# Patient Record
Sex: Female | Born: 1975 | Race: White | Hispanic: No | Marital: Married | Smoking: Never smoker
Health system: Southern US, Community
[De-identification: ages and names within clinical notes are randomized; demographics above are authoritative.]

## PROBLEM LIST (undated history)

## (undated) DIAGNOSIS — F410 Panic disorder [episodic paroxysmal anxiety] without agoraphobia: Secondary | ICD-10-CM

## (undated) DIAGNOSIS — F909 Attention-deficit hyperactivity disorder, unspecified type: Secondary | ICD-10-CM

## (undated) DIAGNOSIS — C50919 Malignant neoplasm of unspecified site of unspecified female breast: Secondary | ICD-10-CM

## (undated) DIAGNOSIS — I499 Cardiac arrhythmia, unspecified: Secondary | ICD-10-CM

## (undated) DIAGNOSIS — F32A Depression, unspecified: Secondary | ICD-10-CM

## (undated) DIAGNOSIS — I10 Essential (primary) hypertension: Secondary | ICD-10-CM

## (undated) DIAGNOSIS — F419 Anxiety disorder, unspecified: Secondary | ICD-10-CM

## (undated) DIAGNOSIS — G43909 Migraine, unspecified, not intractable, without status migrainosus: Secondary | ICD-10-CM

## (undated) DIAGNOSIS — E785 Hyperlipidemia, unspecified: Secondary | ICD-10-CM

## (undated) DIAGNOSIS — E079 Disorder of thyroid, unspecified: Secondary | ICD-10-CM

## (undated) HISTORY — DX: Essential (primary) hypertension: I10

## (undated) HISTORY — DX: Malignant neoplasm of unspecified site of unspecified female breast: C50.919

## (undated) HISTORY — DX: Migraine, unspecified, not intractable, without status migrainosus: G43.909

## (undated) HISTORY — DX: Disorder of thyroid, unspecified: E07.9

## (undated) HISTORY — PX: BREAST SURGERY: SHX581

## (undated) HISTORY — DX: Cardiac arrhythmia, unspecified: I49.9

## (undated) HISTORY — DX: Hyperlipidemia, unspecified: E78.5

---

## 1995-05-23 HISTORY — PX: CHOLECYSTECTOMY: SHX55

## 2016-01-17 ENCOUNTER — Telehealth: Payer: Self-pay | Admitting: Family Medicine

## 2016-01-17 ENCOUNTER — Ambulatory Visit (INDEPENDENT_AMBULATORY_CARE_PROVIDER_SITE_OTHER): Payer: Federal, State, Local not specified - PPO | Admitting: Family Medicine

## 2016-01-17 ENCOUNTER — Encounter: Payer: Self-pay | Admitting: Family Medicine

## 2016-01-17 VITALS — BP 126/82 | HR 101 | Temp 98.4°F | Wt 231.0 lb

## 2016-01-17 DIAGNOSIS — Z13220 Encounter for screening for lipoid disorders: Secondary | ICD-10-CM

## 2016-01-17 DIAGNOSIS — G932 Benign intracranial hypertension: Secondary | ICD-10-CM

## 2016-01-17 DIAGNOSIS — E669 Obesity, unspecified: Secondary | ICD-10-CM

## 2016-01-17 DIAGNOSIS — E039 Hypothyroidism, unspecified: Secondary | ICD-10-CM | POA: Diagnosis not present

## 2016-01-17 LAB — TSH: TSH: 4.11 u[IU]/mL (ref 0.35–4.50)

## 2016-01-17 LAB — COMPREHENSIVE METABOLIC PANEL
ALBUMIN: 4.2 g/dL (ref 3.5–5.2)
ALT: 17 U/L (ref 0–35)
AST: 18 U/L (ref 0–37)
Alkaline Phosphatase: 107 U/L (ref 39–117)
BILIRUBIN TOTAL: 0.2 mg/dL (ref 0.2–1.2)
BUN: 11 mg/dL (ref 6–23)
CALCIUM: 9.2 mg/dL (ref 8.4–10.5)
CHLORIDE: 102 meq/L (ref 96–112)
CO2: 28 mEq/L (ref 19–32)
CREATININE: 0.79 mg/dL (ref 0.40–1.20)
GFR: 84.67 mL/min (ref >60.00)
Glucose, Bld: 123 mg/dL — ABNORMAL HIGH (ref 70–99)
Potassium: 3.6 mEq/L (ref 3.5–5.1)
Sodium: 138 mEq/L (ref 135–145)
Total Protein: 7.8 g/dL (ref 6.0–8.3)

## 2016-01-17 LAB — CBC
HCT: 43.1 % (ref 36.0–46.0)
Hemoglobin: 14.8 g/dL (ref 12.0–15.0)
MCHC: 34.3 g/dL (ref 30.0–36.0)
MCV: 87.5 fl (ref 78.0–100.0)
Platelets: 389 10*3/uL (ref 150.0–400.0)
RBC: 4.93 Mil/uL (ref 3.87–5.11)
RDW: 13.3 % (ref 11.5–15.5)
WBC: 8.8 10*3/uL (ref 4.0–10.5)

## 2016-01-17 LAB — LIPID PANEL
CHOLESTEROL: 201 mg/dL — AB (ref 0–200)
HDL: 51.1 mg/dL (ref >39.00)
NonHDL: 149.62
TRIGLYCERIDES: 253 mg/dL — AB (ref 0.0–149.0)
Total CHOL/HDL Ratio: 4
VLDL: 50.6 mg/dL — ABNORMAL HIGH (ref 0.0–40.0)

## 2016-01-17 LAB — LDL CHOLESTEROL, DIRECT: LDL DIRECT: 121 mg/dL

## 2016-01-17 LAB — HEMOGLOBIN A1C: Hgb A1c MFr Bld: 5.1 % (ref 4.6–6.5)

## 2016-01-17 MED ORDER — PHENTERMINE HCL 37.5 MG PO CAPS: 37.5000 mg | ORAL_CAPSULE | Freq: Every day | ORAL | 0 refills | Status: DC

## 2016-01-17 NOTE — Assessment & Plan Note (Signed)
New problem (to me). Referring to Ophthalmology for detailed look at fundus. Continuing Diamox.

## 2016-01-17 NOTE — Telephone Encounter (Signed)
Pt was called to ask for clarification on family medical history.

## 2016-01-17 NOTE — Patient Instructions (Signed)
Continue your current medications.  Follow up in 3 months.  Take care  Dr. Lacinda Axon   Health Maintenance, Female Adopting a healthy lifestyle and getting preventive care can go a long way to promote health and wellness. Talk with your health care provider about what schedule of regular examinations is right for you. This is a good chance for you to check in with your provider about disease prevention and staying healthy. In between checkups, there are plenty of things you can do on your own. Experts have done a lot of research about which lifestyle changes and preventive measures are most likely to keep you healthy. Ask your health care provider for more information. WEIGHT AND DIET  Eat a healthy diet  Be sure to include plenty of vegetables, fruits, low-fat dairy products, and lean protein.  Do not eat a lot of foods high in solid fats, added sugars, or salt.  Get regular exercise. This is one of the most important things you can do for your health.  Most adults should exercise for at least 150 minutes each week. The exercise should increase your heart rate and make you sweat (moderate-intensity exercise).  Most adults should also do strengthening exercises at least twice a week. This is in addition to the moderate-intensity exercise.  Maintain a healthy weight  Body mass index (BMI) is a measurement that can be used to identify possible weight problems. It estimates body fat based on height and weight. Your health care provider can help determine your BMI and help you achieve or maintain a healthy weight.  For females 98 years of age and older:   A BMI below 18.5 is considered underweight.  A BMI of 18.5 to 24.9 is normal.  A BMI of 25 to 29.9 is considered overweight.  A BMI of 30 and above is considered obese.  Watch levels of cholesterol and blood lipids  You should start having your blood tested for lipids and cholesterol at 42 years of age, then have this test every 5  years.  You may need to have your cholesterol levels checked more often if:  Your lipid or cholesterol levels are high.  You are older than 42 years of age.  You are at high risk for heart disease.  CANCER SCREENING   Lung Cancer  Lung cancer screening is recommended for adults 61-46 years old who are at high risk for lung cancer because of a history of smoking.  A yearly low-dose CT scan of the lungs is recommended for people who:  Currently smoke.  Have quit within the past 15 years.  Have at least a 30-pack-year history of smoking. A pack year is smoking an average of one pack of cigarettes a day for 1 year.  Yearly screening should continue until it has been 15 years since you quit.  Yearly screening should stop if you develop a health problem that would prevent you from having lung cancer treatment.  Breast Cancer  Practice breast self-awareness. This means understanding how your breasts normally appear and feel.  It also means doing regular breast self-exams. Let your health care provider know about any changes, no matter how small.  If you are in your 20s or 30s, you should have a clinical breast exam (CBE) by a health care provider every 1-3 years as part of a regular health exam.  If you are 4 or older, have a CBE every year. Also consider having a breast X-ray (mammogram) every year.  If you have  a family history of breast cancer, talk to your health care provider about genetic screening.  If you are at high risk for breast cancer, talk to your health care provider about having an MRI and a mammogram every year.  Breast cancer gene (BRCA) assessment is recommended for women who have family members with BRCA-related cancers. BRCA-related cancers include:  Breast.  Ovarian.  Tubal.  Peritoneal cancers.  Results of the assessment will determine the need for genetic counseling and BRCA1 and BRCA2 testing. Cervical Cancer Your health care provider may  recommend that you be screened regularly for cancer of the pelvic organs (ovaries, uterus, and vagina). This screening involves a pelvic examination, including checking for microscopic changes to the surface of your cervix (Pap test). You may be encouraged to have this screening done every 3 years, beginning at age 21.  For women ages 30-65, health care providers may recommend pelvic exams and Pap testing every 3 years, or they may recommend the Pap and pelvic exam, combined with testing for human papilloma virus (HPV), every 5 years. Some types of HPV increase your risk of cervical cancer. Testing for HPV may also be done on women of any age with unclear Pap test results.  Other health care providers may not recommend any screening for nonpregnant women who are considered low risk for pelvic cancer and who do not have symptoms. Ask your health care provider if a screening pelvic exam is right for you.  If you have had past treatment for cervical cancer or a condition that could lead to cancer, you need Pap tests and screening for cancer for at least 20 years after your treatment. If Pap tests have been discontinued, your risk factors (such as having a new sexual partner) need to be reassessed to determine if screening should resume. Some women have medical problems that increase the chance of getting cervical cancer. In these cases, your health care provider may recommend more frequent screening and Pap tests. Colorectal Cancer  This type of cancer can be detected and often prevented.  Routine colorectal cancer screening usually begins at 42 years of age and continues through 42 years of age.  Your health care provider may recommend screening at an earlier age if you have risk factors for colon cancer.  Your health care provider may also recommend using home test kits to check for hidden blood in the stool.  A small camera at the end of a tube can be used to examine your colon directly  (sigmoidoscopy or colonoscopy). This is done to check for the earliest forms of colorectal cancer.  Routine screening usually begins at age 50.  Direct examination of the colon should be repeated every 5-10 years through 42 years of age. However, you may need to be screened more often if early forms of precancerous polyps or small growths are found. Skin Cancer  Check your skin from head to toe regularly.  Tell your health care provider about any new moles or changes in moles, especially if there is a change in a mole's shape or color.  Also tell your health care provider if you have a mole that is larger than the size of a pencil eraser.  Always use sunscreen. Apply sunscreen liberally and repeatedly throughout the day.  Protect yourself by wearing long sleeves, pants, a wide-brimmed hat, and sunglasses whenever you are outside. HEART DISEASE, DIABETES, AND HIGH BLOOD PRESSURE   High blood pressure causes heart disease and increases the risk of stroke. High   blood pressure is more likely to develop in:  People who have blood pressure in the high end of the normal range (130-139/85-89 mm Hg).  People who are overweight or obese.  People who are African American.  If you are 18-39 years of age, have your blood pressure checked every 3-5 years. If you are 40 years of age or older, have your blood pressure checked every year. You should have your blood pressure measured twice--once when you are at a hospital or clinic, and once when you are not at a hospital or clinic. Record the average of the two measurements. To check your blood pressure when you are not at a hospital or clinic, you can use:  An automated blood pressure machine at a pharmacy.  A home blood pressure monitor.  If you are between 55 years and 79 years old, ask your health care provider if you should take aspirin to prevent strokes.  Have regular diabetes screenings. This involves taking a blood sample to check your  fasting blood sugar level.  If you are at a normal weight and have a low risk for diabetes, have this test once every three years after 42 years of age.  If you are overweight and have a high risk for diabetes, consider being tested at a younger age or more often. PREVENTING INFECTION  Hepatitis B  If you have a higher risk for hepatitis B, you should be screened for this virus. You are considered at high risk for hepatitis B if:  You were born in a country where hepatitis B is common. Ask your health care provider which countries are considered high risk.  Your parents were born in a high-risk country, and you have not been immunized against hepatitis B (hepatitis B vaccine).  You have HIV or AIDS.  You use needles to inject street drugs.  You live with someone who has hepatitis B.  You have had sex with someone who has hepatitis B.  You get hemodialysis treatment.  You take certain medicines for conditions, including cancer, organ transplantation, and autoimmune conditions. Hepatitis C  Blood testing is recommended for:  Everyone born from 1945 through 1965.  Anyone with known risk factors for hepatitis C. Sexually transmitted infections (STIs)  You should be screened for sexually transmitted infections (STIs) including gonorrhea and chlamydia if:  You are sexually active and are younger than 42 years of age.  You are older than 42 years of age and your health care provider tells you that you are at risk for this type of infection.  Your sexual activity has changed since you were last screened and you are at an increased risk for chlamydia or gonorrhea. Ask your health care provider if you are at risk.  If you do not have HIV, but are at risk, it may be recommended that you take a prescription medicine daily to prevent HIV infection. This is called pre-exposure prophylaxis (PrEP). You are considered at risk if:  You are sexually active and do not regularly use condoms or  know the HIV status of your partner(s).  You take drugs by injection.  You are sexually active with a partner who has HIV. Talk with your health care provider about whether you are at high risk of being infected with HIV. If you choose to begin PrEP, you should first be tested for HIV. You should then be tested every 3 months for as long as you are taking PrEP.  PREGNANCY   If you are   If you are premenopausal and you may become pregnant, ask your health care provider about preconception counseling.  If you may become pregnant, take 400 to 800 micrograms (mcg) of folic acid every day.  If you want to prevent pregnancy, talk to your health care provider about birth control (contraception). OSTEOPOROSIS AND MENOPAUSE   Osteoporosis is a disease in which the bones lose minerals and strength with aging. This can result in serious bone fractures. Your risk for osteoporosis can be identified using a bone density scan.  If you are 65 years of age or older, or if you are at risk for osteoporosis and fractures, ask your health care provider if you should be screened.  Ask your health care provider whether you should take a calcium or vitamin D supplement to lower your risk for osteoporosis.  Menopause may have certain physical symptoms and risks.  Hormone replacement therapy may reduce some of these symptoms and risks. Talk to your health care provider about whether hormone replacement therapy is right for you.  HOME CARE INSTRUCTIONS   Schedule regular health, dental, and eye exams.  Stay current with your immunizations.   Do not use any tobacco products including cigarettes, chewing tobacco, or electronic cigarettes.  If you are pregnant, do not drink alcohol.  If you are breastfeeding, limit how much and how often you drink alcohol.  Limit alcohol intake to no more than 1 drink per day for nonpregnant women. One drink equals 12 ounces of beer, 5 ounces of wine, or 1 ounces of hard  liquor.  Do not use street drugs.  Do not share needles.  Ask your health care provider for help if you need support or information about quitting drugs.  Tell your health care provider if you often feel depressed.  Tell your health care provider if you have ever been abused or do not feel safe at home.   This information is not intended to replace advice given to you by your health care provider. Make sure you discuss any questions you have with your health care provider.   Document Released: 11/21/2010 Document Revised: 05/29/2014 Document Reviewed: 04/09/2013 Elsevier Interactive Patient Education 2016 Elsevier Inc.  

## 2016-01-17 NOTE — Assessment & Plan Note (Addendum)
Stable. TSH today. Continuing current synthroid dose (100 mcg).

## 2016-01-17 NOTE — Progress Notes (Signed)
Subjective:  Patient ID: Cassidy Hopkins, female    DOB: 10-27-1975  Age: 40 y.o. MRN: [id number]  CC: Establish care, New patient visit  HPI Cassidy Hopkins is a 40 y.o. female presents to the clinic today to establish care.  Issues are below.  Hypothyroidism  Has been stable on 100 mcg of Synthroid.  Needs labs today.  Pseudotumor cerebri  Patient reports a long-standing history of pseudotumor.  She states that it is managed fairly well with acetazolamide.  She states that recently she has had change in her vision. She states that her vision is worse than previous.  She continues to have intermittent headaches.  She states that she saw an ophthalmologist approximately year ago.  Obesity  Patient states that she continues to battle obesity.  She has lost a significant amount of weight but is still not at her goal of 200 pounds.  She was previously on phentermine with a good response. She was on this for 12 weeks.  She would like to discuss restarting today.  PMH, Surgical Hx, Family Hx, Social History reviewed and updated as below.  Past Medical History:  Diagnosis Date  . Migraines   . Thyroid disease    Past Surgical History:  Procedure Laterality Date  . CESAREAN SECTION  2009, 2014, 2016  . CHOLECYSTECTOMY  1997   Family History  Problem Relation Age of Onset  . Hypertension Mother   . Hyperlipidemia Mother   . Hyperlipidemia Father   . Heart disease Father   . Hypertension Father   . Diabetes Father   . Colon cancer Maternal Grandmother   . Hyperlipidemia Maternal Grandmother    Social History  Substance Use Topics  . Smoking status: Never Smoker  . Smokeless tobacco: Never Used  . Alcohol use No   Review of Systems  Eyes: Positive for visual disturbance.  Neurological: Positive for headaches.  All other systems reviewed and are negative.  Objective:   Today's Vitals: BP 126/82 (BP Location: Right Arm, Patient Position: Sitting, Cuff Size:  Normal)   Pulse (!) 101   Temp 98.4 F (36.9 C) (Oral)   Wt 231 lb (104.8 kg)   SpO2 100%   Physical Exam  Constitutional: She is oriented to person, place, and time.  Obese female in NAD.   HENT:  Head: Normocephalic and atraumatic.  Mouth/Throat: Oropharynx is clear and moist.  Eyes: Conjunctivae are normal. Right eye exhibits no discharge. Left eye exhibits no discharge.  Neck: Neck supple. No thyromegaly present.  Cardiovascular: Normal rate and regular rhythm.   Pulmonary/Chest: Effort normal. She has no wheezes. She has no rales.  Abdominal: Soft. She exhibits no distension. There is no tenderness. There is no rebound and no guarding.  Musculoskeletal: Normal range of motion. She exhibits no edema.  Neurological: She is alert and oriented to person, place, and time.  Skin: Skin is warm and dry. No rash noted.  Psychiatric: She has a normal mood and affect.  Vitals reviewed.  Assessment & Plan:   Problem List Items Addressed This Visit    Hypothyroidism - Primary    Stable. TSH today. Continuing current synthroid dose (100 mcg).      Relevant Medications   levothyroxine (SYNTHROID, LEVOTHROID) 100 MCG tablet   Other Relevant Orders   TSH   Pseudotumor cerebri    New problem (to me). Referring to Ophthalmology for detailed look at fundus. Continuing Diamox.      Relevant Medications   acetaZOLAMIDE (DIAMOX) 250 MG  tablet   hydrochlorothiazide (HYDRODIURIL) 25 MG tablet   Other Relevant Orders   Ambulatory referral to Ophthalmology   CBC   Comp Met (CMET)   HgB A1c   Obesity    New problem (to me). After lengthy discussion, will proceed with additional 12 weeks of phentermine. Advised healthy diet and exercise. Patient has joined [employer].      Relevant Medications   phentermine 37.5 MG capsule    Other Visit Diagnoses    Screening, lipid       Relevant Orders   Lipid Profile      Outpatient Encounter Prescriptions as of 01/17/2016  Medication  Sig  . acetaZOLAMIDE (DIAMOX) 250 MG tablet TAKE ONE TABLET (250 MG TOTAL) BY MOUTH DAILY.  . cetirizine (ZYRTEC) 10 MG tablet Take by mouth.  . hydrochlorothiazide (HYDRODIURIL) 25 MG tablet TAKE ONE TABLET (25 MG TOTAL) BY MOUTH DAILY.  Marland Kitchen levothyroxine (SYNTHROID, LEVOTHROID) 100 MCG tablet Take by mouth.  . phentermine 37.5 MG capsule Take 1 capsule (37.5 mg total) by mouth daily.   No facility-administered encounter medications on file as of 01/17/2016.     Follow-up: 3 months.  [place]

## 2016-01-17 NOTE — Assessment & Plan Note (Signed)
New problem (to me). After lengthy discussion, will proceed with additional 12 weeks of phentermine. Advised healthy diet and exercise. Patient has joined [employer].

## 2016-01-18 ENCOUNTER — Other Ambulatory Visit: Payer: Self-pay | Admitting: Family Medicine

## 2016-01-18 MED ORDER — LEVOTHYROXINE SODIUM 112 MCG PO TABS: 112.0000 ug | ORAL_TABLET | Freq: Every day | ORAL | 0 refills | Status: DC

## 2016-01-21 DIAGNOSIS — J029 Acute pharyngitis, unspecified: Secondary | ICD-10-CM | POA: Diagnosis not present

## 2016-01-21 DIAGNOSIS — R030 Elevated blood-pressure reading, without diagnosis of hypertension: Secondary | ICD-10-CM | POA: Diagnosis not present

## 2016-01-21 DIAGNOSIS — R358 Other polyuria: Secondary | ICD-10-CM | POA: Diagnosis not present

## 2016-01-25 ENCOUNTER — Encounter: Payer: Self-pay | Admitting: Family Medicine

## 2016-02-16 ENCOUNTER — Other Ambulatory Visit: Payer: Self-pay | Admitting: Ophthalmology

## 2016-02-16 DIAGNOSIS — H4710 Unspecified papilledema: Secondary | ICD-10-CM

## 2016-02-16 DIAGNOSIS — G932 Benign intracranial hypertension: Secondary | ICD-10-CM | POA: Diagnosis not present

## 2016-03-31 ENCOUNTER — Encounter: Payer: Self-pay | Admitting: Family Medicine

## 2016-03-31 ENCOUNTER — Ambulatory Visit (INDEPENDENT_AMBULATORY_CARE_PROVIDER_SITE_OTHER): Payer: Federal, State, Local not specified - PPO | Admitting: Family Medicine

## 2016-03-31 VITALS — BP 130/94 | HR 92 | Temp 97.5°F | Resp 14 | Ht 69.0 in | Wt 240.2 lb

## 2016-03-31 DIAGNOSIS — E039 Hypothyroidism, unspecified: Secondary | ICD-10-CM

## 2016-03-31 DIAGNOSIS — F411 Generalized anxiety disorder: Secondary | ICD-10-CM

## 2016-03-31 DIAGNOSIS — E669 Obesity, unspecified: Secondary | ICD-10-CM

## 2016-03-31 LAB — T4, FREE: FREE T4: 1 ng/dL (ref 0.8–1.8)

## 2016-03-31 LAB — TSH: TSH: 2.49 m[IU]/L

## 2016-03-31 MED ORDER — BUPROPION HCL ER (XL) 150 MG PO TB24: 150.0000 mg | ORAL_TABLET | Freq: Every day | ORAL | 0 refills | Status: DC

## 2016-03-31 NOTE — Assessment & Plan Note (Signed)
Worsening. Encouraged continued exercise and healthy balanced diet (low carb, higher protein). Wellbutrin for anxiety (hopefully will aid in weight loss as well).

## 2016-03-31 NOTE — Progress Notes (Signed)
Pre visit review using our clinic review tool, if applicable. No additional management support is needed unless otherwise documented below in the visit note. 

## 2016-03-31 NOTE — Patient Instructions (Signed)
Try the Wellbutrin and see how you do.  Send a message in ~ 6 weeks.  Take care  Dr. Lacinda Axon

## 2016-03-31 NOTE — Progress Notes (Signed)
Subjective:  Patient ID: Cassidy Hopkins, female    DOB: Nov 28, 1975  Age: 40 y.o. MRN: [id number]  CC: Anxiety, Obesity  HPI:  40 year old female with a history of pseudotumor, obesity, hypothyroidism presents with the above complaints/issues.  Anxiety  Patient reports a long-standing history of anxiety. She has never reported this to me.  She has never had this addressed.  She states that she feels anxious on a regular basis.  She states that her siblings suffer from anxiety as well.  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  She would like to discuss treatment options today.  Obesity  At our last visit, patient discussed with me the possibility of starting phentermine. I was in agreement for short course given her positive response in the past.  She did not take the medication as she was concerned about side effects.  She states that she is gaining weight and still struggling.   She is exercising at a local gym 3 times a week.  She states that she often makes bad choices regarding food intake. This seems to be the likely culprit for her weight gain.  She reports associated joint pain.  She would like to discuss alternate treatment options today.  Social Hx   Social History   Social History  . Marital status: Married    Spouse name: N/A  . Number of children: N/A  . Years of education: N/A   Social History Main Topics  . Smoking status: Never Smoker  . Smokeless tobacco: Never Used  . Alcohol use No  . Drug use: No  . Sexual activity: Not Asked   Other Topics Concern  . None   Social History Narrative  . None    Review of Systems  Constitutional:       Weight gain.  Psychiatric/Behavioral: The patient is nervous/anxious.    Objective:  BP (!) 130/94 (BP Location: Right Arm, Patient Position: Sitting, Cuff Size: Large)   Pulse 92   Temp 97.5 F (36.4 C) (Oral)   Resp 14   Wt 240 lb 4  oz (109 kg)   SpO2 98%   BP/Weight 03/31/2016 0000000  Systolic BP [id number]  Diastolic BP 94 82  Wt. (Lbs) 240.25 231    Physical Exam  Constitutional: She is oriented to person, place, and time. She appears well-developed. No distress.  Cardiovascular: Normal rate and regular rhythm.   Pulmonary/Chest: Effort normal and breath sounds normal.  Neurological: She is alert and oriented to person, place, and time.  Psychiatric: She has a normal mood and affect.  Vitals reviewed.  Lab Results  Component Value Date   WBC 8.8 01/17/2016   HGB 14.8 01/17/2016   HCT 43.1 01/17/2016   PLT 389.0 01/17/2016   GLUCOSE 123 (H) 01/17/2016   CHOL 201 (H) 01/17/2016   TRIG 253.0 (H) 01/17/2016   HDL 51.10 01/17/2016   LDLDIRECT 121.0 01/17/2016   ALT 17 01/17/2016   AST 18 01/17/2016   NA 138 01/17/2016   K 3.6 01/17/2016   CL 102 01/17/2016   CREATININE 0.79 01/17/2016   BUN 11 01/17/2016   CO2 28 01/17/2016   TSH 4.11 01/17/2016   HGBA1C 5.1 01/17/2016   Assessment & Plan:   Problem List Items Addressed This Visit    Obesity (BMI 30-39.9)    Worsening. Encouraged continued exercise and healthy balanced diet (low carb, higher protein). Wellbutrin for anxiety (hopefully will aid in weight  loss as well).      Hypothyroidism   Relevant Orders   TSH   T4, free   Generalized anxiety disorder - Primary    New problem. After discussion, patient elected to proceed with pharmacologic treatment. Starting Wellbutrin as it may aid in weight loss as well.        Meds ordered this encounter  Medications  . buPROPion (WELLBUTRIN XL) 150 MG 24 hr tablet    Sig: Take 1 tablet (150 mg total) by mouth daily. May increase to 300 mg after 1 week.    Dispense:  90 tablet    Refill:  0    Follow-up: Return in about 6 months (around 09/28/2016).  Start

## 2016-03-31 NOTE — Assessment & Plan Note (Signed)
New problem. After discussion, patient elected to proceed with pharmacologic treatment. Starting Wellbutrin as it may aid in weight loss as well.

## 2016-04-04 ENCOUNTER — Telehealth: Payer: Self-pay | Admitting: *Deleted

## 2016-04-04 ENCOUNTER — Ambulatory Visit: Payer: Federal, State, Local not specified - PPO

## 2016-04-04 NOTE — Telephone Encounter (Signed)
Patient requested lab results Pt contact  [phone]

## 2016-04-04 NOTE — Telephone Encounter (Signed)
Pt called with results

## 2016-04-14 ENCOUNTER — Other Ambulatory Visit: Payer: Self-pay | Admitting: Family Medicine

## 2016-04-22 DIAGNOSIS — J0100 Acute maxillary sinusitis, unspecified: Secondary | ICD-10-CM | POA: Diagnosis not present

## 2016-04-22 DIAGNOSIS — R0982 Postnasal drip: Secondary | ICD-10-CM | POA: Diagnosis not present

## 2016-05-01 ENCOUNTER — Telehealth: Payer: Self-pay | Admitting: Family Medicine

## 2016-05-01 NOTE — Telephone Encounter (Signed)
Pt stated you all talked about contrave.

## 2016-05-01 NOTE — Telephone Encounter (Signed)
What medication is she referring to?

## 2016-05-01 NOTE — Telephone Encounter (Signed)
Recommendations?

## 2016-05-01 NOTE — Telephone Encounter (Signed)
Pt saw Dr. Lacinda Axon last month for anxiety/weight loss. Pt was put on buPROPion (WELLBUTRIN XL) 150 MG 24 hr tablet. Anxiety is fine but would like to try the other medication Dr. Lacinda Axon suggested. Please call pt at [phone].

## 2016-05-02 ENCOUNTER — Telehealth: Payer: Self-pay | Admitting: Family Medicine

## 2016-05-02 ENCOUNTER — Other Ambulatory Visit: Payer: Self-pay | Admitting: Family Medicine

## 2016-05-02 MED ORDER — NALTREXONE-BUPROPION HCL ER 8-90 MG PO TB12: ORAL_TABLET | ORAL | 0 refills | Status: DC

## 2016-05-02 NOTE — Telephone Encounter (Signed)
Pt called and told to pick up rx at the front.

## 2016-05-02 NOTE — Progress Notes (Signed)
refilled 

## 2016-05-02 NOTE — Telephone Encounter (Signed)
Stop Wellbutrin. She can pick up Rx for Contrave.

## 2016-05-02 NOTE — Telephone Encounter (Signed)
The patient called to see if there was a cheaper alternative for contrave . She went to pick up her prescription the pharmacy and it was 150.00 for a one month supply.

## 2016-05-02 NOTE — Telephone Encounter (Signed)
Pt was called and todl that alternatives would still be expensive. Pt understood and stated she would just continue wellbutrin.

## 2016-05-12 ENCOUNTER — Other Ambulatory Visit: Payer: Self-pay | Admitting: Family Medicine

## 2016-06-19 ENCOUNTER — Other Ambulatory Visit: Payer: Self-pay | Admitting: Family Medicine

## 2016-06-19 DIAGNOSIS — K080 Exfoliation of teeth due to systemic causes: Secondary | ICD-10-CM | POA: Diagnosis not present

## 2016-06-28 ENCOUNTER — Telehealth: Payer: Self-pay | Admitting: Family Medicine

## 2016-06-28 NOTE — Telephone Encounter (Signed)
I approve

## 2016-06-28 NOTE — Telephone Encounter (Signed)
Patient would like to switch her [place] PCP from [facility], to Dr. Briscoe Deutscher of [place].   Please reply to confirm that the patients requested can be satisfied.   She want's to schedule with Dr. Juleen China for next week Tuesday the 13th of February.  -LL

## 2016-06-29 NOTE — Telephone Encounter (Signed)
Ok with me 

## 2016-06-29 NOTE — Telephone Encounter (Signed)
Good day.  Ms. Morine just called back today to try to schedule an appt w/ Dr. Juleen China at [facility].  I let her know we are pending acknowledgement from Dr. Lacinda Axon.  Thanks.  -LL

## 2016-07-04 ENCOUNTER — Encounter: Payer: Self-pay | Admitting: Family Medicine

## 2016-07-04 ENCOUNTER — Ambulatory Visit (INDEPENDENT_AMBULATORY_CARE_PROVIDER_SITE_OTHER): Payer: Federal, State, Local not specified - PPO | Admitting: Family Medicine

## 2016-07-04 VITALS — BP 114/82 | HR 83 | Temp 97.5°F | Ht 69.0 in | Wt 241.2 lb

## 2016-07-04 DIAGNOSIS — G932 Benign intracranial hypertension: Secondary | ICD-10-CM

## 2016-07-04 DIAGNOSIS — E039 Hypothyroidism, unspecified: Secondary | ICD-10-CM | POA: Diagnosis not present

## 2016-07-04 DIAGNOSIS — D179 Benign lipomatous neoplasm, unspecified: Secondary | ICD-10-CM | POA: Diagnosis not present

## 2016-07-04 DIAGNOSIS — E669 Obesity, unspecified: Secondary | ICD-10-CM

## 2016-07-04 DIAGNOSIS — Z9851 Tubal ligation status: Secondary | ICD-10-CM

## 2016-07-04 LAB — LIPID PANEL
Cholesterol: 220 mg/dL — ABNORMAL HIGH (ref 0–200)
HDL: 56.6 mg/dL (ref >39.00)
LDL Cholesterol: 133 mg/dL — ABNORMAL HIGH (ref 0–99)
NonHDL: 163.28
Total CHOL/HDL Ratio: 4
Triglycerides: 150 mg/dL — ABNORMAL HIGH (ref 0.0–149.0)
VLDL: 30 mg/dL (ref 0.0–40.0)

## 2016-07-04 LAB — TSH: TSH: 6.5 u[IU]/mL — ABNORMAL HIGH (ref 0.35–4.50)

## 2016-07-04 LAB — COMPREHENSIVE METABOLIC PANEL
ALT: 15 U/L (ref 0–35)
AST: 22 U/L (ref 0–37)
Albumin: 4.2 g/dL (ref 3.5–5.2)
Alkaline Phosphatase: 102 U/L (ref 39–117)
BUN: 13 mg/dL (ref 6–23)
CO2: 26 mEq/L (ref 19–32)
Calcium: 9.3 mg/dL (ref 8.4–10.5)
Chloride: 104 mEq/L (ref 96–112)
Creatinine, Ser: 0.75 mg/dL (ref 0.40–1.20)
GFR: 89.71 mL/min (ref >60.00)
Glucose, Bld: 92 mg/dL (ref 70–99)
Potassium: 3.3 mEq/L — ABNORMAL LOW (ref 3.5–5.1)
Sodium: 137 mEq/L (ref 135–145)
Total Bilirubin: 0.5 mg/dL (ref 0.2–1.2)
Total Protein: 7.4 g/dL (ref 6.0–8.3)

## 2016-07-04 LAB — CBC WITH DIFFERENTIAL/PLATELET
Basophils Absolute: 0.1 10*3/uL (ref 0.0–0.1)
Basophils Relative: 1 % (ref 0.0–3.0)
Eosinophils Absolute: 0.3 10*3/uL (ref 0.0–0.7)
Eosinophils Relative: 4.5 % (ref 0.0–5.0)
HCT: 44.1 % (ref 36.0–46.0)
Hemoglobin: 14.9 g/dL (ref 12.0–15.0)
Lymphocytes Relative: 28.9 % (ref 12.0–46.0)
Lymphs Abs: 1.9 10*3/uL (ref 0.7–4.0)
MCHC: 33.8 g/dL (ref 30.0–36.0)
MCV: 87.5 fl (ref 78.0–100.0)
Monocytes Absolute: 0.4 10*3/uL (ref 0.1–1.0)
Monocytes Relative: 5.6 % (ref 3.0–12.0)
Neutro Abs: 3.9 10*3/uL (ref 1.4–7.7)
Neutrophils Relative %: 60 % (ref 43.0–77.0)
Platelets: 327 10*3/uL (ref 150.0–400.0)
RBC: 5.04 Mil/uL (ref 3.87–5.11)
RDW: 13.3 % (ref 11.5–15.5)
WBC: 6.5 10*3/uL (ref 4.0–10.5)

## 2016-07-04 LAB — T4, FREE: Free T4: 0.79 ng/dL (ref 0.60–1.60)

## 2016-07-04 MED ORDER — PHENTERMINE HCL 37.5 MG PO TABS: 37.5000 mg | ORAL_TABLET | Freq: Every day | ORAL | 0 refills | Status: DC

## 2016-07-04 NOTE — Patient Instructions (Signed)
Start taking vitamin D 4000 IU daily.  Start taking B12 daily.

## 2016-07-04 NOTE — Progress Notes (Signed)
Pre visit review using our clinic review tool, if applicable. No additional management support is needed unless otherwise documented below in the visit note. 

## 2016-07-06 ENCOUNTER — Encounter: Payer: Self-pay | Admitting: Family Medicine

## 2016-07-06 DIAGNOSIS — Z9851 Tubal ligation status: Secondary | ICD-10-CM | POA: Insufficient documentation

## 2016-07-06 NOTE — Progress Notes (Addendum)
Cassidy Hopkins is a 41 y.o. female is here to [facility].   History of Present Illness:    1. Obesity (BMI 30-39.9). Patient has noted a weight gain of approximately 40-60 pounds over the last a few years. She feels that her ideal weight is 160 pounds.     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 Exercise: Gym 3x/week.  Diet: "Out of control," daytime grazer, craves sugar. Quit sugar 2 months ago.   Past History for Obesity: History of eating disorders: none. There is a family history positive for obesity in the patient.  Previous treatments for obesity include nutritionist consultation, prescription appetite suppressants: Contrave, Phentermine, self-directed dieting and very low calorie diet.   Obesity associated medical conditions: thyroid disease. Obesity associated medications: none. Cardiovascular risk factors besides obesity: FamHx DM, HTN.  Body mass index is 35.62 kg/m. Ht:5\' 9"  (1.753 m) Wt:241 lb 3.2 oz (109.4 kg) [id number] surface area is 2.31 meters squared.     PMHx, SurgHx, SocialHx, Medications, and Allergies were reviewed in the Visit Navigator and updated as appropriate.    Past Medical History:  Diagnosis Date  . Breast cancer ([place]) 1997  . Hyperlipidemia   . Hypertension   . Irregular heart rate   . Migraines   . Thyroid disease     Past Surgical History:  Procedure Laterality Date  . BREAST SURGERY    . CESAREAN SECTION  2009, 2014, 2016  . CHOLECYSTECTOMY  1997    Family History  Problem Relation Age of Onset  . Hypertension Mother   . Hyperlipidemia Mother   . Hyperlipidemia Father   . Heart disease Father   . Hypertension Father   . Diabetes Father   . Colon cancer Maternal Grandmother   . Hyperlipidemia Maternal Grandmother     Social History  Substance Use Topics  . Smoking status: Never Smoker  . Smokeless tobacco: Never Used  . Alcohol use No     Current Medications and Allergies:    Current Outpatient  Prescriptions:  .  acetaZOLAMIDE (DIAMOX) 250 MG tablet, TAKE ONE TABLET (250 MG TOTAL) BY MOUTH DAILY., Disp: , Rfl:  .  cetirizine (ZYRTEC) 10 MG tablet, Take by mouth., Disp: , Rfl:  .  hydrochlorothiazide (HYDRODIURIL) 25 MG tablet, TAKE ONE TABLET (25 MG TOTAL) BY MOUTH DAILY., Disp: , Rfl:  .  levothyroxine (SYNTHROID, LEVOTHROID) 112 MCG tablet, TAKE 1 TABLET (112 MCG TOTAL) BY MOUTH DAILY., Disp: 90 tablet, Rfl: 3    Allergies  Allergen Reactions  . Penicillins Rash and Shortness Of Breath  . Topiramate Anxiety      Patient Information Form: Screening and ROS    Do you feel safe in relationships? yes PHQ-2: negative  Review of Systems  General:  Negative for unexplained weight loss, fever Skin: Negative for new or changing mole, sore that won't heal HEENT: Negative for trouble hearing, trouble seeing, ringing in ears, mouth sores, hoarseness, change in voice, dysphagia CV:  Negative for chest pain, dyspnea, edema, palpitations Resp: Negative for cough, dyspnea, hemoptysis GI: Negative for nausea, vomiting, diarrhea, constipation, abdominal pain, melena, hematochezia GU: Negative for dysuria, incontinence, urinary hesitance, hematuria, vaginal or penile discharge, polyuria, sexual difficulty, lumps in testicle or breasts MSK: Negative for muscle cramps or aches, joint pain or swelling Neuro: Negative for headaches, weakness, numbness, dizziness, passing out/fainting Psych: Negative for depression, anxiety, memory problems   Vitals:   Vitals:   07/04/16 1004  BP: 114/82  Pulse: 83  Temp: 97.5 F (36.4 C)  TempSrc: Oral  SpO2: 98%  Weight: 241 lb 3.2 oz (109.4 kg)  Height: 5\' 9"  (1.753 m)     Body mass index is 35.62 kg/m.   Physical Exam:     General: Alert, cooperative, appears stated age and no distress.  HEENT:  Normocephalic, without obvious abnormality, atraumatic. Conjunctivae/corneas clear. PERRL, EOM's intact. Normal TM's and external ear canals  both ears. Nares normal. Septum midline. Mucosa normal. No drainage or sinus tenderness. Lips, mucosa, and tongue normal; teeth and gums normal.  Lungs: Clear to auscultation bilaterally.  Heart:: Regular rate and rhythm, S1, S2 normal, no murmur, click, rub or gallop.  Abdomen: Soft, non-tender; bowel sounds normal; no masses,  no organomegaly.  Extremities: Extremities normal, atraumatic, no cyanosis or edema.  Pulses: 2+ and symmetric.  Skin: Skin color, texture, turgor normal. No rashes or lesions.  Neurologic: Alert and oriented X 3, normal strength and tone. Normal symmetric. reflexes. Normal coordination and gait.  Psych: Alert,oriented, in NAD with a full range of affect, normal behavior and no psychotic features    Results for orders placed or performed in visit on 07/04/16  CBC with Differential/Platelet  Result Value Ref Range   WBC 6.5 4.0 - 10.5 K/uL   RBC 5.04 3.87 - 5.11 Mil/uL   Hemoglobin 14.9 12.0 - 15.0 g/dL   HCT 44.1 36.0 - 46.0 %   MCV 87.5 78.0 - 100.0 fl   MCHC 33.8 30.0 - 36.0 g/dL   RDW 13.3 11.5 - 15.5 %   Platelets 327.0 150.0 - 400.0 K/uL   Neutrophils Relative % 60.0 43.0 - 77.0 %   Lymphocytes Relative 28.9 12.0 - 46.0 %   Monocytes Relative 5.6 3.0 - 12.0 %   Eosinophils Relative 4.5 0.0 - 5.0 %   Basophils Relative 1.0 0.0 - 3.0 %   Neutro Abs 3.9 1.4 - 7.7 K/uL   Lymphs Abs 1.9 0.7 - 4.0 K/uL   Monocytes Absolute 0.4 0.1 - 1.0 K/uL   Eosinophils Absolute 0.3 0.0 - 0.7 K/uL   Basophils Absolute 0.1 0.0 - 0.1 K/uL  Comprehensive metabolic panel  Result Value Ref Range   Sodium 137 135 - 145 mEq/L   Potassium 3.3 (L) 3.5 - 5.1 mEq/L   Chloride 104 96 - 112 mEq/L   CO2 26 19 - 32 mEq/L   Glucose, Bld 92 70 - 99 mg/dL   BUN 13 6 - 23 mg/dL   Creatinine, Ser 0.75 0.40 - 1.20 mg/dL   Total Bilirubin 0.5 0.2 - 1.2 mg/dL   Alkaline Phosphatase 102 39 - 117 U/L   AST 22 0 - 37 U/L   ALT 15 0 - 35 U/L   Total Protein 7.4 6.0 - 8.3 g/dL   Albumin  4.2 3.5 - 5.2 g/dL   Calcium 9.3 8.4 - 10.5 mg/dL   GFR 89.71 >60.00 mL/min  Lipid panel  Result Value Ref Range   Cholesterol 220 (H) 0 - 200 mg/dL   Triglycerides 150.0 (H) 0.0 - 149.0 mg/dL   HDL 56.60 >39.00 mg/dL   VLDL 30.0 0.0 - 40.0 mg/dL   LDL Cholesterol 133 (H) 0 - 99 mg/dL   Total CHOL/HDL Ratio 4    NonHDL 163.28   T4, free  Result Value Ref Range   Free T4 0.79 0.60 - 1.60 ng/dL  TSH  Result Value Ref Range   TSH 6.50 (H) 0.35 - 4.50 uIU/mL  Assessment and Plan:    Cassidy Hopkins was seen today for establish care.  Diagnoses and all orders for this visit:  Obesity (BMI 30-39.9) Comments: Reviewed weight loss options at length. Patient would like to start with Phentermine and self-directed exercise/diet changes. Will recheck in one month.  Orders: -     phentermine (ADIPEX-P) 37.5 MG tablet; Take 1 tablet (37.5 mg total) by mouth daily before breakfast. -     Insulin, Free (Bioactive) -     CBC with Differential/Platelet -     Comprehensive metabolic panel -     Lipid panel  Acquired hypothyroidism -     T4, free -     TSH  . Reviewed expectations re: course of current medical issues. . Discussed self-management of symptoms. . Outlined signs and symptoms indicating need for more acute intervention. . Patient verbalized understanding and all questions were answered. . See orders for this visit as documented in the electronic medical record. . Patient received an After Visit Summary.  Records requested if needed. I spent 45 minutes with this patient, greater than 50% was face-to-face time counseling regarding the above diagnoses.   Briscoe Deutscher, [place], [place] 07/06/2016   Follow-up: No Follow-up on file.  Meds ordered this encounter  Medications  . phentermine (ADIPEX-P) 37.5 MG tablet    Sig: Take 1 tablet (37.5 mg total) by mouth daily before breakfast.    Dispense:  30 tablet    Refill:  0   Medications Discontinued During This  Encounter  Medication Reason  . Naltrexone-Bupropion HCl ER 8-90 MG TB12 Error  . buPROPion (WELLBUTRIN XL) 150 MG 24 hr tablet    Orders Placed This Encounter  Procedures  . CBC with Differential/Platelet  . Comprehensive metabolic panel  . Lipid panel  . T4, free  . TSH  . Insulin, Free (Bioactive)

## 2016-07-07 ENCOUNTER — Encounter: Payer: Self-pay | Admitting: Family Medicine

## 2016-07-07 ENCOUNTER — Other Ambulatory Visit: Payer: Self-pay

## 2016-07-07 ENCOUNTER — Telehealth: Payer: Self-pay | Admitting: Surgical

## 2016-07-07 DIAGNOSIS — D179 Benign lipomatous neoplasm, unspecified: Secondary | ICD-10-CM | POA: Insufficient documentation

## 2016-07-07 LAB — INSULIN, FREE (BIOACTIVE): Insulin, Free: 6.8 u[IU]/mL (ref 1.5–14.9)

## 2016-07-07 MED ORDER — SERTRALINE HCL 50 MG PO TABS: 50.0000 mg | ORAL_TABLET | Freq: Every evening | ORAL | 3 refills | Status: DC

## 2016-07-07 NOTE — Telephone Encounter (Signed)
LM for patient to return call to discuss my chart message.

## 2016-07-07 NOTE — Telephone Encounter (Signed)
Spoke with patient and she said that she is having a lot of anxiety right now. She said that she is not wanting to be in social situations. I advised that Dr. Juleen China discussed putting her on Zoloft. I also informed her that it can cause drowsiness and she should be careful if driving.

## 2016-07-08 NOTE — Telephone Encounter (Signed)
Spoke with Water quality scientist, [place] - okay to call in [place]. Completed on 07/07/16. Will check in at next appointment (already scheduled for 3-4 weeks).

## 2016-07-31 ENCOUNTER — Other Ambulatory Visit: Payer: Self-pay | Admitting: Family Medicine

## 2016-07-31 DIAGNOSIS — E669 Obesity, unspecified: Secondary | ICD-10-CM

## 2016-07-31 MED ORDER — PHENTERMINE HCL 37.5 MG PO TABS: 37.5000 mg | ORAL_TABLET | Freq: Every day | ORAL | 0 refills | Status: DC

## 2016-08-01 ENCOUNTER — Ambulatory Visit: Payer: Federal, State, Local not specified - PPO | Admitting: Family Medicine

## 2016-08-02 ENCOUNTER — Ambulatory Visit (INDEPENDENT_AMBULATORY_CARE_PROVIDER_SITE_OTHER): Payer: Federal, State, Local not specified - PPO | Admitting: Family Medicine

## 2016-08-02 ENCOUNTER — Encounter: Payer: Self-pay | Admitting: Family Medicine

## 2016-08-02 VITALS — BP 124/84 | HR 100 | Temp 97.9°F | Ht 69.0 in | Wt 236.0 lb

## 2016-08-02 DIAGNOSIS — E669 Obesity, unspecified: Secondary | ICD-10-CM

## 2016-08-02 DIAGNOSIS — F411 Generalized anxiety disorder: Secondary | ICD-10-CM

## 2016-08-02 MED ORDER — PHENTERMINE HCL 37.5 MG PO TABS: 37.5000 mg | ORAL_TABLET | Freq: Every day | ORAL | 0 refills | Status: DC

## 2016-08-02 MED ORDER — BUSPIRONE HCL 7.5 MG PO TABS: 7.5000 mg | ORAL_TABLET | Freq: Two times a day (BID) | ORAL | 2 refills | Status: DC

## 2016-08-02 NOTE — Patient Instructions (Signed)
Keep up the good work!  Buspar - start with one pill once daily for at least one week to see how it makes you feel. Then, you may increase to twice daily.

## 2016-08-02 NOTE — Progress Notes (Signed)
Cassidy Hopkins is a 41 y.o. female is here to discuss:  History of Present Illness:   Chief Complaint  Patient presents with  . Follow-up  . Weight Check    Patient is on Phentermine. Tolerating well.  Has lost 5 pounds in one month.   HPI:  1. Obesity (BMI 30-39.9): Tolerating medication without side effects. Following low carbohydrate diet 50% of the time. No exercise right now.  Under a lot of stress right now.    2. Generalized anxiety disorder: Tried Zoloft. Had hallucinations. Significant stress in her life now. Daughter and mother with health issues. No SI/HI.    There are no preventive care reminders to display for this patient.  PMHx, SurgHx, SocialHx, FamHx, Medications, and Allergies were reviewed in the Visit Navigator and updated as appropriate.   Patient Active Problem List   Diagnosis Date Noted  . Multiple lipomas 07/07/2016  . Hx of tubal ligation 07/06/2016  . Generalized anxiety disorder 03/31/2016  . Hypothyroidism 01/17/2016  . Pseudotumor cerebri 01/17/2016  . Obesity (BMI 30-39.9) 01/17/2016    Social History  Substance Use Topics  . Smoking status: Never Smoker  . Smokeless tobacco: Never Used  . Alcohol use No    Current Medications and Allergies:   .  acetaZOLAMIDE (DIAMOX) 250 MG tablet, TAKE ONE TABLET (250 MG TOTAL) BY MOUTH DAILY., Disp: , Rfl:  .  cetirizine (ZYRTEC) 10 MG tablet, Take by mouth., Disp: , Rfl:  .  hydrochlorothiazide (HYDRODIURIL) 25 MG tablet, TAKE ONE TABLET (25 MG TOTAL) BY MOUTH DAILY., Disp: , Rfl:  .  levothyroxine (SYNTHROID, LEVOTHROID) 112 MCG tablet, TAKE 1 TABLET (112 MCG TOTAL) BY MOUTH DAILY., Disp: 90 tablet, Rfl: 3 .  phentermine (ADIPEX-P) 37.5 MG tablet, Take 1 tablet (37.5 mg total) by mouth daily before breakfast., Disp: 30 tablet, Rfl: 0   Allergies  Allergen Reactions  . Penicillins Rash and Shortness Of Breath  . Topiramate Anxiety    Review of Systems   Review of Systems  Constitutional:  Negative for chills and fever.  HENT: Negative for ear pain, sinus pain and sore throat.   Eyes: Negative for blurred vision and double vision.  Respiratory: Negative for cough and shortness of breath.   Cardiovascular: Negative for chest pain, palpitations and leg swelling.  Gastrointestinal: Negative for abdominal pain, diarrhea, nausea and vomiting.  Genitourinary: Negative for dysuria.  Musculoskeletal: Negative for joint pain and neck pain.  Neurological: Negative for dizziness, weakness and headaches.  Psychiatric/Behavioral: Negative for depression, hallucinations and memory loss.    Vitals:   Vitals:   08/02/16 0941  BP: 124/84  Pulse: 100  Temp: 97.9 F (36.6 C)  TempSrc: Oral  SpO2: 96%  Weight: 236 lb (107 kg)  Height: 5\' 9"  (1.753 m)     Body mass index is 34.85 kg/m.   Physical Exam:    Physical Exam  Constitutional: She appears well-developed and well-nourished. No distress.  Eyes: Pupils are equal, round, and reactive to light.  Neck: Neck supple. No JVD present. No thyromegaly present.  Cardiovascular: Normal rate, regular rhythm, normal heart sounds and intact distal pulses.   Pulmonary/Chest: Effort normal and breath sounds normal.  Abdominal: Soft.  Neurological: She is alert.  Skin: Skin is warm and dry.  Psychiatric: She has a normal mood and affect. Her behavior is normal. Judgment and thought content normal.     Assessment and Plan:    Cassidy Hopkins was seen today for follow-up and weight check.  Diagnoses and all orders for this visit:  Obesity (BMI 30-39.9) Comments: Doing well. Lost five pounds this month. Mnimal exercise. Orders: -     Referral to Nutrition and Diabetes Services -     phentermine (ADIPEX-P) 37.5 MG tablet; Take 1 tablet (37.5 mg total) by mouth daily before breakfast.  Generalized anxiety disorder Comments: Hallucinations with Zoloft.  Orders: -     busPIRone (BUSPAR) 7.5 MG tablet; Take 1 tablet (7.5 mg total) by mouth  2 (two) times daily.   . Reviewed expectations re: course of current medical issues. . Discussed self-management of symptoms. . Outlined signs and symptoms indicating need for more acute intervention. . Patient verbalized understanding and all questions were answered. . See orders for this visit as documented in the electronic medical record. . Patient received an After Visit Summary.  [name], CMA, acting as Education administrator.  CMA served as Education administrator during this visit. History, Physical, and Plan performed by medical provider. Documentation and orders reviewed and attested to. Briscoe Deutscher, D.O.   Briscoe Deutscher, [place], [place] 08/02/2016  Follow-up: 2 months

## 2016-08-02 NOTE — Progress Notes (Signed)
Pre visit review using our clinic review tool, if applicable. No additional management support is needed unless otherwise documented below in the visit note. 

## 2016-08-03 ENCOUNTER — Ambulatory Visit: Payer: Federal, State, Local not specified - PPO | Admitting: Family Medicine

## 2016-08-04 ENCOUNTER — Encounter: Payer: Self-pay | Admitting: Family Medicine

## 2016-08-07 ENCOUNTER — Telehealth: Payer: Self-pay

## 2016-08-07 NOTE — Telephone Encounter (Signed)
Spoke with patient and outlined all suggestions for anxiety management.  Also sent her a MyChart message with all details.  Patient would like to go the "no medicine route" at this time.  States she will research the suggested supplements as well.    She is interested in going to therapy and would like to know if Dr. Juleen China has any recommendations for a therapist?.  She would prefer a female.

## 2016-08-08 NOTE — Telephone Encounter (Signed)
Sent message to patient via My Chart recommending she call the office and schedule an appointment with Trey Paula.

## 2016-08-29 ENCOUNTER — Ambulatory Visit: Payer: Federal, State, Local not specified - PPO | Admitting: Dietician

## 2016-09-03 DIAGNOSIS — J988 Other specified respiratory disorders: Secondary | ICD-10-CM | POA: Diagnosis not present

## 2016-09-03 DIAGNOSIS — H9202 Otalgia, left ear: Secondary | ICD-10-CM | POA: Diagnosis not present

## 2016-10-01 ENCOUNTER — Other Ambulatory Visit: Payer: Self-pay | Admitting: Family Medicine

## 2016-10-01 DIAGNOSIS — E669 Obesity, unspecified: Secondary | ICD-10-CM

## 2016-10-02 ENCOUNTER — Encounter: Payer: Self-pay | Admitting: Surgical

## 2016-10-02 MED ORDER — PHENTERMINE HCL 37.5 MG PO TABS: 37.5000 mg | ORAL_TABLET | Freq: Every day | ORAL | 0 refills | Status: DC

## 2016-10-03 ENCOUNTER — Telehealth: Payer: Self-pay | Admitting: Family Medicine

## 2016-10-03 ENCOUNTER — Ambulatory Visit: Payer: Federal, State, Local not specified - PPO | Admitting: Dietician

## 2016-10-03 MED ORDER — HYDROCHLOROTHIAZIDE 25 MG PO TABS: ORAL_TABLET | ORAL | 0 refills | Status: DC

## 2016-10-03 NOTE — Telephone Encounter (Signed)
RX sent to pharmacy  

## 2016-10-03 NOTE — Telephone Encounter (Signed)
Patient needs a refill on the rx hydrochlorothiazide (HYDRODIURIL) 25 MG tablet.  [id number] [place], [place] (Phone) [phone] (Fax)   Please call and advise patient once this is placed.

## 2016-10-03 NOTE — Addendum Note (Signed)
Addended by: Durwin Glaze on: 10/03/2016 12:59 PM   Modules accepted: Orders

## 2016-10-10 ENCOUNTER — Ambulatory Visit: Payer: Federal, State, Local not specified - PPO | Admitting: Family Medicine

## 2016-10-13 ENCOUNTER — Telehealth: Payer: Self-pay | Admitting: Family Medicine

## 2016-10-13 MED ORDER — ACETAZOLAMIDE 250 MG PO TABS: 250.0000 mg | ORAL_TABLET | Freq: Once | ORAL | 2 refills | Status: DC

## 2016-10-13 NOTE — Telephone Encounter (Signed)
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 Is it ok to refill this?

## 2016-10-13 NOTE — Telephone Encounter (Signed)
RX sent to pharmacy  

## 2016-10-13 NOTE — Telephone Encounter (Signed)
Okay to fill? 

## 2016-10-13 NOTE — Telephone Encounter (Signed)
Patient requests refill on acetaZOLAMIDE (DIAMOX) 250 MG tablet  [id number] [address], [place] [phone] (Phone) [phone] (Fax)   Please call patient once rx has been placed.

## 2016-10-18 ENCOUNTER — Ambulatory Visit: Payer: Federal, State, Local not specified - PPO | Admitting: Family Medicine

## 2016-10-24 ENCOUNTER — Encounter: Payer: Self-pay | Admitting: Family Medicine

## 2016-10-24 ENCOUNTER — Ambulatory Visit (INDEPENDENT_AMBULATORY_CARE_PROVIDER_SITE_OTHER): Payer: Federal, State, Local not specified - PPO | Admitting: Family Medicine

## 2016-10-24 VITALS — BP 136/84 | HR 74 | Temp 98.1°F | Ht 69.0 in | Wt 233.2 lb

## 2016-10-24 DIAGNOSIS — F411 Generalized anxiety disorder: Secondary | ICD-10-CM

## 2016-10-24 DIAGNOSIS — M722 Plantar fascial fibromatosis: Secondary | ICD-10-CM

## 2016-10-24 DIAGNOSIS — M5432 Sciatica, left side: Secondary | ICD-10-CM

## 2016-10-24 DIAGNOSIS — E669 Obesity, unspecified: Secondary | ICD-10-CM

## 2016-10-24 MED ORDER — PHENTERMINE HCL 37.5 MG PO TABS: 37.5000 mg | ORAL_TABLET | Freq: Every day | ORAL | 0 refills | Status: DC

## 2016-10-24 MED ORDER — LORAZEPAM 0.5 MG PO TABS: 0.5000 mg | ORAL_TABLET | Freq: Two times a day (BID) | ORAL | 0 refills | Status: DC | PRN

## 2016-10-24 NOTE — Progress Notes (Signed)
Cassidy Hopkins is a 41 y.o. female is here for follow up.  History of Present Illness:   Cassidy Hopkins CMA acting as scribe for Dr. Juleen China.  HPI:  1. Generalized anxiety disorder.  She has the following anxiety symptoms: racing thoughts. Onset of symptoms was approximately several weeks ago.  Symptoms have been waxing and waning since that time. She denies current suicidal and homicidal ideation. Family history significant for anxiety.Risk factors: negative life event - mother recently diagnosed with dementia. Previous treatment includes medication BuSpar and Zoloft.   She complains of the following medication side effects: dizziness and hallucinations.   2. Obesity (BMI 30-39.9). Using phentermine without side-effects. Down three pounds.    3. Left sided sciatica. Symptoms have been present for a few months. No trauma. Pain in left low back with radiation down left leg. Mild intermittent numbness.    4. Plantar fasciitis of left foot. Left heel. A few months. Tried ice, NSAIDs. Pain is worst in the morning. No trauma.   There are no preventive care reminders to display for this patient.  PMHx, SurgHx, SocialHx, FamHx, Medications, and Allergies were reviewed in the Visit Navigator and updated as appropriate.   Patient Active Problem List   Diagnosis Date Noted  . Multiple lipomas 07/07/2016  . Hx of tubal ligation 07/06/2016  . Generalized anxiety disorder 03/31/2016  . Hypothyroidism 01/17/2016  . Pseudotumor cerebri 01/17/2016  . Obesity (BMI 30-39.9) 01/17/2016   Social History  Substance Use Topics  . Smoking status: Never Smoker  . Smokeless tobacco: Never Used  . Alcohol use No   Current Medications and Allergies:   .  cetirizine (ZYRTEC) 10 MG tablet, Take by mouth., Disp: , Rfl:  .  hydrochlorothiazide (HYDRODIURIL) 25 MG tablet, TAKE ONE TABLET (25 MG TOTAL) BY MOUTH DAILY., Disp: 30 tablet, Rfl: 0 .  levothyroxine (SYNTHROID, LEVOTHROID) 112 MCG tablet, TAKE 1  TABLET (112 MCG TOTAL) BY MOUTH DAILY., Disp: 90 tablet, Rfl: 3 .  phentermine (ADIPEX-P) 37.5 MG tablet, Take 1 tablet (37.5 mg total) by mouth daily before breakfast., Disp: 30 tablet, Rfl: 0 .  acetaZOLAMIDE (DIAMOX) 250 MG tablet, Take 1 tablet (250 mg total) by mouth once., Disp: 30 tablet, Rfl: 2  Allergies  Allergen Reactions  . Penicillins Rash and Shortness Of Breath  . Topiramate Anxiety   Review of Systems   Review of Systems  Constitutional: Negative for chills, fever and malaise/fatigue.  HENT: Negative for ear pain, sinus pain and sore throat.   Eyes: Negative for blurred vision and double vision.  Respiratory: Negative for cough, shortness of breath and wheezing.   Cardiovascular: Negative for chest pain, palpitations and leg swelling.  Gastrointestinal: Negative for abdominal pain, nausea and vomiting.  Musculoskeletal: Positive for back pain. Negative for joint pain and neck pain.       Lower back pain.   Neurological: Negative for dizziness and headaches.  Psychiatric/Behavioral: Negative for depression, hallucinations and memory loss.   Vitals:   Vitals:   10/24/16 1034  BP: 136/84  Pulse: 74  Temp: 98.1 F (36.7 C)  TempSrc: Oral  SpO2: 97%  Weight: 233 lb 3.2 oz (105.8 kg)  Height: 5\' 9"  (1.753 m)     Body mass index is 34.44 kg/m.   Physical Exam:   Physical Exam  Constitutional: She appears well-developed and well-nourished. No distress.  HENT:  Head: Normocephalic and atraumatic.  Eyes: EOM are normal. Pupils are equal, round, and reactive to light.  Neck: Normal  range of motion. Neck supple.  Cardiovascular: Normal rate, regular rhythm, normal heart sounds and intact distal pulses.   Pulmonary/Chest: Effort normal.  Abdominal: Soft.  Musculoskeletal:       Back:  TTP left plantar fascia.  Skin: Skin is warm.  Psychiatric: She has a normal mood and affect. Her behavior is normal.  Nursing note and vitals reviewed.   Assessment and  Plan:   Cassidy Hopkins was seen today for follow-up.  Diagnoses and all orders for this visit:  Generalized anxiety disorder Comments: Okay low dose Ativan to be used sparingly. I expect this Rx to last at least 6 months. Orders: -     LORazepam (ATIVAN) 0.5 MG tablet; Take 1 tablet (0.5 mg total) by mouth 2 (two) times daily as needed for anxiety.  Obesity (BMI 30-39.9) Comments: Reviewed weight loss options at length. She feels most comfortable with phentermine plus weight watchers. Offered support.  Orders: -     phentermine (ADIPEX-P) 37.5 MG tablet; Take 1 tablet (37.5 mg total) by mouth daily before breakfast. -     phentermine (ADIPEX-P) 37.5 MG tablet; Take 1 tablet (37.5 mg total) by mouth daily before breakfast. -     phentermine (ADIPEX-P) 37.5 MG tablet; Take 1 tablet (37.5 mg total) by mouth daily before breakfast.  Left sided sciatica Comments: Reviewed NSAIDs, stretches. To Cassidy Hopkins if not improving.   Plantar fasciitis of left foot Comments: Reviewed NSAIDs, stretches. Discussed proper shoe-wear. To Cassidy Hopkins if not improving.    . Reviewed expectations re: course of current medical issues. . Discussed self-management of symptoms. . Outlined signs and symptoms indicating need for more acute intervention. . Patient verbalized understanding and all questions were answered. Marland Kitchen Health Maintenance issues including appropriate healthy diet, exercise, and smoking avoidance were discussed with patient. . See orders for this visit as documented in the electronic medical record. . Patient received an After Visit Summary.  CMA served as Education administrator during this visit. History, Physical, and Plan performed by medical provider. The above documentation has been reviewed and is accurate and complete. Briscoe Deutscher, D.O.  Briscoe Deutscher, DO [place], [place] 10/24/2016  Future Appointments Date Time Provider [place]  01/23/2017 11:30 AM Briscoe Deutscher, DO [facility] None

## 2016-10-30 ENCOUNTER — Other Ambulatory Visit: Payer: Self-pay | Admitting: Internal Medicine

## 2016-10-30 ENCOUNTER — Other Ambulatory Visit: Payer: Self-pay | Admitting: Family Medicine

## 2016-10-31 MED ORDER — HYDROCHLOROTHIAZIDE 25 MG PO TABS: ORAL_TABLET | ORAL | 0 refills | Status: DC

## 2016-11-20 ENCOUNTER — Encounter: Payer: Self-pay | Admitting: Family Medicine

## 2016-11-29 ENCOUNTER — Other Ambulatory Visit: Payer: Self-pay | Admitting: Family Medicine

## 2016-11-29 ENCOUNTER — Encounter: Payer: Self-pay | Admitting: Family Medicine

## 2016-12-15 ENCOUNTER — Other Ambulatory Visit: Payer: Self-pay | Admitting: Family Medicine

## 2016-12-18 ENCOUNTER — Other Ambulatory Visit: Payer: Self-pay | Admitting: Family Medicine

## 2017-01-02 ENCOUNTER — Ambulatory Visit: Payer: Federal, State, Local not specified - PPO | Admitting: Sports Medicine

## 2017-01-02 ENCOUNTER — Other Ambulatory Visit: Payer: Self-pay | Admitting: Family Medicine

## 2017-01-02 DIAGNOSIS — F411 Generalized anxiety disorder: Secondary | ICD-10-CM

## 2017-01-02 NOTE — Telephone Encounter (Signed)
Please advise on refill.

## 2017-01-03 NOTE — Telephone Encounter (Signed)
Refill called in to patient's pharmacy. 

## 2017-01-12 NOTE — Telephone Encounter (Signed)
error 

## 2017-01-17 ENCOUNTER — Other Ambulatory Visit: Payer: Self-pay | Admitting: Family Medicine

## 2017-01-17 DIAGNOSIS — E669 Obesity, unspecified: Secondary | ICD-10-CM

## 2017-01-18 ENCOUNTER — Other Ambulatory Visit: Payer: Self-pay

## 2017-01-18 ENCOUNTER — Telehealth: Payer: Self-pay | Admitting: Family Medicine

## 2017-01-18 DIAGNOSIS — E669 Obesity, unspecified: Secondary | ICD-10-CM

## 2017-01-18 MED ORDER — PHENTERMINE HCL 37.5 MG PO TABS: 37.5000 mg | ORAL_TABLET | Freq: Every day | ORAL | 0 refills | Status: DC

## 2017-01-18 NOTE — Telephone Encounter (Signed)
Please advise.  Patient has appointment scheduled on 01/23/17.  States she has 1 tablet left.

## 2017-01-18 NOTE — Telephone Encounter (Signed)
Refill faxed to patient's pharmacy.

## 2017-01-18 NOTE — Telephone Encounter (Signed)
Okay refill. 

## 2017-01-18 NOTE — Telephone Encounter (Signed)
MEDICATION: phentermine (ADIPEX-P) 37.5 MG tablet  PHARMACY:    [id number] [address], [place] - [place] [phone] (Phone) [phone] (Fax)     IS THIS A 90 DAY SUPPLY : N   IS PATIENT OUT OF MEDICATION: N  IF NOT; HOW MUCH IS LEFT: 1  LAST APPOINTMENT DATE: 10/24/16  NEXT APPOINTMENT DATE: 01/23/17  OTHER COMMENTS:    **Let patient know to contact pharmacy at the end of the day to make sure medication is ready. **  ** Please notify patient to allow 48-72 hours to process**  **Encourage patient to contact the pharmacy for refills or they can request refills through [facility]**

## 2017-01-23 ENCOUNTER — Encounter: Payer: Self-pay | Admitting: Family Medicine

## 2017-01-23 ENCOUNTER — Ambulatory Visit (INDEPENDENT_AMBULATORY_CARE_PROVIDER_SITE_OTHER): Payer: Federal, State, Local not specified - PPO | Admitting: Family Medicine

## 2017-01-23 VITALS — BP 120/90 | HR 104 | Ht 69.0 in | Wt 222.0 lb

## 2017-01-23 DIAGNOSIS — E669 Obesity, unspecified: Secondary | ICD-10-CM | POA: Diagnosis not present

## 2017-01-23 DIAGNOSIS — M25512 Pain in left shoulder: Secondary | ICD-10-CM | POA: Diagnosis not present

## 2017-01-23 DIAGNOSIS — F411 Generalized anxiety disorder: Secondary | ICD-10-CM | POA: Diagnosis not present

## 2017-01-23 DIAGNOSIS — F4312 Post-traumatic stress disorder, chronic: Secondary | ICD-10-CM | POA: Diagnosis not present

## 2017-01-23 MED ORDER — PHENTERMINE HCL 37.5 MG PO TABS: 37.5000 mg | ORAL_TABLET | Freq: Every day | ORAL | 2 refills | Status: DC

## 2017-01-23 MED ORDER — CLONAZEPAM 0.5 MG PO TABS: 0.5000 mg | ORAL_TABLET | Freq: Two times a day (BID) | ORAL | 1 refills | Status: DC | PRN

## 2017-01-23 MED ORDER — DICLOFENAC SODIUM 2 % TD SOLN: 1.0000 "application " | Freq: Two times a day (BID) | TRANSDERMAL | 0 refills | Status: AC

## 2017-01-23 NOTE — Progress Notes (Signed)
Cassidy Hopkins is a 41 y.o. female is here for follow up.  History of Present Illness:   Cassidy Hopkins, cma is acting as a Education administrator for [employer], DO.  HPI:  Generalized Anxiety Disorder This has been an ongoing for several years now. Her main stressers are due to her mothers illness with dementia and parenting 4 children. Takes Lorazepam 0.5mg  bid with relief. She starts counseling tonight with [facility] with a Holiday representative. She is very anxious about opening up but feels she is ready. Daily walks and church are a few things that help relieve her stress. She denies current suicidal and homicidal ideation.   Obesity Doing well. Weight watchers. Tolerating phentermine. Wants to continue.  Wt Readings from Last 3 Encounters:  01/23/17 222 lb (100.7 kg)  10/24/16 233 lb 3.2 oz (105.8 kg)  08/02/16 236 lb (107 kg)   Shoulder Pain Patient complains of left shoulder pain. The symptoms began several days ago. Aggravating factors: injury while lifting baby carrier. Pain is located diffusely throughout the shoulder. Discomfort is described as aching. Symptoms are exacerbated by repetitive movements, overhead movements and lying on the shoulder. Evaluation to date: none. Therapy to date includes: rest.  PMHx, SurgHx, SocialHx, FamHx, Medications, and Allergies were reviewed in the Visit Navigator and updated as appropriate.   Patient Active Problem List   Diagnosis Date Noted  . Multiple lipomas 07/07/2016  . Hx of tubal ligation 07/06/2016  . Generalized anxiety disorder 03/31/2016  . Hypothyroidism 01/17/2016  . Pseudotumor cerebri 01/17/2016  . Obesity (BMI 30-39.9) 01/17/2016   Social History  Substance Use Topics  . Smoking status: Never Smoker  . Smokeless tobacco: Never Used  . Alcohol use No   Current Medications and Allergies:   Current Outpatient Prescriptions:  .  acetaZOLAMIDE (DIAMOX) 250 MG tablet, TAKE 1 TABLET BY MOUTH EVERY DAY, Disp: 30 tablet,  Rfl: 2 .  cetirizine (ZYRTEC) 10 MG tablet, Take by mouth., Disp: , Rfl:  .  hydrochlorothiazide (HYDRODIURIL) 25 MG tablet, TAKE 1 TABLET BY MOUTH EVERY DAY, Disp: 30 tablet, Rfl: 1 .  levothyroxine (SYNTHROID, LEVOTHROID) 112 MCG tablet, TAKE 1 TABLET (112 MCG TOTAL) BY MOUTH DAILY., Disp: 90 tablet, Rfl: 3 .  LORazepam (ATIVAN) 0.5 MG tablet, TAKE 1 TABLET BY MOUTH TWICE A DAY AS NEEDED ANXIETY, Disp: 60 tablet, Rfl: 0 .  Multiple Vitamins-Minerals (HAIR SKIN AND NAILS FORMULA PO), Take by mouth., Disp: , Rfl:  .  phentermine (ADIPEX-P) 37.5 MG tablet, Take 1 tablet (37.5 mg total) by mouth daily before breakfast., Disp: 30 tablet, Rfl: 0  Allergies  Allergen Reactions  . Penicillins Rash and Shortness Of Breath  . Topiramate Anxiety   Review of Systems   Pertinent items are noted in the HPI. Otherwise, ROS is negative.  Vitals:   Vitals:   01/23/17 1152  BP: 120/90  Pulse: (!) 104  SpO2: 99%  Weight: 222 lb (100.7 kg)  Height: 5\' 9"  (1.753 m)     Body mass index is 32.78 kg/m.   Physical Exam:   Physical Exam  Constitutional: She appears well-developed and well-nourished. No distress.  HENT:  Head: Normocephalic and atraumatic.  Eyes: Pupils are equal, round, and reactive to light. EOM are normal.  Neck: Normal range of motion. Neck supple.  Cardiovascular: Normal rate, regular rhythm, normal heart sounds and intact distal pulses.   Pulmonary/Chest: Effort normal.  Abdominal: Soft.  Musculoskeletal:       Left shoulder: She exhibits decreased  range of motion and tenderness. She exhibits no effusion, no crepitus and normal pulse.  Skin: Skin is warm.  Psychiatric: She has a normal mood and affect. Her behavior is normal.  Nursing note and vitals reviewed.   Assessment and Plan:   Cassidy Hopkins was seen today for 3 month follow up.  Diagnoses and all orders for this visit:  Generalized anxiety disorder Comments: Stable. She is starting therapy tonight. I've asked her  to check in with me to let me know how it went. Continue current treatment.  Orders: -     clonazePAM (KLONOPIN) 0.5 MG tablet; Take 1 tablet (0.5 mg total) by mouth 2 (two) times daily as needed for anxiety.  Obesity (BMI 30-39.9) Comments: Continue making healthy food choices. Continue 1/2 dose Phentermine.  Orders: -     phentermine (ADIPEX-P) 37.5 MG tablet; Take 1 tablet (37.5 mg total) by mouth daily before breakfast.  Acute pain of left shoulder Comments: NEW.  Handout provided with stretches, exercises. Sample of Pennsaid provided. Patient to call if not improving.  Orders: -     Diclofenac Sodium (PENNSAID) 2 % SOLN; Place 1 application onto the skin 2 (two) times daily.   . Reviewed expectations re: course of current medical issues. . Discussed self-management of symptoms. . Outlined signs and symptoms indicating need for more acute intervention. . Patient verbalized understanding and all questions were answered. Marland Kitchen Health Maintenance issues including appropriate healthy diet, exercise, and smoking avoidance were discussed with patient. . See orders for this visit as documented in the electronic medical record. . Patient received an After Visit Summary.  CMA served as Education administrator during this visit. History, Physical, and Plan performed by medical provider. The above documentation has been reviewed and is accurate and complete. Briscoe Deutscher, D.O.  Briscoe Deutscher, DO [place] 01/27/2017  Future Appointments Date Time Provider [place]  04/24/2017 11:00 AM Briscoe Deutscher, DO [facility] None

## 2017-01-25 ENCOUNTER — Encounter: Payer: Self-pay | Admitting: Family Medicine

## 2017-01-25 DIAGNOSIS — M722 Plantar fascial fibromatosis: Secondary | ICD-10-CM | POA: Diagnosis not present

## 2017-01-25 DIAGNOSIS — M79672 Pain in left foot: Secondary | ICD-10-CM | POA: Diagnosis not present

## 2017-01-29 MED ORDER — CYCLOBENZAPRINE HCL 5 MG PO TABS: 5.0000 mg | ORAL_TABLET | Freq: Three times a day (TID) | ORAL | 0 refills | Status: DC | PRN

## 2017-01-30 DIAGNOSIS — F4312 Post-traumatic stress disorder, chronic: Secondary | ICD-10-CM | POA: Diagnosis not present

## 2017-02-06 DIAGNOSIS — F4312 Post-traumatic stress disorder, chronic: Secondary | ICD-10-CM | POA: Diagnosis not present

## 2017-02-13 DIAGNOSIS — F4312 Post-traumatic stress disorder, chronic: Secondary | ICD-10-CM | POA: Diagnosis not present

## 2017-02-27 ENCOUNTER — Other Ambulatory Visit: Payer: Self-pay | Admitting: Family Medicine

## 2017-02-27 DIAGNOSIS — F4312 Post-traumatic stress disorder, chronic: Secondary | ICD-10-CM | POA: Diagnosis not present

## 2017-03-06 DIAGNOSIS — F4312 Post-traumatic stress disorder, chronic: Secondary | ICD-10-CM | POA: Diagnosis not present

## 2017-03-13 DIAGNOSIS — F4312 Post-traumatic stress disorder, chronic: Secondary | ICD-10-CM | POA: Diagnosis not present

## 2017-03-15 DIAGNOSIS — K080 Exfoliation of teeth due to systemic causes: Secondary | ICD-10-CM | POA: Diagnosis not present

## 2017-03-20 ENCOUNTER — Other Ambulatory Visit: Payer: Self-pay | Admitting: Family Medicine

## 2017-03-20 DIAGNOSIS — F411 Generalized anxiety disorder: Secondary | ICD-10-CM

## 2017-03-21 NOTE — Telephone Encounter (Signed)
Okay 

## 2017-03-21 NOTE — Telephone Encounter (Signed)
Please advise on refill.

## 2017-03-27 DIAGNOSIS — F4312 Post-traumatic stress disorder, chronic: Secondary | ICD-10-CM | POA: Diagnosis not present

## 2017-04-03 DIAGNOSIS — F4312 Post-traumatic stress disorder, chronic: Secondary | ICD-10-CM | POA: Diagnosis not present

## 2017-04-06 ENCOUNTER — Other Ambulatory Visit: Payer: Self-pay | Admitting: Family Medicine

## 2017-04-16 ENCOUNTER — Encounter: Payer: Self-pay | Admitting: Family Medicine

## 2017-04-17 DIAGNOSIS — F4312 Post-traumatic stress disorder, chronic: Secondary | ICD-10-CM | POA: Diagnosis not present

## 2017-04-17 DIAGNOSIS — M722 Plantar fascial fibromatosis: Secondary | ICD-10-CM | POA: Diagnosis not present

## 2017-04-20 ENCOUNTER — Encounter: Payer: Self-pay | Admitting: Family Medicine

## 2017-04-20 ENCOUNTER — Telehealth: Payer: Self-pay | Admitting: Family Medicine

## 2017-04-20 ENCOUNTER — Telehealth: Payer: Self-pay

## 2017-04-20 ENCOUNTER — Ambulatory Visit: Payer: Federal, State, Local not specified - PPO | Admitting: Family Medicine

## 2017-04-20 VITALS — BP 124/82 | HR 92 | Temp 98.2°F | Ht 69.0 in | Wt 232.0 lb

## 2017-04-20 DIAGNOSIS — F411 Generalized anxiety disorder: Secondary | ICD-10-CM | POA: Diagnosis not present

## 2017-04-20 DIAGNOSIS — E669 Obesity, unspecified: Secondary | ICD-10-CM | POA: Diagnosis not present

## 2017-04-20 DIAGNOSIS — F909 Attention-deficit hyperactivity disorder, unspecified type: Secondary | ICD-10-CM | POA: Diagnosis not present

## 2017-04-20 MED ORDER — AMPHETAMINE-DEXTROAMPHETAMINE 10 MG PO TABS: 10.0000 mg | ORAL_TABLET | Freq: Two times a day (BID) | ORAL | 0 refills | Status: DC

## 2017-04-20 MED ORDER — LISDEXAMFETAMINE DIMESYLATE 20 MG PO CAPS: 20.0000 mg | ORAL_CAPSULE | Freq: Every day | ORAL | 0 refills | Status: DC

## 2017-04-20 NOTE — Telephone Encounter (Signed)
Copied from [place]. Topic: Inquiry >> Apr 20, 2017  5:44 PM Oliver Pila B wrote: Reason for CRM: pt called to inform that the pharmacy hasnt received the Rx yet from Dr. Juleen China, please contact pt or pharmacy to advise

## 2017-04-20 NOTE — Telephone Encounter (Signed)
Copied from [place]. Topic: Inquiry >> Apr 20, 2017  5:44 PM Oliver Pila B wrote: Reason for CRM: pt called to inform that the pharmacy hasnt received the Rx yet from Dr. Juleen China, please contact pt or pharmacy to advise

## 2017-04-20 NOTE — Telephone Encounter (Signed)
Called [facility] let [facility]

## 2017-04-20 NOTE — Telephone Encounter (Signed)
Copied from [place] [phone]. Topic: General - Other >> Apr 20, 2017  3:15 PM Marin Olp L wrote: Reason for CRM: Patient can't afford lisdexamfetamine (VYVANSE) 20 MG capsule b/c it's $247. Please advise.

## 2017-04-20 NOTE — Progress Notes (Addendum)
Cassidy Hopkins is a 41 y.o. female is here for follow up.  History of Present Illness:   HPI:   1. Adult ADHD. Concern for ADHD. No prior evaluation. See below for testing. Concern for mild BED as well.   2. Obesity (BMI 30-39.9). Stopped phentermine. Not helping at night with overeating. Has really noticed lack of focus. See above.   Wt Readings from Last 3 Encounters:  04/20/17 232 lb (105.2 kg)  01/23/17 222 lb (100.7 kg)  10/24/16 233 lb 3.2 oz (105.8 kg)    3. Generalized anxiety disorder. Working with therapist. Going well. Continuing Klonopin.    Within the past 3 months... Rarely Sometimes Often Always  During the past 3 months, have you had any episodes of excessive overeating?  x    Do you feel distressed about your excessive overeating?  x    During your episodes of excessive overeating, how often did you feel like you had no control over your eating? x     During your episodes of excessive eating, how often did you continue to eat even though you were not hungry?  x    During your episodes of excessive overeating, how often were you embarrassed by how much you ate? x     During your episodes of excessive overeating, how often did you feel disgusted by yourself or guilty afterwards? x     During the last 3 months, how often did you make yourself vomit as a means to control your weight or shape? None        Adult ADHD Self Report Scale (most recent)    Adult ADHD Self-Report Scale (ASRS-v1.1) Symptom Checklist - 04/20/17 1037      Part A   1. How often do you have trouble wrapping up the final details of a project, once the challenging parts have been done?  Sometimes  (Abnormal)   2. How often do you have difficulty getting things done in order when you have to do a task that requires organization?  Often  (Abnormal)     3. How often do you have problems remembering appointments or obligations?  Often  (Abnormal)   4. When you have a task that requires a lot of thought,  how often do you avoid or delay getting started?  Often  (Abnormal)     5. How often do you fidget or squirm with your hands or feet when you have to sit down for a long time?  Very Often  (Abnormal)   6. How often do you feel overly active and compelled to do things, like you were driven by a motor?  Often  (Abnormal)       Part B   7. How often do you make careless mistakes when you have to work on a boring or difficult project?  Sometimes  8. How often do you have difficulty keeping your attention when you are doing boring or repetitive work?  Very Often  (Abnormal)     9. How often do you have difficulty concentrating on what people say to you, even when they are speaking to you directly?  Often  (Abnormal)   10. How often do you misplace or have difficulty finding things at home or at work?  Often  (Abnormal)     11. How often are you distracted by activity or noise around you?  Often  (Abnormal)   12. How often do you leave your seat in meetings or other situations  in which you are expected to remain seated?  Sometimes  (Abnormal)     13. How often do you feel restless or fidgety?  Very Often  (Abnormal)   14. How often do you have difficulty unwinding and relaxing when you have time to yourself?  Often  (Abnormal)     15. How often do you find yourself talking too much when you are in social situations?  Often  (Abnormal)   16. When you are in a conversation, how often do you find yourself finishing the sentences of the people you are talking to, before they can finish them themselves?  Often  (Abnormal)     17. How often do you have difficulty waiting your turn in situations when turn taking is required?  Often  (Abnormal)   18. How often do you interrupt others when they are busy?  Never       Depression screen PHQ 2/9 01/23/2017  Decreased Interest 1  Down, Depressed, Hopeless 2  PHQ - 2 Score 3  Altered sleeping 0  Tired, decreased energy 1  Change in appetite 1  Feeling bad or failure  about yourself  3  Trouble concentrating 3  Moving slowly or fidgety/restless 1  Suicidal thoughts 0  PHQ-9 Score 12  Difficult doing work/chores Somewhat difficult   PMHx, SurgHx, SocialHx, FamHx, Medications, and Allergies were reviewed in the Visit Navigator and updated as appropriate.   Patient Active Problem List   Diagnosis Date Noted  . Multiple lipomas 07/07/2016  . Hx of tubal ligation 07/06/2016  . Generalized anxiety disorder 03/31/2016  . Hypothyroidism 01/17/2016  . Pseudotumor cerebri 01/17/2016  . Obesity (BMI 30-39.9) 01/17/2016   Social History   Tobacco Use  . Smoking status: Never Smoker  . Smokeless tobacco: Never Used  Substance Use Topics  . Alcohol use: No  . Drug use: No   Current Medications and Allergies:   .  acetaZOLAMIDE (DIAMOX) 250 MG tablet, TAKE 1 TABLET BY MOUTH EVERY DAY, Disp: 30 tablet, Rfl: 2 .  cetirizine (ZYRTEC) 10 MG tablet, Take by mouth., Disp: , Rfl:  .  clonazePAM (KLONOPIN) 0.5 MG tablet, TAKE 1 TABLET BY MOUTH TWICE A DAY AS NEEDED ANXIETY, Disp: 60 tablet, Rfl: 1 .  hydrochlorothiazide (HYDRODIURIL) 25 MG tablet, TAKE 1 TABLET BY MOUTH EVERY DAY, Disp: 30 tablet, Rfl: 1 .  levothyroxine (SYNTHROID, LEVOTHROID) 112 MCG tablet, TAKE 1 TABLET (112 MCG TOTAL) BY MOUTH DAILY., Disp: 90 tablet, Rfl: 3 .  Multiple Vitamins-Minerals (HAIR SKIN AND NAILS FORMULA PO), Take by mouth., Disp: , Rfl:   Allergies  Allergen Reactions  . Penicillins Rash and Shortness Of Breath  . Topiramate Anxiety   Review of Systems   Pertinent items are noted in the HPI. Otherwise, ROS is negative.  Vitals:   Vitals:   04/20/17 0947  BP: 124/82  Pulse: 92  Temp: 98.2 F (36.8 C)  TempSrc: Oral  SpO2: 97%  Weight: 232 lb (105.2 kg)  Height: 5\' 9"  (1.753 m)     Body mass index is 34.26 kg/m.   Physical Exam:   Physical Exam  Constitutional: She is oriented to person, place, and time. She appears well-developed and well-nourished. No  distress.  HENT:  Head: Normocephalic and atraumatic.  Right Ear: External ear normal.  Left Ear: External ear normal.  Nose: Nose normal.  Mouth/Throat: Oropharynx is clear and moist.  Eyes: Conjunctivae and EOM are normal. Pupils are equal, round, and reactive to light.  Neck: Normal range of motion. Neck supple. No thyromegaly present.  Cardiovascular: Normal rate, regular rhythm, normal heart sounds and intact distal pulses.  Pulmonary/Chest: Effort normal and breath sounds normal.  Abdominal: Soft. Bowel sounds are normal.  Musculoskeletal: Normal range of motion.  Lymphadenopathy:    She has no cervical adenopathy.  Neurological: She is alert and oriented to person, place, and time.  Skin: Skin is warm and dry. Capillary refill takes less than 2 seconds.  Psychiatric: She has a normal mood and affect. Her behavior is normal.  Nursing note and vitals reviewed.   Assessment and Plan:   Rosalinda was seen today for follow-up.  Diagnoses and all orders for this visit:  Adult ADHD Comments: NEW. The benefits of stimulant medication treatment appear to outweigh the current risks. We discussed risks and benefits of different medications. I recommended and discussed appropriate dietary modifications and routine exercise.  Stimulant medication management, careful titration, and dose optimization of stimulant medication was discussed as the treatment option with the best scientific evidence helping reduce ADHD symptoms. There is no cure for ADHD.  Stimulant medication side effects: The temporary side effects of stimulants including decreased appetite, sleep disturbance, mood changes, personality changes, ticks, increases in heart rate and blood pressure, abdominal pain, nausea, vomiting, and dry mouth were discussed.  Orders: -     amphetamine-dextroamphetamine (ADDERALL) 10 MG tablet; Take 1 tablet (10 mg total) by mouth 2 (two) times daily.  Obesity (BMI 30-39.9) Comments: The patient  is asked to make an attempt to improve diet and exercise patterns to aid in medical management of this problem.   Generalized anxiety disorder Comments: Continue current treatment.   . Reviewed expectations re: course of current medical issues. . Discussed self-management of symptoms. . Outlined signs and symptoms indicating need for more acute intervention. . Patient verbalized understanding and all questions were answered. Marland Kitchen Health Maintenance issues including appropriate healthy diet, exercise, and smoking avoidance were discussed with patient. . See orders for this visit as documented in the electronic medical record. . Patient received an After Visit Summary.  Briscoe Deutscher, DO [place] 04/21/2017  Future Appointments  Date Time Provider [place]  05/24/2017 10:00 AM Briscoe Deutscher, DO [facility]

## 2017-04-20 NOTE — Telephone Encounter (Signed)
Will change to Adderall 10 mg po BID. Rx printed to be faxed.

## 2017-04-22 ENCOUNTER — Other Ambulatory Visit: Payer: Self-pay | Admitting: Family Medicine

## 2017-04-22 ENCOUNTER — Encounter: Payer: Self-pay | Admitting: Family Medicine

## 2017-04-23 ENCOUNTER — Telehealth: Payer: Self-pay

## 2017-04-23 NOTE — Telephone Encounter (Signed)
Informed patient Adderall prescription is at pharmacy this morning.

## 2017-04-23 NOTE — Telephone Encounter (Signed)
PA for patient's Adderall was completed and approved and approval was faxed to patient's pharmacy.

## 2017-04-24 ENCOUNTER — Ambulatory Visit: Payer: Federal, State, Local not specified - PPO | Admitting: Family Medicine

## 2017-04-24 DIAGNOSIS — F4312 Post-traumatic stress disorder, chronic: Secondary | ICD-10-CM | POA: Diagnosis not present

## 2017-05-08 DIAGNOSIS — F4312 Post-traumatic stress disorder, chronic: Secondary | ICD-10-CM | POA: Diagnosis not present

## 2017-05-13 ENCOUNTER — Other Ambulatory Visit: Payer: Self-pay | Admitting: Family Medicine

## 2017-05-14 NOTE — Telephone Encounter (Signed)
Now a patient of Dr. Juleen China. Needs Rx refilled.  Thanks

## 2017-05-16 ENCOUNTER — Other Ambulatory Visit: Payer: Self-pay | Admitting: Family Medicine

## 2017-05-16 DIAGNOSIS — F909 Attention-deficit hyperactivity disorder, unspecified type: Secondary | ICD-10-CM

## 2017-05-16 MED ORDER — AMPHETAMINE-DEXTROAMPHETAMINE 10 MG PO TABS: 10.0000 mg | ORAL_TABLET | Freq: Two times a day (BID) | ORAL | 0 refills | Status: DC

## 2017-05-16 NOTE — Telephone Encounter (Signed)
MEDICATION: Adderall 10 mg    PH[facility]ACY:  [facility] [address]   IS THIS A 90 DAY SUPPLY : no   IS PATIENT OUT OF MEDICATION:   IF NOT; HOW MUCH IS LEFT:   LAST APPOINTMENT DATE: @12 /07/2016  NEXT APPOINTMENT DATE:@1 /07/2017  OTHER COMMENTS:    **Let patient know to contact pharmacy at the end of the day to make sure medication is ready. **  ** Please notify patient to allow 48-72 hours to process**  **Encourage patient to contact the pharmacy for refills or they can request refills through [facility]**

## 2017-05-17 ENCOUNTER — Other Ambulatory Visit: Payer: Self-pay | Admitting: Family Medicine

## 2017-05-17 DIAGNOSIS — F411 Generalized anxiety disorder: Secondary | ICD-10-CM

## 2017-05-17 NOTE — Telephone Encounter (Signed)
Please advise on refill.

## 2017-05-18 NOTE — Telephone Encounter (Signed)
Adv pt we did recv'd her med refill and it is pending for with the provider.  And med refill may take 3 business days.

## 2017-05-24 ENCOUNTER — Ambulatory Visit: Payer: Federal, State, Local not specified - PPO | Admitting: Family Medicine

## 2017-05-24 ENCOUNTER — Encounter: Payer: Self-pay | Admitting: Family Medicine

## 2017-05-24 VITALS — BP 130/82 | HR 93 | Temp 98.1°F | Ht 69.0 in | Wt 227.2 lb

## 2017-05-24 DIAGNOSIS — F411 Generalized anxiety disorder: Secondary | ICD-10-CM | POA: Diagnosis not present

## 2017-05-24 DIAGNOSIS — F909 Attention-deficit hyperactivity disorder, unspecified type: Secondary | ICD-10-CM | POA: Diagnosis not present

## 2017-05-24 DIAGNOSIS — E669 Obesity, unspecified: Secondary | ICD-10-CM

## 2017-05-24 NOTE — Progress Notes (Signed)
Cassidy Hopkins is a 42 y.o. female is here for follow up.  History of Present Illness:   HPI: See Assessment and Plan section for Problem Based Charting of issues discussed today.  There are no preventive care reminders to display for this patient.   Depression screen PHQ 2/9 01/23/2017  Decreased Interest 1  Down, Depressed, Hopeless 2  PHQ - 2 Score 3  Altered sleeping 0  Tired, decreased energy 1  Change in appetite 1  Feeling bad or failure about yourself  3  Trouble concentrating 3  Moving slowly or fidgety/restless 1  Suicidal thoughts 0  PHQ-9 Score 12  Difficult doing work/chores Somewhat difficult   PMHx, SurgHx, SocialHx, FamHx, Medications, and Allergies were reviewed in the Visit Navigator and updated as appropriate.   Patient Active Problem List   Diagnosis Date Noted  . Multiple lipomas 07/07/2016  . Hx of tubal ligation 07/06/2016  . Generalized anxiety disorder 03/31/2016  . Hypothyroidism 01/17/2016  . Pseudotumor cerebri 01/17/2016  . Obesity (BMI 30-39.9) 01/17/2016   Social History   Tobacco Use  . Smoking status: Never Smoker  . Smokeless tobacco: Never Used  Substance Use Topics  . Alcohol use: No  . Drug use: No   Current Medications and Allergies:   .  acetaZOLAMIDE (DIAMOX) 250 MG tablet, TAKE 1 TABLET BY MOUTH EVERY DAY, Disp: 30 tablet, Rfl: 2 .  amphetamine-dextroamphetamine (ADDERALL) 10 MG tablet, Take 1 tablet (10 mg total) by mouth 2 (two) times daily., Disp: 60 tablet, Rfl: 0 .  cetirizine (ZYRTEC) 10 MG tablet, Take by mouth., Disp: , Rfl:  .  clonazePAM (KLONOPIN) 0.5 MG tablet, TAKE 1 TABLET BY MOUTH TWICE A DAY AS NEEDED FOR ANXIETY, Disp: 60 tablet, Rfl: 1 .  hydrochlorothiazide (HYDRODIURIL) 25 MG tablet, TAKE 1 TABLET BY MOUTH EVERY DAY, Disp: 30 tablet, Rfl: 1 .  levothyroxine (SYNTHROID, LEVOTHROID) 112 MCG tablet, TAKE 1 TABLET (112 MCG TOTAL) BY MOUTH DAILY., Disp: 90 tablet, Rfl: 3 .  Multiple Vitamins-Minerals (HAIR SKIN  AND NAILS FORMULA PO), Take by mouth., Disp: , Rfl:    Allergies  Allergen Reactions  . Penicillin G Anaphylaxis  . Penicillins Rash and Shortness Of Breath  . Topiramate Anxiety   Review of Systems   Pertinent items are noted in the HPI. Otherwise, ROS is negative.  Vitals:  There were no vitals filed for this visit.   There is no height or weight on file to calculate BMI.   Physical Exam:   Physical Exam  Constitutional: She is oriented to person, place, and time. She appears well-developed and well-nourished. No distress.  HENT:  Head: Normocephalic and atraumatic.  Right Ear: External ear normal.  Left Ear: External ear normal.  Nose: Nose normal.  Mouth/Throat: Oropharynx is clear and moist.  Eyes: Conjunctivae and EOM are normal. Pupils are equal, round, and reactive to light.  Neck: Normal range of motion. Neck supple. No thyromegaly present.  Cardiovascular: Normal rate, regular rhythm, normal heart sounds and intact distal pulses.  Pulmonary/Chest: Effort normal and breath sounds normal.  Abdominal: Soft. Bowel sounds are normal.  Musculoskeletal: Normal range of motion.  Lymphadenopathy:    She has no cervical adenopathy.  Neurological: She is alert and oriented to person, place, and time.  Skin: Skin is warm and dry. Capillary refill takes less than 2 seconds.  Psychiatric: She has a normal mood and affect. Her behavior is normal.  Nursing note and vitals reviewed.   Assessment and Plan:  Cassidy Hopkins was seen today for follow-up.  Diagnoses and all orders for this visit:  Obesity (BMI 30-39.9) Comments: Improving. The patient is asked to make an attempt to improve diet and exercise patterns to aid in medical management of this problem.   Generalized anxiety disorder Comments: Improving. She continues Klonopin BID.   Adult ADHD Comments: Improving. Since the last visit has the patient had any:  Appetite changes? Yes Unintentional weight loss? No Is  medication working well ? Yes Does patient take drug holidays? No Difficulties falling to sleep or maintaining sleep? No Any anxiety?  No Any cardiac issues (fainting or paliptations)? No Suicidal thoughts? No Changes in health since last visit? No New medications? No Any illicit substance abuse? No Has the patient taken his medication today? Yes  Orders: -     amphetamine-dextroamphetamine (ADDERALL) 10 MG tablet; Take 1 tablet (10 mg total) by mouth 3 (three) times daily. -     amphetamine-dextroamphetamine (ADDERALL) 10 MG tablet; Take 1 tablet (10 mg total) by mouth 3 (three) times daily. -     amphetamine-dextroamphetamine (ADDERALL) 10 MG tablet; Take 1 tablet (10 mg total) by mouth 3 (three) times daily.    . Reviewed expectations re: course of current medical issues. . Discussed self-management of symptoms. . Outlined signs and symptoms indicating need for more acute intervention. . Patient verbalized understanding and all questions were answered. Marland Kitchen Health Maintenance issues including appropriate healthy diet, exercise, and smoking avoidance were discussed with patient. . See orders for this visit as documented in the electronic medical record. . Patient received an After Visit Summary.  Briscoe Deutscher, DO [place] 05/25/2017  Future Appointments  Date Time Provider [place]  08/22/2017  9:00 AM Briscoe Deutscher, DO [facility]

## 2017-05-24 NOTE — Patient Instructions (Signed)
I'm so glad that you are doing so well.

## 2017-05-25 ENCOUNTER — Encounter: Payer: Self-pay | Admitting: Family Medicine

## 2017-05-25 MED ORDER — AMPHETAMINE-DEXTROAMPHETAMINE 10 MG PO TABS: 10.0000 mg | ORAL_TABLET | Freq: Three times a day (TID) | ORAL | 0 refills | Status: DC

## 2017-05-25 NOTE — Progress Notes (Addendum)
Adderall resent due to e-scribe error. Cassidy Hopkins

## 2017-05-29 DIAGNOSIS — F4312 Post-traumatic stress disorder, chronic: Secondary | ICD-10-CM | POA: Diagnosis not present

## 2017-05-29 DIAGNOSIS — Z1231 Encounter for screening mammogram for malignant neoplasm of breast: Secondary | ICD-10-CM | POA: Diagnosis not present

## 2017-05-29 LAB — HM MAMMOGRAPHY

## 2017-05-30 MED ORDER — AMPHETAMINE-DEXTROAMPHETAMINE 10 MG PO TABS: 10.0000 mg | ORAL_TABLET | Freq: Three times a day (TID) | ORAL | 0 refills | Status: DC

## 2017-05-30 MED ORDER — AMPHETAMINE-DEXTROAMPHETAMINE 10 MG PO TABS
10.0000 mg | ORAL_TABLET | Freq: Three times a day (TID) | ORAL | 0 refills | Status: DC
Start: 2017-05-30 — End: 2017-06-07

## 2017-05-30 NOTE — Addendum Note (Signed)
Addended by: Briscoe Deutscher R on: 05/30/2017 08:04 AM   Modules accepted: Orders

## 2017-06-02 DIAGNOSIS — H938X3 Other specified disorders of ear, bilateral: Secondary | ICD-10-CM | POA: Diagnosis not present

## 2017-06-02 DIAGNOSIS — H66012 Acute suppurative otitis media with spontaneous rupture of ear drum, left ear: Secondary | ICD-10-CM | POA: Diagnosis not present

## 2017-06-04 DIAGNOSIS — H60502 Unspecified acute noninfective otitis externa, left ear: Secondary | ICD-10-CM | POA: Diagnosis not present

## 2017-06-05 ENCOUNTER — Telehealth: Payer: Self-pay | Admitting: Family Medicine

## 2017-06-05 DIAGNOSIS — F909 Attention-deficit hyperactivity disorder, unspecified type: Secondary | ICD-10-CM

## 2017-06-05 DIAGNOSIS — F4312 Post-traumatic stress disorder, chronic: Secondary | ICD-10-CM | POA: Diagnosis not present

## 2017-06-05 NOTE — Telephone Encounter (Signed)
Please see note below and advise regarding medication request

## 2017-06-05 NOTE — Telephone Encounter (Signed)
Okay to give. 

## 2017-06-05 NOTE — Telephone Encounter (Signed)
CRM for notification. See Telephone encounter for:   06/05/17.   Relation to pt: self  Call back number: [phone] [id number] [place] [place] [phone] (Phone) [phone] (Fax)   Reason for call:  Patient is going out of town in 2 days and would like to pick up her  amphetamine-dextroamphetamine (ADDERALL) 10 MG tablet today. Pharmacy requesting prior approval from PCP, please advise

## 2017-06-05 NOTE — Telephone Encounter (Signed)
Ok to give scripts don't think it went through on e scribe.

## 2017-06-06 DIAGNOSIS — H6012 Cellulitis of left external ear: Secondary | ICD-10-CM | POA: Diagnosis not present

## 2017-06-06 DIAGNOSIS — J019 Acute sinusitis, unspecified: Secondary | ICD-10-CM | POA: Diagnosis not present

## 2017-06-06 DIAGNOSIS — H60393 Other infective otitis externa, bilateral: Secondary | ICD-10-CM | POA: Diagnosis not present

## 2017-06-06 DIAGNOSIS — H66003 Acute suppurative otitis media without spontaneous rupture of ear drum, bilateral: Secondary | ICD-10-CM | POA: Diagnosis not present

## 2017-06-07 MED ORDER — AMPHETAMINE-DEXTROAMPHETAMINE 10 MG PO TABS: 10.0000 mg | ORAL_TABLET | Freq: Three times a day (TID) | ORAL | 0 refills | Status: DC

## 2017-06-07 NOTE — Addendum Note (Signed)
Addended by: Francella Solian on: 06/07/2017 07:54 AM   Modules accepted: Orders

## 2017-06-13 ENCOUNTER — Other Ambulatory Visit: Payer: Self-pay | Admitting: Family Medicine

## 2017-06-19 DIAGNOSIS — F4312 Post-traumatic stress disorder, chronic: Secondary | ICD-10-CM | POA: Diagnosis not present

## 2017-06-30 ENCOUNTER — Other Ambulatory Visit: Payer: Self-pay | Admitting: Family Medicine

## 2017-07-01 ENCOUNTER — Other Ambulatory Visit: Payer: Self-pay | Admitting: Family Medicine

## 2017-07-01 DIAGNOSIS — F909 Attention-deficit hyperactivity disorder, unspecified type: Secondary | ICD-10-CM

## 2017-07-02 ENCOUNTER — Encounter: Payer: Self-pay | Admitting: Family Medicine

## 2017-07-02 ENCOUNTER — Other Ambulatory Visit: Payer: Self-pay | Admitting: Family Medicine

## 2017-07-02 DIAGNOSIS — F909 Attention-deficit hyperactivity disorder, unspecified type: Secondary | ICD-10-CM

## 2017-07-02 MED ORDER — AMPHETAMINE-DEXTROAMPHETAMINE 10 MG PO TABS: 10.0000 mg | ORAL_TABLET | Freq: Three times a day (TID) | ORAL | 0 refills | Status: DC

## 2017-07-02 NOTE — Telephone Encounter (Signed)
Ok to refill 

## 2017-07-03 ENCOUNTER — Encounter: Payer: Self-pay | Admitting: Family Medicine

## 2017-07-03 DIAGNOSIS — F909 Attention-deficit hyperactivity disorder, unspecified type: Secondary | ICD-10-CM

## 2017-07-03 DIAGNOSIS — F4312 Post-traumatic stress disorder, chronic: Secondary | ICD-10-CM | POA: Diagnosis not present

## 2017-07-03 MED ORDER — AMPHETAMINE-DEXTROAMPHETAMINE 10 MG PO TABS: 10.0000 mg | ORAL_TABLET | Freq: Three times a day (TID) | ORAL | 0 refills | Status: DC

## 2017-07-03 MED ORDER — AMPHETAMINE-DEXTROAMPHETAMINE 10 MG PO TABS
10.0000 mg | ORAL_TABLET | Freq: Three times a day (TID) | ORAL | 0 refills | Status: DC
Start: 2017-07-03 — End: 2017-09-03

## 2017-07-03 NOTE — Telephone Encounter (Signed)
[facility] 10mg  in [facility]. They stated new RX needs sent directly to different pharmacy and they verified the med is in stock. [facility]:  [facility] [place] [place] (Phone) [phone] (Fax)

## 2017-07-03 NOTE — Telephone Encounter (Signed)
Pt called back, said the escript was sent to wrong pharmacy, it Needs to be sent to  [place], [place] [phone] (Phone) [phone] (Fax

## 2017-07-03 NOTE — Telephone Encounter (Signed)
Please advise 

## 2017-07-14 ENCOUNTER — Other Ambulatory Visit: Payer: Self-pay | Admitting: Family Medicine

## 2017-07-14 DIAGNOSIS — F411 Generalized anxiety disorder: Secondary | ICD-10-CM

## 2017-07-16 NOTE — Telephone Encounter (Signed)
MEDICATION:   [facility]    IS THIS A 90 DAY SUPPLY : no   IS PATIENT OUT OF MEDICATION:   IF NOT; HOW MUCH IS LEFT:   LAST APPOINTMENT DATE: @2 /02/2018  NEXT APPOINTMENT DATE:@4 /07/2017  OTHER COMMENTS:    **Let patient know to contact pharmacy at the end of the day to make sure medication is ready. **  ** Please notify patient to allow 48-72 hours to process**  **Encourage patient to contact the pharmacy for refills or they can request refills through [facility]**

## 2017-07-17 ENCOUNTER — Encounter: Payer: Self-pay | Admitting: Family Medicine

## 2017-07-17 DIAGNOSIS — F4312 Post-traumatic stress disorder, chronic: Secondary | ICD-10-CM | POA: Diagnosis not present

## 2017-07-23 ENCOUNTER — Encounter: Payer: Self-pay | Admitting: Family Medicine

## 2017-07-23 ENCOUNTER — Other Ambulatory Visit: Payer: Self-pay | Admitting: Family Medicine

## 2017-07-23 DIAGNOSIS — F909 Attention-deficit hyperactivity disorder, unspecified type: Secondary | ICD-10-CM

## 2017-07-23 NOTE — Telephone Encounter (Signed)
Just filled

## 2017-07-23 NOTE — Telephone Encounter (Signed)
Please advise on refill.

## 2017-07-23 NOTE — Telephone Encounter (Signed)
Called the pharmacy and patient still has two on fill. She will go pick up.

## 2017-07-31 DIAGNOSIS — F4312 Post-traumatic stress disorder, chronic: Secondary | ICD-10-CM | POA: Diagnosis not present

## 2017-08-07 DIAGNOSIS — F4312 Post-traumatic stress disorder, chronic: Secondary | ICD-10-CM | POA: Diagnosis not present

## 2017-08-14 DIAGNOSIS — F4312 Post-traumatic stress disorder, chronic: Secondary | ICD-10-CM | POA: Diagnosis not present

## 2017-08-17 DIAGNOSIS — J101 Influenza due to other identified influenza virus with other respiratory manifestations: Secondary | ICD-10-CM | POA: Diagnosis not present

## 2017-08-17 DIAGNOSIS — R6889 Other general symptoms and signs: Secondary | ICD-10-CM | POA: Diagnosis not present

## 2017-08-17 DIAGNOSIS — J029 Acute pharyngitis, unspecified: Secondary | ICD-10-CM | POA: Diagnosis not present

## 2017-08-17 DIAGNOSIS — R05 Cough: Secondary | ICD-10-CM | POA: Diagnosis not present

## 2017-08-22 ENCOUNTER — Ambulatory Visit: Payer: Federal, State, Local not specified - PPO | Admitting: Family Medicine

## 2017-08-28 DIAGNOSIS — F4312 Post-traumatic stress disorder, chronic: Secondary | ICD-10-CM | POA: Diagnosis not present

## 2017-09-03 ENCOUNTER — Ambulatory Visit: Payer: Federal, State, Local not specified - PPO | Admitting: Family Medicine

## 2017-09-03 ENCOUNTER — Encounter: Payer: Self-pay | Admitting: Family Medicine

## 2017-09-03 VITALS — BP 116/78 | HR 101 | Temp 98.1°F | Ht 69.0 in | Wt 222.2 lb

## 2017-09-03 DIAGNOSIS — E669 Obesity, unspecified: Secondary | ICD-10-CM | POA: Diagnosis not present

## 2017-09-03 DIAGNOSIS — E039 Hypothyroidism, unspecified: Secondary | ICD-10-CM | POA: Diagnosis not present

## 2017-09-03 DIAGNOSIS — F909 Attention-deficit hyperactivity disorder, unspecified type: Secondary | ICD-10-CM | POA: Diagnosis not present

## 2017-09-03 LAB — COMPREHENSIVE METABOLIC PANEL
ALT: 14 U/L (ref 0–35)
AST: 15 U/L (ref 0–37)
Albumin: 4.1 g/dL (ref 3.5–5.2)
Alkaline Phosphatase: 114 U/L (ref 39–117)
BUN: 9 mg/dL (ref 6–23)
CO2: 28 mEq/L (ref 19–32)
Calcium: 9.6 mg/dL (ref 8.4–10.5)
Chloride: 100 mEq/L (ref 96–112)
Creatinine, Ser: 0.86 mg/dL (ref 0.40–1.20)
GFR: 76.19 mL/min (ref >60.00)
Glucose, Bld: 109 mg/dL — ABNORMAL HIGH (ref 70–99)
Potassium: 3.5 mEq/L (ref 3.5–5.1)
Sodium: 137 mEq/L (ref 135–145)
Total Bilirubin: 0.6 mg/dL (ref 0.2–1.2)
Total Protein: 7.1 g/dL (ref 6.0–8.3)

## 2017-09-03 LAB — CBC WITH DIFFERENTIAL/PLATELET
Basophils Absolute: 0.1 10*3/uL (ref 0.0–0.1)
Basophils Relative: 2.2 % (ref 0.0–3.0)
Eosinophils Absolute: 0.1 10*3/uL (ref 0.0–0.7)
Eosinophils Relative: 2.1 % (ref 0.0–5.0)
HCT: 43.5 % (ref 36.0–46.0)
Hemoglobin: 14.9 g/dL (ref 12.0–15.0)
Lymphocytes Relative: 34.8 % (ref 12.0–46.0)
Lymphs Abs: 1.9 10*3/uL (ref 0.7–4.0)
MCHC: 34.4 g/dL (ref 30.0–36.0)
MCV: 88.8 fl (ref 78.0–100.0)
Monocytes Absolute: 0.4 10*3/uL (ref 0.1–1.0)
Monocytes Relative: 7.6 % (ref 3.0–12.0)
Neutro Abs: 3 10*3/uL (ref 1.4–7.7)
Neutrophils Relative %: 53.3 % (ref 43.0–77.0)
Platelets: 392 10*3/uL (ref 150.0–400.0)
RBC: 4.9 Mil/uL (ref 3.87–5.11)
RDW: 13 % (ref 11.5–15.5)
WBC: 5.6 10*3/uL (ref 4.0–10.5)

## 2017-09-03 LAB — TSH: TSH: 2.17 u[IU]/mL (ref 0.35–4.50)

## 2017-09-03 LAB — T4, FREE: Free T4: 0.95 ng/dL (ref 0.60–1.60)

## 2017-09-03 MED ORDER — AMPHETAMINE-DEXTROAMPHETAMINE 10 MG PO TABS: 10.0000 mg | ORAL_TABLET | Freq: Three times a day (TID) | ORAL | 0 refills | Status: DC

## 2017-09-03 NOTE — Progress Notes (Signed)
Cassidy Hopkins is a 42 y.o. female is here for follow up.  History of Present Illness:   Cassidy Hopkins CMA acting as scribe for Dr. Juleen China.  HPI: Patient come in today for her 3 month follow up for her medications. She has been tolerating medications very well. She has been under a lot of stress. She is down 5 pounds since last visit.   Since the last visit has the patient had any:  Appetite changes? No Unintentional weight loss? No Is medication working well ? Yes Does patient take drug holidays? Yes Difficulties falling to sleep or maintaining sleep? No Any anxiety?  Yes Any cardiac issues (fainting or paliptations)? No Suicidal thoughts? No Changes in health since last visit? Yes New medications? No Any illicit substance abuse? No Has the patient taken his medication today? Yes  There are no preventive care reminders to display for this patient.   Depression screen PHQ 2/9 01/23/2017  Decreased Interest 1  Down, Depressed, Hopeless 2  PHQ - 2 Score 3  Altered sleeping 0  Tired, decreased energy 1  Change in appetite 1  Feeling bad or failure about yourself  3  Trouble concentrating 3  Moving slowly or fidgety/restless 1  Suicidal thoughts 0  PHQ-9 Score 12  Difficult doing work/chores Somewhat difficult   PMHx, SurgHx, SocialHx, FamHx, Medications, and Allergies were reviewed in the Visit Navigator and updated as appropriate.   Patient Active Problem List   Diagnosis Date Noted  . Multiple lipomas 07/07/2016  . Hx of tubal ligation 07/06/2016  . Generalized anxiety disorder 03/31/2016  . Hypothyroidism 01/17/2016  . Pseudotumor cerebri 01/17/2016  . Obesity (BMI 30-39.9) 01/17/2016   Social History   Tobacco Use  . Smoking status: Never Smoker  . Smokeless tobacco: Never Used  Substance Use Topics  . Alcohol use: No  . Drug use: No   Review of Systems  Constitutional: Negative for chills and fever.  HENT: Negative for congestion and ear pain.   Eyes:  Negative for blurred vision and double vision.  Respiratory: Negative for cough and shortness of breath.   Cardiovascular: Negative for chest pain and palpitations.  Gastrointestinal: Negative for abdominal pain and nausea.  Genitourinary: Negative for dysuria.  Musculoskeletal: Negative for back pain and neck pain.  Neurological: Negative for dizziness.  Psychiatric/Behavioral: Negative for suicidal ideas.    Current Medications and Allergies:   .  acetaZOLAMIDE (DIAMOX) 250 MG tablet, TAKE 1 TABLET BY MOUTH EVERY DAY, Disp: 30 tablet, Rfl: 2 .  amphetamine-dextroamphetamine (ADDERALL) 10 MG tablet, Take 1 tablet (10 mg total) by mouth 3 (three) times daily., Disp: 90 tablet, Rfl: 0 .  amphetamine-dextroamphetamine (ADDERALL) 10 MG tablet, Take 1 tablet (10 mg total) by mouth 3 (three) times daily., Disp: 90 tablet, Rfl: 0 .  amphetamine-dextroamphetamine (ADDERALL) 10 MG tablet, Take 1 tablet (10 mg total) by mouth 3 (three) times daily., Disp: 90 tablet, Rfl: 0 .  cetirizine (ZYRTEC) 10 MG tablet, Take by mouth., Disp: , Rfl:  .  clonazePAM (KLONOPIN) 0.5 MG tablet, TAKE 1 TABLET BY MOUTH TWICE A DAY AS NEEDED FOR ANXIETY, Disp: 60 tablet, Rfl: 1 .  etodolac (LODINE) 500 MG tablet, Take 500 mg by mouth 2 (two) times daily., Disp: , Rfl: 1 .  hydrochlorothiazide (HYDRODIURIL) 25 MG tablet, TAKE 1 TABLET BY MOUTH EVERY DAY, Disp: 30 tablet, Rfl: 4 .  levothyroxine (SYNTHROID, LEVOTHROID) 112 MCG tablet, TAKE 1 TABLET (112 MCG TOTAL) BY MOUTH DAILY., Disp: 90 tablet,  Rfl: 3 .  Multiple Vitamins-Minerals (HAIR SKIN AND NAILS FORMULA PO), Take by mouth., Disp: , Rfl:    Allergies  Allergen Reactions  . Penicillin G Anaphylaxis  . Penicillins Rash and Shortness Of Breath  . Topiramate Anxiety   Review of Systems   Pertinent items are noted in the HPI. Otherwise, ROS is negative.  Vitals:   Vitals:   09/03/17 0852  BP: 116/78  Pulse: (!) 101  Temp: 98.1 F (36.7 C)  TempSrc:  Oral  SpO2: 99%  Weight: 222 lb 3.2 oz (100.8 kg)  Height: 5\' 9"  (1.753 m)     Body mass index is 32.81 kg/m.   Physical Exam:   Physical Exam  Constitutional: She appears well-nourished.  HENT:  Head: Normocephalic and atraumatic.  Eyes: Pupils are equal, round, and reactive to light. EOM are normal.  Neck: Normal range of motion. Neck supple.  Cardiovascular: Normal rate, regular rhythm, normal heart sounds and intact distal pulses.  Pulmonary/Chest: Effort normal.  Abdominal: Soft.  Skin: Skin is warm.  Psychiatric: She has a normal mood and affect. Her behavior is normal.  Nursing note and vitals reviewed.    Assessment and Plan:   [name] was seen today for medication refill.  Diagnoses and all orders for this visit:  Adult ADHD -     amphetamine-dextroamphetamine (ADDERALL) 10 MG tablet; Take 1 tablet (10 mg total) by mouth 3 (three) times daily. -     amphetamine-dextroamphetamine (ADDERALL) 10 MG tablet; Take 1 tablet (10 mg total) by mouth 3 (three) times daily. -     amphetamine-dextroamphetamine (ADDERALL) 10 MG tablet; Take 1 tablet (10 mg total) by mouth 3 (three) times daily.  Obesity (BMI 30-39.9) -     CBC with Differential/Platelet -     Comprehensive metabolic panel  Hypothyroidism, unspecified type -     TSH -     T4, free   . Reviewed expectations re: course of current medical issues. . Discussed self-management of symptoms. . Outlined signs and symptoms indicating need for more acute intervention. . Patient verbalized understanding and all questions were answered. Marland Kitchen Health Maintenance issues including appropriate healthy diet, exercise, and smoking avoidance were discussed with patient. . See orders for this visit as documented in the electronic medical record. . Patient received an After Visit Summary.  Briscoe Deutscher, DO [place] 09/03/2017  Future Appointments  Date Time Provider [place]  12/03/2017  9:00 AM  Briscoe Deutscher, DO [facility]

## 2017-09-05 DIAGNOSIS — F4312 Post-traumatic stress disorder, chronic: Secondary | ICD-10-CM | POA: Diagnosis not present

## 2017-09-10 ENCOUNTER — Other Ambulatory Visit: Payer: Self-pay | Admitting: Family Medicine

## 2017-09-10 DIAGNOSIS — F411 Generalized anxiety disorder: Secondary | ICD-10-CM

## 2017-09-10 NOTE — Telephone Encounter (Signed)
Please advise on refill.

## 2017-09-11 DIAGNOSIS — F4312 Post-traumatic stress disorder, chronic: Secondary | ICD-10-CM | POA: Diagnosis not present

## 2017-09-15 ENCOUNTER — Ambulatory Visit
Admission: EM | Admit: 2017-09-15 | Discharge: 2017-09-15 | Disposition: A | Discharge status: Home / Self Care | Payer: Federal, State, Local not specified - PPO | Attending: Family Medicine | Admitting: Family Medicine

## 2017-09-15 ENCOUNTER — Encounter: Payer: Self-pay | Admitting: Gynecology

## 2017-09-15 DIAGNOSIS — H60501 Unspecified acute noninfective otitis externa, right ear: Secondary | ICD-10-CM

## 2017-09-15 DIAGNOSIS — H9203 Otalgia, bilateral: Secondary | ICD-10-CM | POA: Diagnosis not present

## 2017-09-15 MED ORDER — CIPROFLOXACIN-DEXAMETHASONE 0.3-0.1 % OT SUSP: OTIC | 0 refills | Status: DC

## 2017-09-15 NOTE — ED Triage Notes (Signed)
Patient c/o left ear pain x yesterday.

## 2017-09-15 NOTE — Discharge Instructions (Signed)
Medication as prescribed.  Take care  Dr. Mareon Robinette  

## 2017-09-15 NOTE — ED Provider Notes (Signed)
[facility]   CSN: [id number] Arrival date & time: 09/15/17  0907  History   Chief Complaint Chief Complaint  Patient presents with  . Otalgia   HPI  42 year old female presents with otalgia.  Started yesterday. Bilateral ear pain.  Severe.  Kept her up at night.  She reports that she has had ear infections in the past.  She states that she used leftover eardrops and had no significant improvement.  Reports of drainage from the ear.  No other associated symptoms.  No other complaints.  Past Medical History:  Diagnosis Date  . Breast cancer ([place]) 1997  . Hyperlipidemia   . Hypertension   . Irregular heart rate   . Migraines   . Thyroid disease    Patient Active Problem List   Diagnosis Date Noted  . Multiple lipomas 07/07/2016  . Hx of tubal ligation 07/06/2016  . Generalized anxiety disorder 03/31/2016  . Hypothyroidism 01/17/2016  . Pseudotumor cerebri 01/17/2016  . Obesity (BMI 30-39.9) 01/17/2016   Past Surgical History:  Procedure Laterality Date  . BREAST SURGERY    . CESAREAN SECTION  2009, 2014, 2016  . CHOLECYSTECTOMY  1997    OB History   None    Home Medications    Prior to Admission medications   Medication Sig Start Date End Date Taking? Authorizing Provider  acetaZOLAMIDE (DIAMOX) 250 MG tablet TAKE 1 TABLET BY MOUTH EVERY DAY 07/02/17  Yes Briscoe Deutscher, DO  amphetamine-dextroamphetamine (ADDERALL) 10 MG tablet Take 1 tablet (10 mg total) by mouth 3 (three) times daily. 09/03/17 10/03/17 Yes Briscoe Deutscher, DO  amphetamine-dextroamphetamine (ADDERALL) 10 MG tablet Take 1 tablet (10 mg total) by mouth 3 (three) times daily. 10/03/17 11/03/17 Yes Briscoe Deutscher, DO  amphetamine-dextroamphetamine (ADDERALL) 10 MG tablet Take 1 tablet (10 mg total) by mouth 3 (three) times daily. 11/03/17 12/03/17 Yes Briscoe Deutscher, DO  cetirizine (ZYRTEC) 10 MG tablet Take by mouth.   Yes [provider]  clonazePAM (KLONOPIN) 0.5 MG tablet TAKE 1  TABLET BY MOUTH TWICE A DAY AS NEEDED FOR ANXIETY 09/10/17  Yes Briscoe Deutscher, DO  etodolac (LODINE) 500 MG tablet Take 500 mg by mouth 2 (two) times daily. 03/01/17  Yes [provider]  hydrochlorothiazide (HYDRODIURIL) 25 MG tablet TAKE 1 TABLET BY MOUTH EVERY DAY 06/14/17  Yes Briscoe Deutscher, DO  levothyroxine (SYNTHROID, LEVOTHROID) 112 MCG tablet TAKE 1 TABLET (112 MCG TOTAL) BY MOUTH DAILY. 05/16/17  Yes Briscoe Deutscher, DO  Multiple Vitamins-Minerals (HAIR SKIN AND NAILS FORMULA PO) Take by mouth.   Yes [provider]  ciprofloxacin-dexamethasone (CIPRODEX) OTIC suspension 4 drops to the affected ear(s) twice daily for 7 days. 09/15/17   Coral Spikes, DO   Family History Family History  Problem Relation Age of Onset  . Hypertension Mother   . Hyperlipidemia Mother   . Hyperlipidemia Father   . Heart disease Father   . Hypertension Father   . Diabetes Father   . Colon cancer Maternal Grandmother   . Hyperlipidemia Maternal Grandmother    Social History Social History   Tobacco Use  . Smoking status: Never Smoker  . Smokeless tobacco: Never Used  Substance Use Topics  . Alcohol use: No  . Drug use: No   Allergies   Penicillin g; Penicillins; and Topiramate   Review of Systems Review of Systems  Constitutional: Negative.   HENT: Positive for ear pain.    Physical Exam Triage Vital Signs ED Triage Vitals  Enc Vitals Group     BP 09/15/17 0916 117/81     Pulse --      Resp 09/15/17 0916 18     Temp 09/15/17 0916 98.3 F (36.8 C)     Temp Source 09/15/17 0916 Oral     SpO2 09/15/17 0916 100 %     Weight 09/15/17 0917 222 lb (100.7 kg)     Height --      Head Circumference --      Peak Flow --      Pain Score 09/15/17 0915 5     Pain Loc --      Pain Edu? --      Excl. in [place]? --    Updated Vital Signs BP 117/81 (BP Location: Left Arm)   Temp 98.3 F (36.8 C) (Oral)   Resp 18   Wt 222 lb (100.7 kg)   LMP 08/15/2017   SpO2 100%   BMI  32.78 kg/m   Physical Exam  Constitutional: She is oriented to person, place, and time. She appears well-developed. No distress.  HENT:  Head: Normocephalic and atraumatic.  Left ear - Normal.  Right ear - canal edema and erythema. Normal TM.  Pulmonary/Chest: Effort normal. No respiratory distress.  Neurological: She is alert and oriented to person, place, and time.  Psychiatric: She has a normal mood and affect. Her behavior is normal.  Nursing note and vitals reviewed.  UC Treatments / Results  Labs (all labs ordered are listed, but only abnormal results are displayed) Labs Reviewed - No data to display  EKG None Radiology No results found.  Procedures Procedures (including critical care time)  Medications Ordered in UC Medications - No data to display   Initial Impression / Assessment and Plan / UC Course  I have reviewed the triage vital signs and the nursing notes.  Pertinent labs & imaging results that were available during my care of the patient were reviewed by me and considered in my medical decision making (see chart for details).    42 year old female presents with otitis externa. Treating with Ciprodex.  Final Clinical Impressions(s) / UC Diagnoses   Final diagnoses:  Acute otitis externa of right ear, unspecified type    ED Discharge Orders        Ordered    ciprofloxacin-dexamethasone (CIPRODEX) OTIC suspension     09/15/17 0932     Controlled Substance Prescriptions [place] Controlled Substance Registry consulted? Not Applicable   Coral Spikes, [place] 09/15/17 4854

## 2017-09-18 DIAGNOSIS — F4312 Post-traumatic stress disorder, chronic: Secondary | ICD-10-CM | POA: Diagnosis not present

## 2017-09-25 DIAGNOSIS — F4312 Post-traumatic stress disorder, chronic: Secondary | ICD-10-CM | POA: Diagnosis not present

## 2017-09-27 ENCOUNTER — Other Ambulatory Visit: Payer: Self-pay | Admitting: Family Medicine

## 2017-10-10 DIAGNOSIS — K080 Exfoliation of teeth due to systemic causes: Secondary | ICD-10-CM | POA: Diagnosis not present

## 2017-10-18 ENCOUNTER — Telehealth: Payer: Self-pay | Admitting: Family Medicine

## 2017-10-18 DIAGNOSIS — F909 Attention-deficit hyperactivity disorder, unspecified type: Secondary | ICD-10-CM

## 2017-10-18 MED ORDER — AMPHETAMINE-DEXTROAMPHETAMINE 10 MG PO TABS: 10.0000 mg | ORAL_TABLET | Freq: Three times a day (TID) | ORAL | 0 refills | Status: DC

## 2017-10-18 NOTE — Telephone Encounter (Signed)
Copied from [place] [id number]. Topic: Quick Communication - Rx Refill/Question >> Oct 18, 2017  8:17 AM Cassidy Hopkins wrote: Medication: adderall - pt has moved and needs to change pharmacies - she was told that [facility] [place] cannot transfer [facility]der[facility] - she was advised they have 2 on file for her - pt would like them to be sent to the new pharmacy and cancelled from the old pharmacy Has the patient contacted their pharmacy? yes Preferred Pharmacy (with phone number or street name): [id number] [place], [place] [phone] (Phone) [phone] (Fax)

## 2017-10-18 NOTE — Telephone Encounter (Signed)
Medication sent to new pharmacy. Okay to cancel other.

## 2017-10-18 NOTE — Telephone Encounter (Signed)
See note

## 2017-10-18 NOTE — Telephone Encounter (Signed)
Ok to fill? I will call pharmacy and c/a old ones

## 2017-10-23 DIAGNOSIS — F4312 Post-traumatic stress disorder, chronic: Secondary | ICD-10-CM | POA: Diagnosis not present

## 2017-10-30 ENCOUNTER — Other Ambulatory Visit: Payer: Self-pay | Admitting: Family Medicine

## 2017-10-30 DIAGNOSIS — F4312 Post-traumatic stress disorder, chronic: Secondary | ICD-10-CM | POA: Diagnosis not present

## 2017-11-04 ENCOUNTER — Other Ambulatory Visit: Payer: Self-pay | Admitting: Family Medicine

## 2017-11-08 ENCOUNTER — Encounter: Payer: Self-pay | Admitting: Family Medicine

## 2017-11-09 ENCOUNTER — Other Ambulatory Visit: Payer: Self-pay | Admitting: Surgical

## 2017-11-09 DIAGNOSIS — F411 Generalized anxiety disorder: Secondary | ICD-10-CM

## 2017-11-09 MED ORDER — CLONAZEPAM 0.5 MG PO TABS: ORAL_TABLET | ORAL | 1 refills | Status: DC

## 2017-11-13 DIAGNOSIS — F4312 Post-traumatic stress disorder, chronic: Secondary | ICD-10-CM | POA: Diagnosis not present

## 2017-11-26 DIAGNOSIS — K080 Exfoliation of teeth due to systemic causes: Secondary | ICD-10-CM | POA: Diagnosis not present

## 2017-11-27 DIAGNOSIS — F4312 Post-traumatic stress disorder, chronic: Secondary | ICD-10-CM | POA: Diagnosis not present

## 2017-12-02 ENCOUNTER — Other Ambulatory Visit: Payer: Self-pay | Admitting: Family Medicine

## 2017-12-02 NOTE — Progress Notes (Signed)
Cassidy Hopkins is a 42 y.o. female is here for follow up.  History of Present Illness:   HPI: Doing well. Moved from [place] to [place] for better schools. Weekly therapy has been helpful. Still struggling with daily social anxiety. Gained some weight - eating more at night. Ready to change. Will focus on emotional eating. Going on vacation this week with husband.  There are no preventive care reminders to display for this patient.   Depression screen [facility] 2/9 12/03/2017 01/23/2017  Decreased Interest 0 1  Down, Depressed, Hopeless 1 2  PHQ - 2 Score 1 3  Altered sleeping 0 0  Tired, decreased energy 1 1  Change in appetite 1 1  Feeling bad or failure about yourself  1 3  Trouble concentrating 1 3  Moving slowly or fidgety/restless 0 1  Suicidal thoughts 0 0  PHQ-9 Score 5 12  Difficult doing work/chores Not difficult at all Somewhat difficult   PMHx, SurgHx, SocialHx, FamHx, Medications, and Allergies were reviewed in the Visit Navigator and updated as appropriate.   Patient Active Problem List   Diagnosis Date Noted  . Adult ADHD 12/03/2017  . Social anxiety disorder 12/03/2017  . Multiple lipomas 07/07/2016  . Hx of tubal ligation 07/06/2016  . Generalized anxiety disorder 03/31/2016  . Hypothyroidism 01/17/2016  . Pseudotumor cerebri 01/17/2016  . Obesity (BMI 30-39.9) 01/17/2016   Social History   Tobacco Use  . Smoking status: Never Smoker  . Smokeless tobacco: Never Used  Substance Use Topics  . Alcohol use: No  . Drug use: No   Current Medications and Allergies:   Current Outpatient Medications:  .  acetaZOLAMIDE (DIAMOX) 250 MG tablet, TAKE 1 TABLET BY MOUTH EVERY DAY, Disp: 30 tablet, Rfl: 1 .  amphetamine-dextroamphetamine (ADDERALL) 10 MG tablet, Take 1 tablet (10 mg total) by mouth 3 (three) times daily., Disp: 90 tablet, Rfl: 0 .  [START ON 01/03/2018] amphetamine-dextroamphetamine (ADDERALL) 10 MG tablet, Take 1 tablet (10 mg total) by mouth 3  (three) times daily., Disp: 90 tablet, Rfl: 0 .  amphetamine-dextroamphetamine (ADDERALL) 10 MG tablet, Take 1 tablet (10 mg total) by mouth 3 (three) times daily., Disp: 90 tablet, Rfl: 0 .  cetirizine (ZYRTEC) 10 MG tablet, Take by mouth., Disp: , Rfl:  .  clonazePAM (KLONOPIN) 0.5 MG tablet, TAKE 1 TABLET BY MOUTH TWICE A DAY AS NEEDED FOR ANXIETY, Disp: 60 tablet, Rfl: 1 .  hydrochlorothiazide (HYDRODIURIL) 25 MG tablet, TAKE 1 TABLET BY MOUTH EVERY DAY, Disp: 30 tablet, Rfl: 4 .  levothyroxine (SYNTHROID, LEVOTHROID) 112 MCG tablet, TAKE 1 TABLET (112 MCG TOTAL) BY MOUTH DAILY., Disp: 90 tablet, Rfl: 3 .  Multiple Vitamins-Minerals (HAIR SKIN AND NAILS FORMULA PO), Take by mouth., Disp: , Rfl:    Allergies  Allergen Reactions  . Penicillins Rash and Shortness Of Breath  . Topiramate Anxiety   Review of Systems   Pertinent items are noted in the HPI. Otherwise, ROS is negative.  Vitals:   Vitals:   12/03/17 0840  BP: 108/72  Pulse: 94  Temp: 98.2 F (36.8 C)  TempSrc: Oral  SpO2: 99%  Weight: 229 lb 3.2 oz (104 kg)  Height: 5\' 9"  (1.753 m)     Body mass index is 33.85 kg/m.  Physical Exam:   Physical Exam  Constitutional: She appears well-nourished.  HENT:  Head: Normocephalic and atraumatic.  Eyes: Pupils are equal, round, and reactive to light. EOM are normal.  Neck: Normal range of motion. Neck supple.  Cardiovascular: Normal rate, regular rhythm, normal heart sounds and intact distal pulses.  Pulmonary/Chest: Effort normal.  Abdominal: Soft.  Skin: Skin is warm.  Psychiatric: She has a normal mood and affect. Her behavior is normal.  Nursing note and vitals reviewed.  Assessment and Plan:   Cassidy Hopkins was seen today for follow-up.  Diagnoses and all orders for this visit:  Adult ADHD Comments: Doing well. Orders: -     amphetamine-dextroamphetamine (ADDERALL) 10 MG tablet; Take 1 tablet (10 mg total) by mouth 3 (three) times daily. -      amphetamine-dextroamphetamine (ADDERALL) 10 MG tablet; Take 1 tablet (10 mg total) by mouth 3 (three) times daily. -     amphetamine-dextroamphetamine (ADDERALL) 10 MG tablet; Take 1 tablet (10 mg total) by mouth 3 (three) times daily.  Obesity (BMI 30-39.9)  Social anxiety disorder Comments: Followed by therapist. Seeing her weekly.    . Reviewed expectations re: course of current medical issues. . Discussed self-management of symptoms. . Outlined signs and symptoms indicating need for more acute intervention. . Patient verbalized understanding and all questions were answered. Marland Kitchen Health Maintenance issues including appropriate healthy diet, exercise, and smoking avoidance were discussed with patient. . See orders for this visit as documented in the electronic medical record. . Patient received an After Visit Summary.  Briscoe Deutscher, DO [place] 12/03/2017  Future Appointments  Date Time Provider [place]  03/04/2018  8:00 AM Briscoe Deutscher, DO [facility]   CMA served as scribe during this visit. History, Physical, and Plan performed by medical provider. The above documentation has been reviewed and is accurate and complete. Briscoe Deutscher, D.O.

## 2017-12-03 ENCOUNTER — Encounter: Payer: Self-pay | Admitting: Family Medicine

## 2017-12-03 ENCOUNTER — Ambulatory Visit: Payer: Federal, State, Local not specified - PPO | Admitting: Family Medicine

## 2017-12-03 VITALS — BP 108/72 | HR 94 | Temp 98.2°F | Ht 69.0 in | Wt 229.2 lb

## 2017-12-03 DIAGNOSIS — E669 Obesity, unspecified: Secondary | ICD-10-CM | POA: Diagnosis not present

## 2017-12-03 DIAGNOSIS — F909 Attention-deficit hyperactivity disorder, unspecified type: Secondary | ICD-10-CM

## 2017-12-03 DIAGNOSIS — F4010 Social phobia, unspecified: Secondary | ICD-10-CM

## 2017-12-03 MED ORDER — AMPHETAMINE-DEXTROAMPHETAMINE 10 MG PO TABS: 10.0000 mg | ORAL_TABLET | Freq: Three times a day (TID) | ORAL | 0 refills | Status: DC

## 2017-12-18 DIAGNOSIS — F4312 Post-traumatic stress disorder, chronic: Secondary | ICD-10-CM | POA: Diagnosis not present

## 2018-01-01 DIAGNOSIS — F4312 Post-traumatic stress disorder, chronic: Secondary | ICD-10-CM | POA: Diagnosis not present

## 2018-01-02 ENCOUNTER — Encounter: Payer: Self-pay | Admitting: Family Medicine

## 2018-01-02 DIAGNOSIS — F411 Generalized anxiety disorder: Secondary | ICD-10-CM

## 2018-01-03 NOTE — Telephone Encounter (Signed)
Last o/v 12/03/17 F/u 03/04/18 Last script 11/09/17 #60  1 rf  Pt does not have contract or UDS I have made note in f/u app to make sure we do that at next app.

## 2018-01-04 ENCOUNTER — Other Ambulatory Visit: Payer: Self-pay | Admitting: Family Medicine

## 2018-01-04 DIAGNOSIS — F411 Generalized anxiety disorder: Secondary | ICD-10-CM

## 2018-01-04 MED ORDER — CLONAZEPAM 0.5 MG PO TABS: ORAL_TABLET | ORAL | 1 refills | Status: DC

## 2018-01-04 MED ORDER — CLONAZEPAM 0.5 MG PO TABS: ORAL_TABLET | ORAL | 2 refills | Status: DC

## 2018-01-06 ENCOUNTER — Other Ambulatory Visit: Payer: Self-pay | Admitting: Family Medicine

## 2018-01-07 NOTE — Telephone Encounter (Signed)
Ok to fill 

## 2018-01-15 DIAGNOSIS — F4312 Post-traumatic stress disorder, chronic: Secondary | ICD-10-CM | POA: Diagnosis not present

## 2018-01-22 DIAGNOSIS — F4312 Post-traumatic stress disorder, chronic: Secondary | ICD-10-CM | POA: Diagnosis not present

## 2018-01-27 ENCOUNTER — Other Ambulatory Visit: Payer: Self-pay | Admitting: Family Medicine

## 2018-01-29 DIAGNOSIS — F4312 Post-traumatic stress disorder, chronic: Secondary | ICD-10-CM | POA: Diagnosis not present

## 2018-02-02 ENCOUNTER — Other Ambulatory Visit: Payer: Self-pay | Admitting: Family Medicine

## 2018-02-04 NOTE — Telephone Encounter (Signed)
Last script 01/07/18 #30 1 rf  Last app 12/03/17 ADHD f/u  F/u 03/04/18

## 2018-02-12 DIAGNOSIS — F4312 Post-traumatic stress disorder, chronic: Secondary | ICD-10-CM | POA: Diagnosis not present

## 2018-02-15 ENCOUNTER — Encounter: Payer: Self-pay | Admitting: Family Medicine

## 2018-02-20 ENCOUNTER — Ambulatory Visit (INDEPENDENT_AMBULATORY_CARE_PROVIDER_SITE_OTHER): Payer: Federal, State, Local not specified - PPO

## 2018-02-20 ENCOUNTER — Encounter: Payer: Self-pay | Admitting: Family Medicine

## 2018-02-20 ENCOUNTER — Telehealth: Payer: Self-pay | Admitting: Family Medicine

## 2018-02-20 ENCOUNTER — Ambulatory Visit: Payer: Federal, State, Local not specified - PPO | Admitting: Family Medicine

## 2018-02-20 VITALS — BP 110/68 | HR 90 | Temp 98.6°F | Ht 69.0 in | Wt 239.0 lb

## 2018-02-20 DIAGNOSIS — M797 Fibromyalgia: Secondary | ICD-10-CM

## 2018-02-20 DIAGNOSIS — M5442 Lumbago with sciatica, left side: Secondary | ICD-10-CM

## 2018-02-20 DIAGNOSIS — M5441 Lumbago with sciatica, right side: Secondary | ICD-10-CM

## 2018-02-20 DIAGNOSIS — M545 Low back pain: Secondary | ICD-10-CM | POA: Diagnosis not present

## 2018-02-20 LAB — POCT URINE PREGNANCY: Preg Test, Ur: NEGATIVE

## 2018-02-20 MED ORDER — HYDROCODONE-ACETAMINOPHEN 5-325 MG PO TABS: 1.0000 | ORAL_TABLET | Freq: Four times a day (QID) | ORAL | 0 refills | Status: DC | PRN

## 2018-02-20 MED ORDER — MELOXICAM 15 MG PO TABS
15.0000 mg | ORAL_TABLET | Freq: Every day | ORAL | 0 refills | Status: DC
Start: 2018-02-20 — End: 2018-03-04

## 2018-02-20 MED ORDER — BACLOFEN 20 MG PO TABS: 20.0000 mg | ORAL_TABLET | Freq: Three times a day (TID) | ORAL | 0 refills | Status: DC

## 2018-02-20 NOTE — Telephone Encounter (Signed)
Copied from [id number]. Topic: General - Other >> Feb 20, 2018  7:39 AM Keene Breath wrote: Reason for CRM: Patient called to request that the nurse call her back regarding her pinched nerve.  She is in a lot of pain and needs some guidance on what who she should see and whether she should come in sooner than her 03/04/18 appointment.  CB# [phone].

## 2018-02-20 NOTE — Telephone Encounter (Signed)
Pt has app today

## 2018-02-20 NOTE — Telephone Encounter (Signed)
See note

## 2018-02-20 NOTE — Progress Notes (Signed)
Cassidy Hopkins is a 42 y.o. female here for an acute visit.  History of Present Illness:   Cassidy Hopkins, CMA acting as scribe for Dr. Briscoe Deutscher.   HPI: Patient in office for evaluation of back pain for eight days. It is lower back right greater than left. Pain goes from back to back of thigh outside of calf to outside of foot. Pain is a burning that feels like her leg is on fire. Pain level in the morning is 8/10 but right now 5/10. Pain increases with siting or changing positions. Laying down helps with pain. She has been taking ibuprofen 800mg  in the morning. She has tried heat and stretching that has helped when she has this pian in the past but is not helping now.    PMHx, SurgHx, SocialHx, Medications, and Allergies were reviewed in the Visit Navigator and updated as appropriate.  Current Medications:   .  acetaZOLAMIDE (DIAMOX) 250 MG tablet, TAKE 1 TABLET BY MOUTH EVERY DAY, Disp: 30 tablet, Rfl: 1 .  cetirizine (ZYRTEC) 10 MG tablet, Take by mouth., Disp: , Rfl:  .  clonazePAM (KLONOPIN) 0.5 MG tablet, TAKE 1 TABLET BY MOUTH TWICE A DAY AS NEEDED FOR ANXIETY, Disp: 60 tablet, Rfl: 2 .  hydrochlorothiazide (HYDRODIURIL) 25 MG tablet, TAKE 1 TABLET BY MOUTH EVERY DAY, Disp: 90 tablet .  levothyroxine (SYNTHROID, LEVOTHROID) 112 MCG tablet, TAKE 1 TABLET (112 MCG TOTAL) BY MOUTH DAILY., Disp: 90 tablet, Rfl: 3 .  Multiple Vitamins-Minerals (HAIR SKIN AND NAILS FORMULA PO), Take by mouth., Disp: , Rfl:  .  amphetamine-dextroamphetamine (ADDERALL) 10 MG tablet, Take 1 tablet (10 mg total) by mouth 3 (three) times daily., Disp: 90 tablet, Rfl: 0  Allergies  Allergen Reactions  . Penicillins Rash and Shortness Of Breath  . Topiramate Anxiety   Review of Systems:   Pertinent items are noted in the HPI. Otherwise, ROS is negative.  Vitals:   Vitals:   02/20/18 1447  BP: 110/68  Pulse: 90  Temp: 98.6 F (37 C)  TempSrc: Oral  SpO2: 98%  Weight: 239 lb (108.4 kg)    Height: 5\' 9"  (1.753 m)     Body mass index is 35.29 kg/m.  Physical Exam:   Physical Exam  Constitutional: She appears well-nourished.  HENT:  Head: Normocephalic and atraumatic.  Eyes: Pupils are equal, round, and reactive to light. EOM are normal.  Neck: Normal range of motion. Neck supple.  Cardiovascular: Normal rate, regular rhythm, normal heart sounds and intact distal pulses.  Pulmonary/Chest: Effort normal.  Abdominal: Soft.  Musculoskeletal:  Multiple tender spots - TL spine and facets, bilateral SI joints, bilateral piriformis, bilateral paraspinal mm.  Skin: Skin is warm.  Psychiatric: She has a normal mood and affect. Her behavior is normal.  Nursing note and vitals reviewed.   Assessment and Plan:   Cassidy Hopkins was seen today for back pain.  Diagnoses and all orders for this visit:  Acute bilateral low back pain with bilateral sciatica -     DG Lumbar Spine 2-3 Views; Future -     POCT urine pregnancy  Fibromyalgia muscle pain Comments: Exam most c/w fibromyalgia. Xray without acute or concerning findings. Patient states that she has been diagnosed with this before. Pt warned that strong pain medication has side effects.  Watch for sedation and any alteration in mentation. Pt to stop medication if having inappropriate side effects. Also, all pain medications can have habit forming qualities: patient is aware of this information.  Discussed the importance of sleep, exercise, stress reduction. Will see patient in one week. Orders: -     meloxicam (MOBIC) 15 MG tablet; Take 1 tablet (15 mg total) by mouth daily. -     baclofen (LIORESAL) 20 MG tablet; Take 1 tablet (20 mg total) by mouth 3 (three) times daily. -     HYDROcodone-acetaminophen (NORCO/VICODIN) 5-325 MG tablet; Take 1 tablet by mouth every 6 (six) hours as needed for moderate pain.  . Reviewed expectations re: course of current medical issues. . Discussed self-management of symptoms. . Outlined signs  and symptoms indicating need for more acute intervention. . Patient verbalized understanding and all questions were answered. Marland Kitchen Health Maintenance issues including appropriate healthy diet, exercise, and smoking avoidance were discussed with patient. . See orders for this visit as documented in the electronic medical record. . Patient received an After Visit Summary.  CMA served as Education administrator during this visit. History, Physical, and Plan performed by medical provider. The above documentation has been reviewed and is accurate and complete. Briscoe Deutscher, D.O.  Briscoe Deutscher, DO [place] 02/21/2018

## 2018-02-21 ENCOUNTER — Encounter: Payer: Self-pay | Admitting: Family Medicine

## 2018-02-26 DIAGNOSIS — F4312 Post-traumatic stress disorder, chronic: Secondary | ICD-10-CM | POA: Diagnosis not present

## 2018-03-01 ENCOUNTER — Other Ambulatory Visit: Payer: Self-pay | Admitting: Family Medicine

## 2018-03-03 NOTE — Progress Notes (Signed)
Cassidy Hopkins is a 42 y.o. female is here for follow up.  History of Present Illness:   HPI: Since the last visit has the patient had any:  Appetite changes? No Unintentional weight loss? No Is medication working well ? Yes Does patient take drug holidays? No Difficulties falling to sleep or maintaining sleep? No Any anxiety?  No Any cardiac issues (fainting or paliptations)? No Suicidal thoughts? No Changes in health since last visit? No New medications? No Any illicit substance abuse? No Has the patient taken his medication today? Yes  Fibromyalgia flare improved.  There are no preventive care reminders to display for this patient. Depression screen [facility] 2/9 12/03/2017 01/23/2017  Decreased Interest 0 1  Down, Depressed, Hopeless 1 2  PHQ - 2 Score 1 3  Altered sleeping 0 0  Tired, decreased energy 1 1  Change in appetite 1 1  Feeling bad or failure about yourself  1 3  Trouble concentrating 1 3  Moving slowly or fidgety/restless 0 1  Suicidal thoughts 0 0  PHQ-9 Score 5 12  Difficult doing work/chores Not difficult at all Somewhat difficult   PMHx, SurgHx, SocialHx, FamHx, Medications, and Allergies were reviewed in the Visit Navigator and updated as appropriate.   Patient Active Problem List   Diagnosis Date Noted  . Adult ADHD 12/03/2017  . Social anxiety disorder 12/03/2017  . Multiple lipomas 07/07/2016  . Hx of tubal ligation 07/06/2016  . Generalized anxiety disorder 03/31/2016  . Hypothyroidism 01/17/2016  . Pseudotumor cerebri 01/17/2016  . Obesity (BMI 30-39.9) 01/17/2016   Social History   Tobacco Use  . Smoking status: Never Smoker  . Smokeless tobacco: Never Used  Substance Use Topics  . Alcohol use: No  . Drug use: No   Current Medications and Allergies:   .  acetaZOLAMIDE (DIAMOX) 250 MG tablet, TAKE 1 TABLET BY MOUTH EVERY DAY, Disp: 30 tablet, Rfl: 1 .  amphetamine-dextroamphetamine (ADDERALL) 10 MG tablet, Take 1 tablet (10 mg total) by  mouth 3 (three) times daily., Disp: 90 tablet, Rfl: 0 .  baclofen (LIORESAL) 20 MG tablet, Take 1 tablet (20 mg total) by mouth 3 (three) times daily., Disp: 30 each, Rfl: 0 TAKING PRN FIBRO FLARE, ONLY Q HS .  cetirizine (ZYRTEC) 10 MG tablet, Take by mouth., Disp: , Rfl:  .  clonazePAM (KLONOPIN) 0.5 MG tablet, TAKE 1 TABLET BY MOUTH TWICE A DAY AS NEEDED FOR ANXIETY, Disp: 60 tablet, Rfl: 2 RARELY TAKES .  hydrochlorothiazide (HYDRODIURIL) 25 MG tablet, TAKE 1 TABLET BY MOUTH EVERY DAY, Disp: 90 tablet, Rfl: 1 .  HYDROcodone-acetaminophen (NORCO/VICODIN) 5-325 MG tablet, Take 1 tablet by mouth every 6 (six) hours as needed for moderate pain., Disp: 21 tablet, Rfl: 0 RARELY TAKES, RX BY NEPHROLOGY .  levothyroxine (SYNTHROID, LEVOTHROID) 112 MCG tablet, TAKE 1 TABLET (112 MCG TOTAL) BY MOUTH DAILY., Disp: 90 tablet, Rfl: 3 .  Multiple Vitamins-Minerals (HAIR SKIN AND NAILS FORMULA PO), Take by mouth., Disp: , Rfl:    Allergies  Allergen Reactions  . Penicillins Rash and Shortness Of Breath  . Topiramate Anxiety   Review of Systems   Pertinent items are noted in the HPI. Otherwise, ROS is negative.  Vitals:   Vitals:   03/04/18 0847  BP: 128/82  Pulse: 94  Temp: 98.1 F (36.7 C)  TempSrc: Oral  SpO2: 100%  Weight: 238 lb 12.8 oz (108.3 kg)  Height: 5\' 9"  (1.753 m)     Body mass index is 35.26  kg/m.  Physical Exam:   Physical Exam  Constitutional: She appears well-nourished.  HENT:  Head: Normocephalic and atraumatic.  Eyes: Pupils are equal, round, and reactive to light. EOM are normal.  Neck: Normal range of motion. Neck supple.  Cardiovascular: Normal rate, regular rhythm, normal heart sounds and intact distal pulses.  Pulmonary/Chest: Effort normal.  Abdominal: Soft.  Skin: Skin is warm.  Psychiatric: She has a normal mood and affect. Her behavior is normal.  Nursing note and vitals reviewed.  Assessment and Plan:   Cassidy Hopkins was seen today for medication  refill.  Diagnoses and all orders for this visit:  Adult ADHD Comments: Doing well. Orders: -     amphetamine-dextroamphetamine (ADDERALL) 10 MG tablet; Take 1 tablet (10 mg total) by mouth 3 (three) times daily. -     amphetamine-dextroamphetamine (ADDERALL) 10 MG tablet; Take 1 tablet (10 mg total) by mouth 3 (three) times daily.  Obesity (BMI 30-39.9) -     CBC with Differential/Platelet -     Comprehensive metabolic panel -     Hemoglobin A1c  Hypothyroidism, unspecified type -     TSH -     T4, free  Encounter for screening for lipid disorder -     Lipid panel  Fibromyalgia muscle pain -     venlafaxine XR (EFFEXOR XR) 75 MG 24 hr capsule; Take 1 capsule (75 mg total) by mouth daily with breakfast.  Perimenopausal -     venlafaxine XR (EFFEXOR XR) 75 MG 24 hr capsule; Take 1 capsule (75 mg total) by mouth daily with breakfast.  Generalized anxiety disorder Comments: Trial Propranolol to take over benzo. Orders: -     propranolol (INDERAL) 20 MG tablet; Take 1 tablet (20 mg total) by mouth 3 (three) times daily.   . Reviewed expectations re: course of current medical issues. . Discussed self-management of symptoms. . Outlined signs and symptoms indicating need for more acute intervention. . Patient verbalized understanding and all questions were answered. Marland Kitchen Health Maintenance issues including appropriate healthy diet, exercise, and smoking avoidance were discussed with patient. . See orders for this visit as documented in the electronic medical record. . Patient received an After Visit Summary.  Briscoe Deutscher, DO [place] 03/05/2018

## 2018-03-04 ENCOUNTER — Ambulatory Visit: Payer: Federal, State, Local not specified - PPO | Admitting: Family Medicine

## 2018-03-04 ENCOUNTER — Encounter: Payer: Self-pay | Admitting: Family Medicine

## 2018-03-04 VITALS — BP 128/82 | HR 94 | Temp 98.1°F | Ht 69.0 in | Wt 238.8 lb

## 2018-03-04 DIAGNOSIS — M797 Fibromyalgia: Secondary | ICD-10-CM

## 2018-03-04 DIAGNOSIS — F411 Generalized anxiety disorder: Secondary | ICD-10-CM

## 2018-03-04 DIAGNOSIS — F909 Attention-deficit hyperactivity disorder, unspecified type: Secondary | ICD-10-CM

## 2018-03-04 DIAGNOSIS — Z13220 Encounter for screening for lipoid disorders: Secondary | ICD-10-CM

## 2018-03-04 DIAGNOSIS — E669 Obesity, unspecified: Secondary | ICD-10-CM | POA: Diagnosis not present

## 2018-03-04 DIAGNOSIS — N951 Menopausal and female climacteric states: Secondary | ICD-10-CM

## 2018-03-04 DIAGNOSIS — E039 Hypothyroidism, unspecified: Secondary | ICD-10-CM | POA: Diagnosis not present

## 2018-03-04 LAB — LIPID PANEL
Cholesterol: 193 mg/dL (ref 0–200)
HDL: 58.7 mg/dL (ref >39.00)
LDL Cholesterol: 112 mg/dL — ABNORMAL HIGH (ref 0–99)
NonHDL: 134.04
Total CHOL/HDL Ratio: 3
Triglycerides: 108 mg/dL (ref 0.0–149.0)
VLDL: 21.6 mg/dL (ref 0.0–40.0)

## 2018-03-04 LAB — CBC WITH DIFFERENTIAL/PLATELET
Basophils Absolute: 0.1 10*3/uL (ref 0.0–0.1)
Basophils Relative: 1.1 % (ref 0.0–3.0)
Eosinophils Absolute: 0.3 10*3/uL (ref 0.0–0.7)
Eosinophils Relative: 4.2 % (ref 0.0–5.0)
HCT: 47 % — ABNORMAL HIGH (ref 36.0–46.0)
Hemoglobin: 15.9 g/dL — ABNORMAL HIGH (ref 12.0–15.0)
Lymphocytes Relative: 27.8 % (ref 12.0–46.0)
Lymphs Abs: 2 10*3/uL (ref 0.7–4.0)
MCHC: 33.8 g/dL (ref 30.0–36.0)
MCV: 89.7 fl (ref 78.0–100.0)
Monocytes Absolute: 0.4 10*3/uL (ref 0.1–1.0)
Monocytes Relative: 5.5 % (ref 3.0–12.0)
Neutro Abs: 4.4 10*3/uL (ref 1.4–7.7)
Neutrophils Relative %: 61.4 % (ref 43.0–77.0)
Platelets: 322 10*3/uL (ref 150.0–400.0)
RBC: 5.24 Mil/uL — ABNORMAL HIGH (ref 3.87–5.11)
RDW: 13.5 % (ref 11.5–15.5)
WBC: 7.2 10*3/uL (ref 4.0–10.5)

## 2018-03-04 LAB — T4, FREE: Free T4: 0.89 ng/dL (ref 0.60–1.60)

## 2018-03-04 LAB — COMPREHENSIVE METABOLIC PANEL
ALT: 14 U/L (ref 0–35)
AST: 13 U/L (ref 0–37)
Albumin: 4.2 g/dL (ref 3.5–5.2)
Alkaline Phosphatase: 116 U/L (ref 39–117)
BUN: 12 mg/dL (ref 6–23)
CO2: 30 mEq/L (ref 19–32)
Calcium: 9.6 mg/dL (ref 8.4–10.5)
Chloride: 99 mEq/L (ref 96–112)
Creatinine, Ser: 0.8 mg/dL (ref 0.40–1.20)
GFR: 82.63 mL/min (ref >60.00)
Glucose, Bld: 85 mg/dL (ref 70–99)
Potassium: 4.3 mEq/L (ref 3.5–5.1)
Sodium: 137 mEq/L (ref 135–145)
Total Bilirubin: 0.5 mg/dL (ref 0.2–1.2)
Total Protein: 7.3 g/dL (ref 6.0–8.3)

## 2018-03-04 LAB — TSH: TSH: 5.43 u[IU]/mL — ABNORMAL HIGH (ref 0.35–4.50)

## 2018-03-04 LAB — HEMOGLOBIN A1C: Hgb A1c MFr Bld: 5 % (ref 4.6–6.5)

## 2018-03-04 MED ORDER — VENLAFAXINE HCL ER 75 MG PO CP24: 75.0000 mg | ORAL_CAPSULE | Freq: Every day | ORAL | 0 refills | Status: DC

## 2018-03-04 MED ORDER — PROPRANOLOL HCL 20 MG PO TABS: 20.0000 mg | ORAL_TABLET | Freq: Three times a day (TID) | ORAL | 1 refills | Status: DC

## 2018-03-05 ENCOUNTER — Encounter: Payer: Self-pay | Admitting: Family Medicine

## 2018-03-05 DIAGNOSIS — F4312 Post-traumatic stress disorder, chronic: Secondary | ICD-10-CM | POA: Diagnosis not present

## 2018-03-05 MED ORDER — AMPHETAMINE-DEXTROAMPHETAMINE 10 MG PO TABS: 10.0000 mg | ORAL_TABLET | Freq: Three times a day (TID) | ORAL | 0 refills | Status: DC

## 2018-03-06 ENCOUNTER — Other Ambulatory Visit: Payer: Self-pay | Admitting: *Deleted

## 2018-03-06 MED ORDER — LEVOTHYROXINE SODIUM 125 MCG PO TABS: 125.0000 ug | ORAL_TABLET | Freq: Every day | ORAL | 0 refills | Status: DC

## 2018-03-19 DIAGNOSIS — F4312 Post-traumatic stress disorder, chronic: Secondary | ICD-10-CM | POA: Diagnosis not present

## 2018-03-26 ENCOUNTER — Other Ambulatory Visit: Payer: Self-pay | Admitting: Family Medicine

## 2018-03-26 DIAGNOSIS — F4312 Post-traumatic stress disorder, chronic: Secondary | ICD-10-CM | POA: Diagnosis not present

## 2018-03-26 NOTE — Telephone Encounter (Signed)
Ok to fill 

## 2018-03-27 ENCOUNTER — Encounter: Payer: Self-pay | Admitting: Family Medicine

## 2018-03-27 DIAGNOSIS — F411 Generalized anxiety disorder: Secondary | ICD-10-CM

## 2018-03-29 ENCOUNTER — Encounter: Payer: Self-pay | Admitting: Family Medicine

## 2018-03-29 ENCOUNTER — Other Ambulatory Visit: Payer: Self-pay | Admitting: Family Medicine

## 2018-03-29 DIAGNOSIS — N951 Menopausal and female climacteric states: Secondary | ICD-10-CM

## 2018-03-29 DIAGNOSIS — M797 Fibromyalgia: Secondary | ICD-10-CM

## 2018-03-29 MED ORDER — CLONAZEPAM 0.5 MG PO TABS: ORAL_TABLET | ORAL | 2 refills | Status: DC

## 2018-03-30 ENCOUNTER — Other Ambulatory Visit: Payer: Self-pay | Admitting: Family Medicine

## 2018-03-30 DIAGNOSIS — F411 Generalized anxiety disorder: Secondary | ICD-10-CM

## 2018-04-01 MED ORDER — ACETAZOLAMIDE 250 MG PO TABS: 250.0000 mg | ORAL_TABLET | Freq: Every day | ORAL | 1 refills | Status: DC

## 2018-04-01 NOTE — Addendum Note (Signed)
Addended by: Marian Sorrow on: 04/01/2018 08:50 AM   Modules accepted: Orders

## 2018-04-03 ENCOUNTER — Other Ambulatory Visit: Payer: Self-pay | Admitting: Family Medicine

## 2018-04-03 DIAGNOSIS — M797 Fibromyalgia: Secondary | ICD-10-CM

## 2018-04-04 ENCOUNTER — Other Ambulatory Visit: Payer: Self-pay | Admitting: Family Medicine

## 2018-04-04 DIAGNOSIS — M797 Fibromyalgia: Secondary | ICD-10-CM

## 2018-04-15 ENCOUNTER — Ambulatory Visit: Payer: Federal, State, Local not specified - PPO | Admitting: Family Medicine

## 2018-04-23 ENCOUNTER — Other Ambulatory Visit: Payer: Self-pay | Admitting: Family Medicine

## 2018-04-23 DIAGNOSIS — F4312 Post-traumatic stress disorder, chronic: Secondary | ICD-10-CM | POA: Diagnosis not present

## 2018-04-24 MED ORDER — AMPHETAMINE-DEXTROAMPHETAMINE 10 MG PO TABS
10.0000 mg | ORAL_TABLET | Freq: Three times a day (TID) | ORAL | 0 refills | Status: DC
Start: 2018-04-24 — End: 2018-06-20

## 2018-04-24 NOTE — Telephone Encounter (Signed)
Last app 03/04/2018 F/u 06/10/17 Last script 03/05/2018

## 2018-04-25 ENCOUNTER — Other Ambulatory Visit: Payer: Self-pay | Admitting: Family Medicine

## 2018-04-25 DIAGNOSIS — N951 Menopausal and female climacteric states: Secondary | ICD-10-CM

## 2018-04-25 DIAGNOSIS — M797 Fibromyalgia: Secondary | ICD-10-CM

## 2018-04-29 ENCOUNTER — Telehealth: Payer: Self-pay | Admitting: Family Medicine

## 2018-04-29 NOTE — Telephone Encounter (Signed)
Script has been received by pharmacy she needs to get auth from insurance.

## 2018-04-29 NOTE — Telephone Encounter (Signed)
See note

## 2018-04-29 NOTE — Telephone Encounter (Signed)
Copied from [place] [id number]. Topic: Quick Communication - Rx Refill/Question >> Apr 29, 2018  8:13 AM Rayann Heman wrote: Medication: amphetamine-dextroamphetamine (ADDERALL) 10 MG tablet [[id number]] pt states that she needs a PA. Please advise

## 2018-04-29 NOTE — Telephone Encounter (Signed)
[facility] [place]: [id number] - PA Case ID: [id number] - Rx #: [id number]  Need help? Call us at [phone]    Outcome  Approvedtoday Your PA request has been approved. Additional information will be provided in the approval communication. (Message 1145)   Drug Amphetamine-Dextroamphetamine 10MG  tablets Form FEP Electronic PA Form (NCPDP)   Original Claim Info Chili MD CALL [address]  My chart message sent to patient to let her know.

## 2018-05-03 ENCOUNTER — Ambulatory Visit: Payer: Federal, State, Local not specified - PPO | Admitting: Family Medicine

## 2018-05-09 ENCOUNTER — Other Ambulatory Visit: Payer: Self-pay | Admitting: Family Medicine

## 2018-05-21 ENCOUNTER — Other Ambulatory Visit: Payer: Self-pay | Admitting: Family Medicine

## 2018-05-21 DIAGNOSIS — F909 Attention-deficit hyperactivity disorder, unspecified type: Secondary | ICD-10-CM

## 2018-05-23 MED ORDER — AMPHETAMINE-DEXTROAMPHETAMINE 10 MG PO TABS: 10.0000 mg | ORAL_TABLET | Freq: Three times a day (TID) | ORAL | 0 refills | Status: DC

## 2018-05-23 NOTE — Telephone Encounter (Signed)
Last OV 03/04/18 Last refill 04/24/18 Next OV 06/14/18  Forwarding to Dr. Juleen China.

## 2018-05-28 DIAGNOSIS — F4312 Post-traumatic stress disorder, chronic: Secondary | ICD-10-CM | POA: Diagnosis not present

## 2018-06-04 DIAGNOSIS — F4312 Post-traumatic stress disorder, chronic: Secondary | ICD-10-CM | POA: Diagnosis not present

## 2018-06-10 ENCOUNTER — Ambulatory Visit: Payer: Federal, State, Local not specified - PPO | Admitting: Family Medicine

## 2018-06-13 NOTE — Progress Notes (Signed)
Cassidy Hopkins is a 43 y.o. female is here for follow up.  History of Present Illness:   Cassidy Hopkins, Cassidy Hopkins acting as scribe for Dr. Briscoe Deutscher.   HPI:   Adult ADHD Patient currently on Adderall 10mg  three times a day. Since the last visit has the patient had any:  Appetite changes? No Unintentional weight loss? No Is medication working well ? Yes Does patient take drug holidays? No Difficulties falling to sleep or maintaining sleep? No Any anxiety?  Yes due to situation with mother. She is taking medications that help Any cardiac issues (fainting or paliptations)? No Suicidal thoughts? No Changes in health since last visit? No New medications? No Any illicit substance abuse? No Has the patient taken his medication today? No Obesity (BMI 30-39.9)  Hypothyroidism, unspecified type Iincrease Levothyroxine to 125 mcg. She has been working on taking as directed and not eating for 45 min after.   Current symptoms: none. Patient denies change in energy level and hair loss. Symptoms have stabilized.  Lab Results  Component Value Date   TSH 1.65 06/14/2018   TSH 5.43 (H) 03/04/2018   TSH 2.17 09/03/2017   Lab Results  Component Value Date   FREET4 0.98 06/14/2018   FREET4 0.89 03/04/2018   FREET4 0.95 09/03/2017     Fibromyalgia muscle pain Patient has been taking Effexor has noticed decrease in pain.   Generalized anxiety disorder She did not like the way the Propranolol was making her feel. She has stopped taking. She has been taking the Klonopin daily and that has helped. She has had increased anxiety due to mother's illness.  Health Maintenance Due  Topic Date Due  . PAP SMEAR-Modifier  06/09/2018   Depression screen [facility] 2/9 06/14/2018 12/03/2017 01/23/2017  Decreased Interest 1 0 1  Down, Depressed, Hopeless 1 1 2   PHQ - 2 Score 2 1 3   Altered sleeping 1 0 0  Tired, decreased energy 1 1 1   Change in appetite 1 1 1   Feeling bad or failure about yourself  1  1 3   Trouble concentrating 1 1 3   Moving slowly or fidgety/restless 0 0 1  Suicidal thoughts 0 0 0  PHQ-9 Score 7 5 12   Difficult doing work/chores Somewhat difficult Not difficult at all Somewhat difficult   PMHx, SurgHx, SocialHx, FamHx, Medications, and Allergies were reviewed in the Visit Navigator and updated as appropriate.   Patient Active Problem List   Diagnosis Date Noted  . Adult ADHD 12/03/2017  . Social anxiety disorder 12/03/2017  . Multiple lipomas 07/07/2016  . Hx of tubal ligation 07/06/2016  . Generalized anxiety disorder 03/31/2016  . Hypothyroidism 01/17/2016  . Pseudotumor cerebri 01/17/2016  . Obesity (BMI 30-39.9) 01/17/2016   Social History   Tobacco Use  . Smoking status: Never Smoker  . Smokeless tobacco: Never Used  Substance Use Topics  . Alcohol use: No  . Drug use: No   Current Medications and Allergies:   .  acetaZOLAMIDE (DIAMOX) 250 MG tablet, Take 1 tablet (250 mg total) by mouth daily., Disp: 90 tablet, Rfl: 1 .  amphetamine-dextroamphetamine (ADDERALL) 10 MG tablet, Take 1 tablet (10 mg total) by mouth 3 (three) times daily., Disp: 90 tablet, Rfl: 0 .  cetirizine (ZYRTEC) 10 MG tablet, Take by mouth., Disp: , Rfl:  .  clonazePAM (KLONOPIN) 0.5 MG tablet, TAKE 1 TABLET BY MOUTH TWICE A DAY AS NEEDED FOR ANXIETY, Disp: 60 tablet, Rfl: 2 .  hydrochlorothiazide (HYDRODIURIL) 25 MG tablet, TAKE  1 TABLET BY MOUTH EVERY DAY, Disp: 90 tablet, Rfl: 1 .  levothyroxine (SYNTHROID, LEVOTHROID) 125 MCG tablet, TAKE 1 TABLET BY MOUTH EVERY DAY, Disp: 90 tablet, Rfl: 0 .  meloxicam (MOBIC) 15 MG tablet, TAKE 1 TABLET BY MOUTH EVERY DAY, Disp: 90 tablet, Rfl: 1 .  Multiple Vitamins-Minerals (HAIR SKIN AND NAILS FORMULA PO), Take by mouth., Disp: , Rfl:  .  venlafaxine XR (EFFEXOR-XR) 75 MG 24 hr capsule, TAKE 1 CAPSULE (75 MG TOTAL) BY MOUTH DAILY WITH BREAKFAST., Disp: 90 capsule, Rfl: 1   Allergies  Allergen Reactions  . Penicillins Rash and Shortness  Of Breath  . Topiramate Anxiety   Review of Systems   Pertinent items are noted in the HPI. Otherwise, a complete ROS is negative.  Vitals:   Vitals:   06/14/18 0906  BP: 126/80  Pulse: 86  Temp: 98.6 F (37 C)  TempSrc: Oral  SpO2: 97%  Weight: 239 lb (108.4 kg)  Height: 5\' 9"  (1.753 m)     Body mass index is 35.29 kg/m.  Physical Exam:   Physical Exam Vitals signs and nursing note reviewed.  HENT:     Head: Normocephalic and atraumatic.  Eyes:     Pupils: Pupils are equal, round, and reactive to light.  Neck:     Musculoskeletal: Normal range of motion and neck supple.  Cardiovascular:     Rate and Rhythm: Normal rate and regular rhythm.     Heart sounds: Normal heart sounds.  Pulmonary:     Effort: Pulmonary effort is normal.  Abdominal:     Palpations: Abdomen is soft.  Skin:    General: Skin is warm.  Psychiatric:        Behavior: Behavior normal.    Assessment and Plan:   Cassidy Hopkins was seen today for follow-up.  Diagnoses and all orders for this visit:  Encounter for long-term (current) use of high-risk medication -     Pain Mgmt, Profile 8 w/Conf, U  Adult ADHD -     amphetamine-dextroamphetamine (ADDERALL) 10 MG tablet; Take 1 tablet (10 mg total) by mouth daily before breakfast for 30 days. -     amphetamine-dextroamphetamine (ADDERALL) 10 MG tablet; Take 1 tablet (10 mg total) by mouth daily before breakfast for 30 days. -     amphetamine-dextroamphetamine (ADDERALL) 10 MG tablet; Take 1 tablet (10 mg total) by mouth daily before breakfast for 30 days.  Acquired hypothyroidism -     TSH -     T4, free    . Orders and follow up as documented in [place], reviewed diet, exercise and weight control, cardiovascular risk and specific lipid/LDL goals reviewed, reviewed medications and side effects in detail.  . Reviewed expectations re: course of current medical issues. . Outlined signs and symptoms indicating need for more acute  intervention. . Patient verbalized understanding and all questions were answered. . Patient received an After Visit Summary.  Cassidy Hopkins served as Education administrator during this visit. History, Physical, and Plan performed by medical provider. The above documentation has been reviewed and is accurate and complete. Briscoe Deutscher, D.O.  Briscoe Deutscher, DO [place], [facility] 06/16/2018

## 2018-06-14 ENCOUNTER — Encounter: Payer: Self-pay | Admitting: Family Medicine

## 2018-06-14 ENCOUNTER — Ambulatory Visit: Payer: Federal, State, Local not specified - PPO | Admitting: Family Medicine

## 2018-06-14 VITALS — BP 126/80 | HR 86 | Temp 98.6°F | Ht 69.0 in | Wt 239.0 lb

## 2018-06-14 DIAGNOSIS — E039 Hypothyroidism, unspecified: Secondary | ICD-10-CM | POA: Diagnosis not present

## 2018-06-14 DIAGNOSIS — F909 Attention-deficit hyperactivity disorder, unspecified type: Secondary | ICD-10-CM | POA: Diagnosis not present

## 2018-06-14 DIAGNOSIS — Z79899 Other long term (current) drug therapy: Secondary | ICD-10-CM | POA: Diagnosis not present

## 2018-06-14 LAB — T4, FREE: Free T4: 0.98 ng/dL (ref 0.60–1.60)

## 2018-06-14 LAB — TSH: TSH: 1.65 u[IU]/mL (ref 0.35–4.50)

## 2018-06-14 MED ORDER — AMPHETAMINE-DEXTROAMPHETAMINE 10 MG PO TABS: 10.0000 mg | ORAL_TABLET | Freq: Every day | ORAL | 0 refills | Status: DC

## 2018-06-16 ENCOUNTER — Encounter: Payer: Self-pay | Admitting: Family Medicine

## 2018-06-16 LAB — PAIN MGMT, PROFILE 8 W/CONF, U
6 Acetylmorphine: NEGATIVE ng/mL (ref <10)
Alcohol Metabolites: NEGATIVE ng/mL (ref <500)
Alphahydroxyalprazolam: NEGATIVE ng/mL (ref <25)
Alphahydroxymidazolam: NEGATIVE ng/mL (ref <50)
Alphahydroxytriazolam: NEGATIVE ng/mL (ref <50)
Aminoclonazepam: 46 ng/mL — ABNORMAL HIGH (ref <25)
Amphetamines: NEGATIVE ng/mL (ref <500)
Benzodiazepines: POSITIVE ng/mL — AB (ref <100)
Buprenorphine, Urine: NEGATIVE ng/mL (ref <5)
Cocaine Metabolite: NEGATIVE ng/mL (ref <150)
Creatinine: 140.1 mg/dL
Hydroxyethylflurazepam: NEGATIVE ng/mL (ref <50)
Lorazepam: NEGATIVE ng/mL (ref <50)
MDMA: NEGATIVE ng/mL (ref <500)
Marijuana Metabolite: NEGATIVE ng/mL (ref <20)
Nordiazepam: NEGATIVE ng/mL (ref <50)
Opiates: NEGATIVE ng/mL (ref <100)
Oxazepam: NEGATIVE ng/mL (ref <50)
Oxidant: NEGATIVE ug/mL (ref <200)
Oxycodone: NEGATIVE ng/mL (ref <100)
Temazepam: NEGATIVE ng/mL (ref <50)
pH: 5.84 (ref 4.5–9.0)

## 2018-06-18 DIAGNOSIS — F4312 Post-traumatic stress disorder, chronic: Secondary | ICD-10-CM | POA: Diagnosis not present

## 2018-06-19 ENCOUNTER — Other Ambulatory Visit: Payer: Self-pay | Admitting: Family Medicine

## 2018-06-19 ENCOUNTER — Encounter: Payer: Self-pay | Admitting: Family Medicine

## 2018-06-19 DIAGNOSIS — F411 Generalized anxiety disorder: Secondary | ICD-10-CM

## 2018-06-19 NOTE — Telephone Encounter (Signed)
Last app 06/14/18 F/u 09/13/18 Last script 03/29/2018 #60 2rf

## 2018-06-20 MED ORDER — AMPHETAMINE-DEXTROAMPHETAMINE 10 MG PO TABS: 10.0000 mg | ORAL_TABLET | Freq: Three times a day (TID) | ORAL | 0 refills | Status: DC

## 2018-06-24 DIAGNOSIS — F4312 Post-traumatic stress disorder, chronic: Secondary | ICD-10-CM | POA: Diagnosis not present

## 2018-06-25 DIAGNOSIS — Z111 Encounter for screening for respiratory tuberculosis: Secondary | ICD-10-CM | POA: Diagnosis not present

## 2018-06-25 DIAGNOSIS — Z23 Encounter for immunization: Secondary | ICD-10-CM | POA: Diagnosis not present

## 2018-06-27 DIAGNOSIS — Z111 Encounter for screening for respiratory tuberculosis: Secondary | ICD-10-CM | POA: Diagnosis not present

## 2018-07-01 DIAGNOSIS — F4312 Post-traumatic stress disorder, chronic: Secondary | ICD-10-CM | POA: Diagnosis not present

## 2018-07-08 DIAGNOSIS — F4312 Post-traumatic stress disorder, chronic: Secondary | ICD-10-CM | POA: Diagnosis not present

## 2018-07-15 DIAGNOSIS — F4312 Post-traumatic stress disorder, chronic: Secondary | ICD-10-CM | POA: Diagnosis not present

## 2018-07-19 ENCOUNTER — Other Ambulatory Visit: Payer: Self-pay | Admitting: Family Medicine

## 2018-07-19 NOTE — Telephone Encounter (Signed)
Last OV 06/14/2018 Last refill 01/28/2018 #90/1 Next OV 09/13/2018

## 2018-07-22 DIAGNOSIS — F4312 Post-traumatic stress disorder, chronic: Secondary | ICD-10-CM | POA: Diagnosis not present

## 2018-08-09 ENCOUNTER — Other Ambulatory Visit: Payer: Self-pay | Admitting: Family Medicine

## 2018-09-04 ENCOUNTER — Other Ambulatory Visit: Payer: Self-pay | Admitting: Family Medicine

## 2018-09-04 NOTE — Telephone Encounter (Signed)
Rx Refill

## 2018-09-06 MED ORDER — AMPHETAMINE-DEXTROAMPHETAMINE 10 MG PO TABS: 10.0000 mg | ORAL_TABLET | Freq: Three times a day (TID) | ORAL | 0 refills | Status: DC

## 2018-09-09 ENCOUNTER — Other Ambulatory Visit: Payer: Self-pay | Admitting: Family Medicine

## 2018-09-09 DIAGNOSIS — F411 Generalized anxiety disorder: Secondary | ICD-10-CM

## 2018-09-09 NOTE — Telephone Encounter (Signed)
Last OV 06/14/18  Last refill 06/19/18 #60/2 Next OV 11/05/2018  Forwarding to Dr. Juleen China

## 2018-09-13 ENCOUNTER — Encounter: Payer: Federal, State, Local not specified - PPO | Admitting: Family Medicine

## 2018-09-27 ENCOUNTER — Other Ambulatory Visit: Payer: Self-pay

## 2018-09-27 ENCOUNTER — Encounter: Payer: Self-pay | Admitting: Family Medicine

## 2018-09-27 ENCOUNTER — Ambulatory Visit (INDEPENDENT_AMBULATORY_CARE_PROVIDER_SITE_OTHER): Payer: Federal, State, Local not specified - PPO | Admitting: Family Medicine

## 2018-09-27 VITALS — Ht 69.0 in | Wt 239.0 lb

## 2018-09-27 DIAGNOSIS — R300 Dysuria: Secondary | ICD-10-CM | POA: Diagnosis not present

## 2018-09-27 MED ORDER — CIPROFLOXACIN HCL 500 MG PO TABS: 500.0000 mg | ORAL_TABLET | Freq: Two times a day (BID) | ORAL | 0 refills | Status: DC

## 2018-09-27 MED ORDER — TAMSULOSIN HCL 0.4 MG PO CAPS: 0.4000 mg | ORAL_CAPSULE | Freq: Every day | ORAL | 0 refills | Status: DC

## 2018-09-27 MED ORDER — FLUCONAZOLE 150 MG PO TABS: ORAL_TABLET | ORAL | 0 refills | Status: DC

## 2018-09-27 NOTE — Progress Notes (Signed)
Virtual Visit via Video   Due to the COVID-19 pandemic, this visit was completed with telemedicine (audio/video) technology to reduce patient and provider exposure as well as to preserve personal protective equipment.   I connected with Amazin Pincock by a video enabled telemedicine application and verified that I am speaking with the correct person using two identifiers. Location patient: Home Location provider:  [facility], Office Persons participating in the virtual visit: Akaiya Touchette, Briscoe Deutscher, DO   I discussed the limitations of evaluation and management by telemedicine and the availability of in person appointments. The patient expressed understanding and agreed to proceed.  Care Team   Patient Care Team: Briscoe Deutscher, DO as PCP - General (Family Medicine)  Subjective:   HPI: Patient has been having symptoms for about 4 days. She has been taking Azo with no improvement. She has had frequency and urgency. She had issue a few days ago where she had fever and very bad abdominal pain with vomiting for one day. She has not had issues with after that day.   Review of Systems  Constitutional: Negative for chills and fever.  HENT: Negative for hearing loss and tinnitus.   Eyes: Negative for blurred vision and double vision.  Respiratory: Negative for cough.   Cardiovascular: Negative for chest pain and palpitations.  Gastrointestinal: Negative for heartburn, nausea and vomiting.  Genitourinary: Positive for dysuria, frequency and urgency.  Musculoskeletal: Negative for myalgias and neck pain.  Skin: Negative for rash.  Neurological: Negative for dizziness and headaches.  Endo/Heme/Allergies: Negative for environmental allergies. Does not bruise/bleed easily.  Psychiatric/Behavioral: Negative for depression and suicidal ideas.     Patient Active Problem List   Diagnosis Date Noted  . Adult ADHD 12/03/2017  . Social anxiety disorder 12/03/2017  . Multiple lipomas  07/07/2016  . Hx of tubal ligation 07/06/2016  . Generalized anxiety disorder 03/31/2016  . Hypothyroidism 01/17/2016  . Pseudotumor cerebri 01/17/2016  . Obesity (BMI 30-39.9) 01/17/2016    Social History   Tobacco Use  . Smoking status: Never Smoker  . Smokeless tobacco: Never Used  Substance Use Topics  . Alcohol use: No    Current Outpatient Medications:  .  acetaZOLAMIDE (DIAMOX) 250 MG tablet, Take 1 tablet (250 mg total) by mouth daily., Disp: 90 tablet, Rfl: 1 .  amphetamine-dextroamphetamine (ADDERALL) 10 MG tablet, Take 1 tablet (10 mg total) by mouth 3 (three) times daily., Disp: 90 tablet, Rfl: 0 .  amphetamine-dextroamphetamine (ADDERALL) 10 MG tablet, Take 1 tablet (10 mg total) by mouth 3 (three) times daily., Disp: 90 tablet, Rfl: 0 .  amphetamine-dextroamphetamine (ADDERALL) 10 MG tablet, Take 1 tablet (10 mg total) by mouth 3 (three) times daily., Disp: 90 tablet, Rfl: 0 .  cetirizine (ZYRTEC) 10 MG tablet, Take by mouth., Disp: , Rfl:  .  clonazePAM (KLONOPIN) 0.5 MG tablet, TAKE 1 TABLET BY MOUTH TWICE A DAY AS NEEDED FOR ANXIETY, Disp: 60 tablet, Rfl: 2 .  hydrochlorothiazide (HYDRODIURIL) 25 MG tablet, TAKE 1 TABLET BY MOUTH EVERY DAY, Disp: 90 tablet, Rfl: 1 .  levothyroxine (SYNTHROID, LEVOTHROID) 125 MCG tablet, TAKE 1 TABLET BY MOUTH EVERY DAY, Disp: 90 tablet, Rfl: 0 .  Multiple Vitamins-Minerals (HAIR SKIN AND NAILS FORMULA PO), Take by mouth., Disp: , Rfl:  .  venlafaxine XR (EFFEXOR-XR) 75 MG 24 hr capsule, TAKE 1 CAPSULE (75 MG TOTAL) BY MOUTH DAILY WITH BREAKFAST., Disp: 90 capsule, Rfl: 1  Allergies  Allergen Reactions  . Penicillins Rash and Shortness Of Breath  .  Topiramate Anxiety    Objective:   VITALS: Per patient if applicable, see vitals. GENERAL: Alert and in no acute distress. CARDIOPULMONARY: No increased WOB. Speaking in clear sentences.  PSYCH: Pleasant and cooperative. Speech normal rate and rhythm. Affect is appropriate. Insight  and judgement are appropriate. Attention is focused, linear, and appropriate.  NEURO: Oriented as arrived to appointment on time with no prompting.   Depression screen [facility] 2/9 06/14/2018 12/03/2017 01/23/2017  Decreased Interest 1 0 1  Down, Depressed, Hopeless 1 1 2   PHQ - 2 Score 2 1 3   Altered sleeping 1 0 0  Tired, decreased energy 1 1 1   Change in appetite 1 1 1   Feeling bad or failure about yourself  1 1 3   Trouble concentrating 1 1 3   Moving slowly or fidgety/restless 0 0 1  Suicidal thoughts 0 0 0  PHQ-9 Score 7 5 12   Difficult doing work/chores Somewhat difficult Not difficult at all Somewhat difficult    Assessment and Plan:   Kalis was seen today for urinary tract infection.  Diagnoses and all orders for this visit:  Dysuria -     ciprofloxacin (CIPRO) 500 MG tablet; Take 1 tablet (500 mg total) by mouth 2 (two) times daily. -     tamsulosin (FLOMAX) 0.4 MG CAPS capsule; Take 1 capsule (0.4 mg total) by mouth daily. -     fluconazole (DIFLUCAN) 150 MG tablet; Take one tab if still having symptoms in three days take second pill.   Marland Kitchen COVID-19 Education: The signs and symptoms of COVID-19 were discussed with the patient and how to seek care for testing if needed. The importance of social distancing was discussed today. . Reviewed expectations re: course of current medical issues. . Discussed self-management of symptoms. . Outlined signs and symptoms indicating need for more acute intervention. . Patient verbalized understanding and all questions were answered. Marland Kitchen Health Maintenance issues including appropriate healthy diet, exercise, and smoking avoidance were discussed with patient. . See orders for this visit as documented in the electronic medical record.  Briscoe Deutscher, DO

## 2018-09-28 DIAGNOSIS — R1032 Left lower quadrant pain: Secondary | ICD-10-CM | POA: Diagnosis not present

## 2018-09-28 DIAGNOSIS — N201 Calculus of ureter: Secondary | ICD-10-CM | POA: Diagnosis not present

## 2018-09-28 DIAGNOSIS — N132 Hydronephrosis with renal and ureteral calculous obstruction: Secondary | ICD-10-CM | POA: Diagnosis not present

## 2018-09-28 DIAGNOSIS — R1030 Lower abdominal pain, unspecified: Secondary | ICD-10-CM | POA: Diagnosis not present

## 2018-09-28 DIAGNOSIS — I10 Essential (primary) hypertension: Secondary | ICD-10-CM | POA: Diagnosis not present

## 2018-09-29 ENCOUNTER — Encounter: Payer: Self-pay | Admitting: Family Medicine

## 2018-09-29 DIAGNOSIS — R300 Dysuria: Secondary | ICD-10-CM

## 2018-09-29 DIAGNOSIS — N200 Calculus of kidney: Secondary | ICD-10-CM

## 2018-09-30 ENCOUNTER — Telehealth: Payer: Self-pay | Admitting: Family Medicine

## 2018-09-30 ENCOUNTER — Ambulatory Visit: Payer: Self-pay | Admitting: *Deleted

## 2018-09-30 DIAGNOSIS — N202 Calculus of kidney with calculus of ureter: Secondary | ICD-10-CM | POA: Diagnosis not present

## 2018-09-30 NOTE — Telephone Encounter (Addendum)
Message from Aspyn Warnke sent at 09/30/2018 8:49 AM EDT   Summary: Pt having pain/throwing up   Pt states he was seen in ER and was diagnosed with kidney stones.She is still in pain this morning but it has eased to some degree.She has also thrown up a couple of times.Would like a call for advice and also referral to urologist ASAP         Pt called to check on a referral for a urologist. She stated that she is still having some nausea and pain. And the pain was worst this morning. She went to [facility] yesterday and was prescribe antiemetic and 3 days worth of Percocet.  She is asking for a call back regarding the referral.

## 2018-09-30 NOTE — Telephone Encounter (Signed)
Routing to flow at [facility] at [facility]. Notifying flow at [place].   Reason for Disposition . [1] Follow-up call from patient regarding patient's clinical status AND [2] information urgent  Answer Assessment - Initial Assessment Questions 1. REASON FOR CALL or QUESTION: "What is your reason for calling today?" or "How can I best help you?" or "What question do you have that I can help answer?"     Still having pain and nausea with kidney stone. Was checking on a urology referral as soon as possible. 2. [name]: Document the source of call. (e.g., laboratory, patient).     patient  Protocols used: PCP CALL - NO TRIAGE-A-AH

## 2018-09-30 NOTE — Telephone Encounter (Signed)
Referral has been placed to Urology.

## 2018-09-30 NOTE — Telephone Encounter (Signed)
[facility] Patient Name: [facility] Gender: Female DOB: [birth year]  Age: 43 Y 27 D Return Phone Number: [phone] (Primary), [phone] (Secondary) Address:  [place]  [address] Client [place] [facility] [place] Client Site [place] [facility] [place] Night Physician Briscoe Deutscher- DO Contact Type Call Who Is Calling Patient / Member / Family / Caregiver Call Type Triage / Clinical Relationship To Patient Self Return Phone Number [phone] (Primary) Chief Complaint URINATE - sudden inability to urinate Reason for Call Symptomatic / Request for [facility] states they saw DO yesterday via tele-visits for possible kidney stones. Pt. reports its worse today. Pt. states today multiple attempt urinate but nothing came out. Pt. has taken cipro and rx relax urinary tract. [place] ER Translation No Nurse Assessment Nurse: Martyn Ehrich, RN, Felicia Date/Time (Eastern Time): 09/28/2018 8:07:05 PM Confirm and document reason for call. If symptomatic, describe symptoms. ---Pt spoke with PCP yesterday and pt is very prone to UTIs - she was having pain and was vomiting 5 d ago. Intermittent pain in back and acute lower abdominal pain and was a little weak 4 d ago but was not vomiting. Fever left 3 d ago and still had some trouble urinating. DO thought she may have had kidney stones and UTI and she gave her cipro - took 2 yesterday and 2 today - and something to relax urinary tract and now she is worse bad pain in back on both sides and bad lower abdominal pain. Not even a drop of urine. No fever. Chills. now 97.3 on forehead Has the patient had close contact with a person known or suspected to have the novel coronavirus illness OR traveled / lives in area with major community spread  (including international travel) in the last 14 days from the onset of symptoms? * If Asymptomatic, screen for exposure and travel within the last 14 days. ---Not Applicable Does the patient have any new or worsening symptoms? ---Yes Will a triage be completed? ---Yes Related visit to physician within the last 2 weeks? ---Yes Does the PT have any chronic conditions? (i.e. diabetes, asthma, this includes High risk factors for pregnancy, etc.) ---No Is the patient pregnant or possibly pregnant? (Ask all females between the ages of 53-55) ---No Is this a behavioral health or substance abuse call? ---No PLEASE NOTE:  All timestamps contained within this report are represented as [place] Standard Time. CONFIDENTIALTY NOTICE: This fax transmission is intended only for the addressee.  It contains information that is legally privileged, confidential or otherwise protected from use or disclosure.  If you are not the intended recipient, you are strictly prohibited from reviewing, disclosing, copying using or disseminating any of this information or taking any action in reliance on or regarding this information.  If you have received this fax in error, please notify us immediately by telephone so that we can arrange for its return to [place]. Phone:  [phone], Toll-Free:  [phone], Fax:  [phone] Page: 2 of 2 Call Id: [id number] Nurse Assessment Guidelines Guideline Title Affirmed Question Affirmed Notes Nurse Date/Time Eilene Ghazi Time) Urinary Tract Infection on Antibiotic Follow-up Call - Female [1] Unable to urinate (or only a few drops) > 4 hours AND [2] bladder feels very full (e.g., palpable bladder or strong urge to urinate)  Martyn Ehrich, RN, Felicia 08/22/3534 1:44:31 PM Disp. Time Eilene Ghazi  Time) Disposition Final User 09/28/2018 8:16:50 PM Go to ED Now Yes Martyn Ehrich, RN, Sol[place] Ice     [name] Disagree/Comply Comply [name] Understands Yes PreDisposition Did not know what to do Care Advice Given Per  Guideline GO TO ED NOW: * You need to be seen in the Emergency Department. * Go to the ED at ___________ [place] now. Drive carefully. Referrals GO TO FACILITY UNDECIDED GO TO FACILITY OTHER - SPECIFY

## 2018-09-30 NOTE — Telephone Encounter (Signed)
See note, patient was seen 5/8

## 2018-09-30 NOTE — Telephone Encounter (Signed)
This encounter was created in error - please disregard.

## 2018-09-30 NOTE — Telephone Encounter (Signed)
Noted, see other telephone note.  

## 2018-09-30 NOTE — Telephone Encounter (Signed)
Spoke with patient and advised that urgent referral has been placed and referral coordinator is getting everything ready to send over to [facility] now. Someone from [facility] should be contacting her to schedule soon. Pt verbalized understanding.

## 2018-10-02 MED ORDER — OXYCODONE-ACETAMINOPHEN 5-325 MG PO TABS: 1.0000 | ORAL_TABLET | Freq: Three times a day (TID) | ORAL | 0 refills | Status: DC | PRN

## 2018-10-02 NOTE — Addendum Note (Signed)
Addended by: Briscoe Deutscher R on: 10/02/2018 04:21 PM   Modules accepted: Orders

## 2018-10-07 ENCOUNTER — Other Ambulatory Visit: Payer: Self-pay | Admitting: Family Medicine

## 2018-10-07 MED ORDER — AMPHETAMINE-DEXTROAMPHETAMINE 10 MG PO TABS: 10.0000 mg | ORAL_TABLET | Freq: Three times a day (TID) | ORAL | 0 refills | Status: DC

## 2018-10-07 NOTE — Telephone Encounter (Signed)
LOV: 09/27/18 Next appt: 11/05/18 PMP website checked-Last rx written 09/06/18 (not filled until 4/23)

## 2018-10-15 ENCOUNTER — Other Ambulatory Visit: Payer: Self-pay | Admitting: Family Medicine

## 2018-10-15 DIAGNOSIS — M797 Fibromyalgia: Secondary | ICD-10-CM

## 2018-10-15 DIAGNOSIS — N951 Menopausal and female climacteric states: Secondary | ICD-10-CM

## 2018-10-18 DIAGNOSIS — N201 Calculus of ureter: Secondary | ICD-10-CM | POA: Diagnosis not present

## 2018-10-18 DIAGNOSIS — R8271 Bacteriuria: Secondary | ICD-10-CM | POA: Diagnosis not present

## 2018-10-18 DIAGNOSIS — R3121 Asymptomatic microscopic hematuria: Secondary | ICD-10-CM | POA: Diagnosis not present

## 2018-10-19 ENCOUNTER — Other Ambulatory Visit: Payer: Self-pay | Admitting: Family Medicine

## 2018-10-19 DIAGNOSIS — R300 Dysuria: Secondary | ICD-10-CM

## 2018-11-03 ENCOUNTER — Other Ambulatory Visit: Payer: Self-pay | Admitting: Family Medicine

## 2018-11-04 ENCOUNTER — Other Ambulatory Visit: Payer: Self-pay | Admitting: Family Medicine

## 2018-11-04 ENCOUNTER — Encounter: Payer: Self-pay | Admitting: Physician Assistant

## 2018-11-04 DIAGNOSIS — N200 Calculus of kidney: Secondary | ICD-10-CM | POA: Insufficient documentation

## 2018-11-04 DIAGNOSIS — N201 Calculus of ureter: Secondary | ICD-10-CM

## 2018-11-04 HISTORY — DX: Calculus of ureter: N20.1

## 2018-11-05 ENCOUNTER — Other Ambulatory Visit: Payer: Self-pay | Admitting: Family Medicine

## 2018-11-05 ENCOUNTER — Encounter: Payer: Self-pay | Admitting: Family Medicine

## 2018-11-05 ENCOUNTER — Encounter: Payer: Federal, State, Local not specified - PPO | Admitting: Family Medicine

## 2018-11-05 NOTE — Telephone Encounter (Signed)
Last OV 06/14/18 Last refill 10/07/18 Next OV 12/20/18

## 2018-11-05 NOTE — Telephone Encounter (Signed)
Not sure why it was denied other than we had a few messages open. Ok to refill?

## 2018-11-06 MED ORDER — AMPHETAMINE-DEXTROAMPHETAMINE 10 MG PO TABS: 10.0000 mg | ORAL_TABLET | Freq: Three times a day (TID) | ORAL | 0 refills | Status: DC

## 2018-11-11 DIAGNOSIS — F4312 Post-traumatic stress disorder, chronic: Secondary | ICD-10-CM | POA: Diagnosis not present

## 2018-11-24 ENCOUNTER — Other Ambulatory Visit: Payer: Self-pay | Admitting: Family Medicine

## 2018-11-25 DIAGNOSIS — F4312 Post-traumatic stress disorder, chronic: Secondary | ICD-10-CM | POA: Diagnosis not present

## 2018-11-26 ENCOUNTER — Other Ambulatory Visit: Payer: Self-pay | Admitting: Family Medicine

## 2018-11-26 DIAGNOSIS — R300 Dysuria: Secondary | ICD-10-CM

## 2018-11-28 ENCOUNTER — Other Ambulatory Visit: Payer: Self-pay | Admitting: Family Medicine

## 2018-11-29 ENCOUNTER — Other Ambulatory Visit: Payer: Self-pay | Admitting: Family Medicine

## 2018-11-29 MED ORDER — AMPHETAMINE-DEXTROAMPHETAMINE 10 MG PO TABS: 10.0000 mg | ORAL_TABLET | Freq: Three times a day (TID) | ORAL | 0 refills | Status: DC

## 2018-11-29 NOTE — Telephone Encounter (Signed)
Pt requesting refill on Adderall, Last OV 06/14/2018 for ADHD, Last refill 6/17 , # 90, refill 0. Pt is scheduled for an appt 7/31 with you.

## 2018-12-01 ENCOUNTER — Other Ambulatory Visit: Payer: Self-pay | Admitting: Family Medicine

## 2018-12-01 DIAGNOSIS — F411 Generalized anxiety disorder: Secondary | ICD-10-CM

## 2018-12-02 DIAGNOSIS — F4312 Post-traumatic stress disorder, chronic: Secondary | ICD-10-CM | POA: Diagnosis not present

## 2018-12-02 NOTE — Telephone Encounter (Signed)
Requesting refill on Clonazepam 0.5 mg , Last OV 06/14/18, Last Rx 09/11/18, # 60, refills 2. Pt is scheduled for an appt on 12/20/18.

## 2018-12-03 ENCOUNTER — Other Ambulatory Visit: Payer: Self-pay | Admitting: Family Medicine

## 2018-12-03 DIAGNOSIS — R300 Dysuria: Secondary | ICD-10-CM

## 2018-12-09 DIAGNOSIS — F4312 Post-traumatic stress disorder, chronic: Secondary | ICD-10-CM | POA: Diagnosis not present

## 2018-12-20 ENCOUNTER — Encounter: Payer: Federal, State, Local not specified - PPO | Admitting: Family Medicine

## 2018-12-24 ENCOUNTER — Other Ambulatory Visit: Payer: Self-pay | Admitting: Family Medicine

## 2018-12-26 ENCOUNTER — Ambulatory Visit: Payer: Federal, State, Local not specified - PPO | Admitting: Family Medicine

## 2018-12-26 ENCOUNTER — Other Ambulatory Visit: Payer: Self-pay

## 2018-12-26 ENCOUNTER — Encounter: Payer: Self-pay | Admitting: Family Medicine

## 2018-12-26 ENCOUNTER — Emergency Department
Admission: EM | Admit: 2018-12-26 | Discharge: 2018-12-26 | Disposition: A | Discharge status: Home / Self Care | Payer: Federal, State, Local not specified - PPO | Source: Home / Self Care

## 2018-12-26 DIAGNOSIS — R0981 Nasal congestion: Secondary | ICD-10-CM

## 2018-12-26 DIAGNOSIS — R51 Headache: Secondary | ICD-10-CM

## 2018-12-26 DIAGNOSIS — R509 Fever, unspecified: Secondary | ICD-10-CM | POA: Diagnosis not present

## 2018-12-26 DIAGNOSIS — Z20822 Contact with and (suspected) exposure to covid-19: Secondary | ICD-10-CM

## 2018-12-26 DIAGNOSIS — M791 Myalgia, unspecified site: Secondary | ICD-10-CM

## 2018-12-26 DIAGNOSIS — Z20828 Contact with and (suspected) exposure to other viral communicable diseases: Secondary | ICD-10-CM

## 2018-12-26 NOTE — Discharge Instructions (Addendum)
Vitamin C 500 mg twice daily Vitamin D 5000 International units (IU) once daily Zinc 50 mg daily  Continue the ibuprofen  Quarantine until [place] test results known, usually will need 14 days of quarantine.

## 2018-12-26 NOTE — ED Provider Notes (Signed)
[name]    CSN: [id number] Arrival date & time: 12/26/18  1424     History   Chief Complaint Chief Complaint  Patient presents with  . Nasal Congestion  . Fever  . Generalized Body Aches    HPI Cassidy Hopkins is a 43 y.o. female.   Initial [facility] visit for this 43 yo woman complaining of flu-like symptoms.  Symptoms began several days ago, preceded by two children being sick for several days and she took a visit to [facility] 3 weeks ago.  Pt has nasal drainage, left lymph node swelling in left neck.Generalized body aches, just feels bad.  No loss of sense of smell.  Toes are blue, she has diarrhea, no cough, but does have DOE.  Mild sore throat with neck gland sore and enlarged.  Her sister commit suicide 4 days ago and her mother died 2 days ago.       Past Medical History:  Diagnosis Date  . Breast cancer ([place]) 1997  . Hyperlipidemia   . Hypertension   . Irregular heart rate   . Migraines   . Thyroid disease     Patient Active Problem List   Diagnosis Date Noted  . Right ureteral stone 11/04/2018  . Adult ADHD 12/03/2017  . Social anxiety disorder 12/03/2017  . Multiple lipomas 07/07/2016  . Hx of tubal ligation 07/06/2016  . Generalized anxiety disorder 03/31/2016  . Hypothyroidism 01/17/2016  . Pseudotumor cerebri 01/17/2016  . Obesity (BMI 30-39.9) 01/17/2016    Past Surgical History:  Procedure Laterality Date  . BREAST SURGERY    . CESAREAN SECTION  2009, 2014, 2016  . CHOLECYSTECTOMY  1997    OB History   No obstetric history on file.      Home Medications    Prior to Admission medications   Medication Sig Start Date End Date Taking? Authorizing Provider  acetaZOLAMIDE (DIAMOX) 250 MG tablet TAKE 1 TABLET BY MOUTH EVERY DAY 12/24/18   Briscoe Deutscher, DO  amphetamine-dextroamphetamine (ADDERALL) 10 MG tablet Take 1 tablet (10 mg total) by mouth 3 (three) times daily. 07/17/18   Briscoe Deutscher, DO  amphetamine-dextroamphetamine  (ADDERALL) 10 MG tablet Take 1 tablet (10 mg total) by mouth 3 (three) times daily. 10/07/18   Briscoe Deutscher, DO  amphetamine-dextroamphetamine (ADDERALL) 10 MG tablet Take 1 tablet (10 mg total) by mouth 3 (three) times daily. 11/29/18   Briscoe Deutscher, DO  cetirizine (ZYRTEC) 10 MG tablet Take by mouth.    [provider]  ciprofloxacin (CIPRO) 500 MG tablet Take 1 tablet (500 mg total) by mouth 2 (two) times daily. 09/27/18   Briscoe Deutscher, DO  clonazePAM (KLONOPIN) 0.5 MG tablet TAKE 1 TABLET BY MOUTH TWICE A DAY AS NEEDED FOR ANXIETY 12/02/18   Briscoe Deutscher, DO  fluconazole (DIFLUCAN) 150 MG tablet Take one tab if still having symptoms in three days take second pill. 09/27/18   Briscoe Deutscher, DO  hydrochlorothiazide (HYDRODIURIL) 25 MG tablet TAKE 1 TABLET BY MOUTH EVERY DAY 07/19/18   Briscoe Deutscher, DO  levothyroxine (SYNTHROID) 125 MCG tablet TAKE 1 TABLET BY MOUTH EVERY DAY 11/25/18   Briscoe Deutscher, DO  Multiple Vitamins-Minerals (HAIR SKIN AND NAILS FORMULA PO) Take by mouth.    [provider]  oxyCODONE-acetaminophen (PERCOCET/ROXICET) 5-325 MG tablet Take 1-2 tablets by mouth every 8 (eight) hours as needed for severe pain. 10/02/18   Briscoe Deutscher, DO  tamsulosin (FLOMAX) 0.4 MG CAPS capsule TAKE 1 CAPSULE BY MOUTH EVERY DAY 12/03/18  Briscoe Deutscher, DO  venlafaxine XR (EFFEXOR-XR) 75 MG 24 hr capsule TAKE 1 CAPSULE BY MOUTH EVERY DAY WITH BREAKFAST 10/16/18   Briscoe Deutscher, DO    Family History Family History  Problem Relation Age of Onset  . Hypertension Mother   . Hyperlipidemia Mother   . Hyperlipidemia Father   . Heart disease Father   . Hypertension Father   . Diabetes Father   . Colon cancer Maternal Grandmother   . Hyperlipidemia Maternal Grandmother     Social History Social History   Tobacco Use  . Smoking status: Never Smoker  . Smokeless tobacco: Never Used  Substance Use Topics  . Alcohol use: No  . Drug use: No     Allergies    Penicillins and Topiramate   Review of Systems Review of Systems  Constitutional: Positive for activity change, appetite change, chills, diaphoresis, fatigue and fever.  HENT: Positive for congestion, mouth sores, sinus pressure and sore throat.   Respiratory: Positive for shortness of breath. Negative for cough and chest tightness.   Cardiovascular: Negative for chest pain.  Gastrointestinal: Positive for diarrhea.  Musculoskeletal: Positive for myalgias.  Skin: Positive for color change.  Neurological: Positive for headaches.  Psychiatric/Behavioral: Positive for dysphoric mood and sleep disturbance. Negative for hallucinations and self-injury.     Physical Exam Triage Vital Signs ED Triage Vitals  Enc Vitals Group     BP      Pulse      Resp      Temp      Temp src      SpO2      Weight      Height      Head Circumference      Peak Flow      Pain Score      Pain Loc      Pain Edu?      Excl. in [place]?    No data found.  Updated Vital Signs BP (!) 159/98 (BP Location: Right Arm)   Pulse (!) 107   Temp 99 F (37.2 C) (Oral)   Resp 20   Ht 5\' 6"  (1.676 m)   Wt 108.9 kg   SpO2 98%   BMI 38.74 kg/m    Physical Exam Vitals signs and nursing note reviewed.  Constitutional:      Appearance: Normal appearance. She is not ill-appearing or toxic-appearing.  HENT:     Head: Normocephalic.     Mouth/Throat:     Comments: Red soft palate on left Eyes:     Conjunctiva/sclera: Conjunctivae normal.  Neck:     Musculoskeletal: Normal range of motion and neck supple.  Cardiovascular:     Rate and Rhythm: Regular rhythm. Tachycardia present.     Heart sounds: Normal heart sounds.  Pulmonary:     Effort: Pulmonary effort is normal.     Breath sounds: Normal breath sounds.  Musculoskeletal: Normal range of motion.  Skin:    General: Skin is warm and dry.     Comments: Toes have bluish hue and feel cool  Neurological:     General: No focal deficit present.      Mental Status: She is alert and oriented to person, place, and time.  Psychiatric:        Mood and Affect: Mood normal.        Behavior: Behavior normal.        Thought Content: Thought content normal.        Judgment: Judgment normal.  UC Treatments / Results  Labs (all labs ordered are listed, but only abnormal results are displayed) Labs Reviewed  NOVEL CORONAVIRUS, NAA    EKG   Radiology No results found.  Procedures Procedures (including critical care time)  Medications Ordered in UC Medications - No data to display  Initial Impression / Assessment and Plan / UC Course  I have reviewed the triage vital signs and the nursing notes.  Pertinent labs & imaging results that were available during my care of the patient were reviewed by me and considered in my medical decision making (see chart for details).    Final Clinical Impressions(s) / UC Diagnoses   Final diagnoses:  Nasal congestion  Fever, unspecified  Suspected Covid-19 Virus Infection     Discharge Instructions     Vitamin C 500 mg twice daily Vitamin D 5000 International units (IU) once daily Zinc 50 mg daily  Continue the ibuprofen  Quarantine until Covid-19 test results known, usually will need 14 days of quarantine.    ED Prescriptions    None     Controlled Substance Prescriptions [place] Controlled Substance Registry consulted? Not Applicable   Robyn Haber, MD 12/26/18 1536

## 2018-12-26 NOTE — ED Triage Notes (Signed)
Pt has nasal drainage, left lymph node swelling in left neck.Generalized body aches, just feels bad.

## 2018-12-28 ENCOUNTER — Other Ambulatory Visit: Payer: Self-pay | Admitting: Family Medicine

## 2018-12-28 ENCOUNTER — Telehealth: Payer: Self-pay | Admitting: Emergency Medicine

## 2018-12-28 LAB — NOVEL CORONAVIRUS, NAA: SARS-CoV-2, NAA: NOT DETECTED

## 2018-12-28 NOTE — Telephone Encounter (Signed)
Patient informed of negative COVID test.

## 2018-12-30 MED ORDER — AMPHETAMINE-DEXTROAMPHETAMINE 10 MG PO TABS: 10.0000 mg | ORAL_TABLET | Freq: Three times a day (TID) | ORAL | 0 refills | Status: DC

## 2019-01-08 ENCOUNTER — Other Ambulatory Visit: Payer: Self-pay | Admitting: Family Medicine

## 2019-01-08 NOTE — Telephone Encounter (Signed)
Last fill 07/19/18  #90/1 Last OV 085/08/20

## 2019-01-20 ENCOUNTER — Other Ambulatory Visit: Payer: Self-pay | Admitting: Family Medicine

## 2019-01-21 NOTE — Telephone Encounter (Signed)
Last fill 12/24/18  #30/0 Last OV 09/27/18 Okay to refill?

## 2019-01-24 ENCOUNTER — Other Ambulatory Visit: Payer: Self-pay | Admitting: Family Medicine

## 2019-01-24 NOTE — Telephone Encounter (Signed)
Last med check 06/14/18 Acute visit 09/27/18 Last refill 12/30/18 #90/0 Next OV not scheduled  Forwarding to Dr. Juleen China

## 2019-01-25 ENCOUNTER — Other Ambulatory Visit: Payer: Self-pay | Admitting: Family Medicine

## 2019-01-27 MED ORDER — AMPHETAMINE-DEXTROAMPHETAMINE 10 MG PO TABS: 10.0000 mg | ORAL_TABLET | Freq: Three times a day (TID) | ORAL | 0 refills | Status: DC

## 2019-01-28 ENCOUNTER — Telehealth: Payer: Self-pay | Admitting: Family Medicine

## 2019-01-28 NOTE — Telephone Encounter (Signed)
[facility] [place] RECORD [facility] Patient Name: Cassidy Hopkins - [place] Gender: Female DOB: [birth year] Age: 43 Y 60 M 24 D Return Phone Number: [phone] (Primary), [phone] (Secondary) Address: City/State/Zip: [place] [address] Client [place] [facility] [place] Client Site [place] [facility] [place] Night Physician Briscoe Deutscher- DO Contact Type Call Who Is Calling Patient / Member / Family / Caregiver Call Type Triage / Clinical Relationship To Patient Self Return Phone Number [phone] (Primary) Chief Complaint Prescription Refill or Medication Request (non symptomatic) Reason for Call Medication Question / Request Initial Comment [name] states that she needs to get her prescription refilled before she leaves. States that she did send in a request but it has not been filled. [name] states that she does not have enough medication to last until Tuesday. [name] states that she is not having any symptoms. [name] states that she has not attempted to get a loaner dose. States that she would like to try to see if it can be filled. She just does not know the side effects of not having the medication. [place] that [place] and will not be here to get the medicaiton. Translation No Nurse Assessment Nurse: Tressia Danas, RN, Holly Date/Time (Eastern Time): 01/26/2019 2:25:49 PM Confirm and document reason for call. If symptomatic, describe symptoms. ---[name] states that she needs to get her prescription refilled before she leaves. States that she did send in a request but it has not been filled. [name] states that she does not have enough medication to last until Tuesday. [name] states that she is not having any symptoms. [name] states that she has not attempted to get a loaner dose. States that she would like to try to see if it can be filled. She just does  not know the side effects of not having the medication. States that [place] do her sister's and mother's burial and will not be here to get the medication. [name] states she will try and change her flight to accommodate when her medication can be refilled. or she will go without her medication. she will be gone a week to 10 days. Medication is Adderall Has the patient had close contact with a person known or suspected to have the novel coronavirus illness OR traveled / lives in area with major community spread (including international travel) in the last 14 days from the onset of symptoms? * If Asymptomatic, screen for exposure and travel within the last 14 days. ---No Does the patient have any new or worsening symptoms? ---No PLEASE NOTE: All timestamps contained within this report are represented as [place] Standard Time. CONFIDENTIALTY NOTICE: This fax transmission is intended only for the addressee. It contains information that is legally privileged, confidential or otherwise protected from use or disclosure. If you are not the intended recipient, you are strictly prohibited from reviewing, disclosing, copying using or disseminating any of this information or taking any action in reliance on or regarding this information. If you have received this fax in error, please notify us immediately by telephone so that we can arrange for its return to [place]. Phone: [phone], Toll-Free: [phone], Fax: [phone] Page: 2 of 2 Call Id: [id number] Nurse Assessment Nurse: Tressia Danas RN, Earnest Bailey Date/Time ([place] Time): 01/26/2019 2:30:39 PM Please select the assessment type ---Request for controlled medication refill Additional Documentation ---caller requesting an early refill on her Adderall to attend 2 family  funerals out of town Is there an on-call physician for the client? ---Yes Do the client directives specifically allow for paging the on-call regarding scheduled drugs?  ---No Guidelines Guideline Title Affirmed Question Affirmed Notes Nurse Date/Time ([place] Time) Disp. Time Eilene Ghazi Time) Disposition Final User 01/26/2019 1:53:31 PM Send To RN Personal Myles Gip, RN, Haynes Dage 01/26/2019 2:31:29 PM Clinical Call Yes Tressia Danas, RN, Earnest Bailey Comments User: Dennard Nip, RN Date/Time Eilene Ghazi Time): 01/26/2019 2:32:14 PM explained that refills are not done after hours and she will need to call the office on Tuesday. caller states understanding User: Dennard Nip, RN Date/Time Eilene Ghazi Time): 01/26/2019 2:33:10 PM informed since medication is a controlled medication I could not help her with a refill early

## 2019-01-28 NOTE — Telephone Encounter (Signed)
Called patient has been addressed and handled for patient.

## 2019-01-29 DIAGNOSIS — F4312 Post-traumatic stress disorder, chronic: Secondary | ICD-10-CM | POA: Diagnosis not present

## 2019-02-08 DIAGNOSIS — F4312 Post-traumatic stress disorder, chronic: Secondary | ICD-10-CM | POA: Diagnosis not present
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
## 2019-02-10 ENCOUNTER — Other Ambulatory Visit: Payer: Self-pay

## 2019-02-10 ENCOUNTER — Encounter: Payer: Self-pay | Admitting: Family Medicine

## 2019-02-10 ENCOUNTER — Ambulatory Visit (INDEPENDENT_AMBULATORY_CARE_PROVIDER_SITE_OTHER): Payer: Federal, State, Local not specified - PPO

## 2019-02-10 ENCOUNTER — Other Ambulatory Visit (HOSPITAL_COMMUNITY)
Admission: RE | Admit: 2019-02-10 | Discharge: 2019-02-10 | Disposition: A | Discharge status: Home / Self Care | Payer: Federal, State, Local not specified - PPO | Source: Ambulatory Visit | Attending: Family Medicine | Admitting: Family Medicine

## 2019-02-10 ENCOUNTER — Ambulatory Visit: Payer: Federal, State, Local not specified - PPO | Admitting: Family Medicine

## 2019-02-10 VITALS — BP 110/80 | HR 84 | Temp 97.7°F | Ht 66.0 in | Wt 244.4 lb

## 2019-02-10 DIAGNOSIS — E7800 Pure hypercholesterolemia, unspecified: Secondary | ICD-10-CM

## 2019-02-10 DIAGNOSIS — N201 Calculus of ureter: Secondary | ICD-10-CM

## 2019-02-10 DIAGNOSIS — Z124 Encounter for screening for malignant neoplasm of cervix: Secondary | ICD-10-CM | POA: Insufficient documentation

## 2019-02-10 DIAGNOSIS — F909 Attention-deficit hyperactivity disorder, unspecified type: Secondary | ICD-10-CM

## 2019-02-10 DIAGNOSIS — Z0000 Encounter for general adult medical examination without abnormal findings: Secondary | ICD-10-CM | POA: Diagnosis not present

## 2019-02-10 DIAGNOSIS — G932 Benign intracranial hypertension: Secondary | ICD-10-CM

## 2019-02-10 DIAGNOSIS — F5101 Primary insomnia: Secondary | ICD-10-CM

## 2019-02-10 DIAGNOSIS — Z79899 Other long term (current) drug therapy: Secondary | ICD-10-CM

## 2019-02-10 DIAGNOSIS — E039 Hypothyroidism, unspecified: Secondary | ICD-10-CM | POA: Diagnosis not present

## 2019-02-10 DIAGNOSIS — F4310 Post-traumatic stress disorder, unspecified: Secondary | ICD-10-CM

## 2019-02-10 DIAGNOSIS — E669 Obesity, unspecified: Secondary | ICD-10-CM

## 2019-02-10 DIAGNOSIS — Z13220 Encounter for screening for lipoid disorders: Secondary | ICD-10-CM | POA: Diagnosis not present

## 2019-02-10 DIAGNOSIS — N200 Calculus of kidney: Secondary | ICD-10-CM | POA: Diagnosis not present

## 2019-02-10 DIAGNOSIS — F411 Generalized anxiety disorder: Secondary | ICD-10-CM

## 2019-02-10 LAB — COMPREHENSIVE METABOLIC PANEL
ALT: 14 U/L (ref 0–35)
AST: 18 U/L (ref 0–37)
Albumin: 3.8 g/dL (ref 3.5–5.2)
Alkaline Phosphatase: 120 U/L — ABNORMAL HIGH (ref 39–117)
BUN: 13 mg/dL (ref 6–23)
CO2: 27 mEq/L (ref 19–32)
Calcium: 9.3 mg/dL (ref 8.4–10.5)
Chloride: 102 mEq/L (ref 96–112)
Creatinine, Ser: 0.69 mg/dL (ref 0.40–1.20)
GFR: 91.82 mL/min (ref >60.00)
Glucose, Bld: 88 mg/dL (ref 70–99)
Potassium: 3.5 mEq/L (ref 3.5–5.1)
Sodium: 138 mEq/L (ref 135–145)
Total Bilirubin: 0.4 mg/dL (ref 0.2–1.2)
Total Protein: 6.8 g/dL (ref 6.0–8.3)

## 2019-02-10 LAB — CBC WITH DIFFERENTIAL/PLATELET
Basophils Absolute: 0 10*3/uL (ref 0.0–0.1)
Basophils Relative: 0.7 % (ref 0.0–3.0)
Eosinophils Absolute: 0.2 10*3/uL (ref 0.0–0.7)
Eosinophils Relative: 4.2 % (ref 0.0–5.0)
HCT: 44.2 % (ref 36.0–46.0)
Hemoglobin: 15.2 g/dL — ABNORMAL HIGH (ref 12.0–15.0)
Lymphocytes Relative: 33.5 % (ref 12.0–46.0)
Lymphs Abs: 1.9 10*3/uL (ref 0.7–4.0)
MCHC: 34.4 g/dL (ref 30.0–36.0)
MCV: 89.2 fl (ref 78.0–100.0)
Monocytes Absolute: 0.3 10*3/uL (ref 0.1–1.0)
Monocytes Relative: 5.6 % (ref 3.0–12.0)
Neutro Abs: 3.3 10*3/uL (ref 1.4–7.7)
Neutrophils Relative %: 56 % (ref 43.0–77.0)
Platelets: 305 10*3/uL (ref 150.0–400.0)
RBC: 4.95 Mil/uL (ref 3.87–5.11)
RDW: 12.6 % (ref 11.5–15.5)
WBC: 5.8 10*3/uL (ref 4.0–10.5)

## 2019-02-10 LAB — LIPID PANEL
Cholesterol: 224 mg/dL — ABNORMAL HIGH (ref 0–200)
HDL: 46.8 mg/dL (ref >39.00)
NonHDL: 177.24
Total CHOL/HDL Ratio: 5
Triglycerides: 233 mg/dL — ABNORMAL HIGH (ref 0.0–149.0)
VLDL: 46.6 mg/dL — ABNORMAL HIGH (ref 0.0–40.0)

## 2019-02-10 LAB — TSH: TSH: 0.26 u[IU]/mL — ABNORMAL LOW (ref 0.35–4.50)

## 2019-02-10 LAB — T4, FREE: Free T4: 1.02 ng/dL (ref 0.60–1.60)

## 2019-02-10 LAB — LDL CHOLESTEROL, DIRECT: Direct LDL: 143 mg/dL

## 2019-02-10 LAB — MAGNESIUM: Magnesium: 2.1 mg/dL (ref 1.5–2.5)

## 2019-02-10 MED ORDER — TRAZODONE HCL 100 MG PO TABS: 100.0000 mg | ORAL_TABLET | Freq: Every evening | ORAL | 2 refills | Status: DC | PRN

## 2019-02-10 NOTE — Addendum Note (Signed)
Addended by: Francella Solian on: 02/10/2019 02:02 PM   Modules accepted: Orders

## 2019-02-13 ENCOUNTER — Other Ambulatory Visit: Payer: Self-pay | Admitting: Family Medicine

## 2019-02-14 ENCOUNTER — Other Ambulatory Visit: Payer: Self-pay | Admitting: Family Medicine

## 2019-02-14 ENCOUNTER — Telehealth: Payer: Self-pay | Admitting: Physical Therapy

## 2019-02-14 MED ORDER — LEVOTHYROXINE SODIUM 112 MCG PO TABS: 112.0000 ug | ORAL_TABLET | Freq: Every day | ORAL | 3 refills | Status: DC

## 2019-02-14 NOTE — Telephone Encounter (Signed)
Patient also has questions about amphetamine-dextroamphetamine (ADDERALL) 10 MG tablet. She inquired if this has been discontinued.

## 2019-02-14 NOTE — Addendum Note (Signed)
Addended by: Briscoe Deutscher R on: 02/14/2019 12:19 PM   Modules accepted: Orders

## 2019-02-14 NOTE — Telephone Encounter (Signed)
Please advise 

## 2019-02-14 NOTE — Telephone Encounter (Signed)
Copied from [place] [id number]. Topic: General - Other >> Feb 14, 2019  1:56 PM Leward Quan A wrote: Reason for CRM: Patient called to inquire about the reason that her medication acetaZOLAMIDE (DIAMOX) 250 MG tablet she states that she can not take that traZODone (DESYREL) 100 MG tablet because it is giving her terrible headaches and she states she will not take it anymore. Please advise  She ask for a call back at Ph# [phone]

## 2019-02-15 ENCOUNTER — Other Ambulatory Visit: Payer: Self-pay | Admitting: Family Medicine

## 2019-02-16 NOTE — Telephone Encounter (Signed)
Please clarify with patient med list. I didn't DC anything. Tee up refills.

## 2019-02-17 ENCOUNTER — Other Ambulatory Visit: Payer: Self-pay

## 2019-02-17 MED ORDER — ACETAZOLAMIDE 250 MG PO TABS: 250.0000 mg | ORAL_TABLET | Freq: Every day | ORAL | 3 refills | Status: DC

## 2019-02-17 MED ORDER — AMPHETAMINE-DEXTROAMPHETAMINE 10 MG PO TABS: 10.0000 mg | ORAL_TABLET | Freq: Three times a day (TID) | ORAL | 0 refills | Status: DC

## 2019-02-17 NOTE — Telephone Encounter (Signed)
Please advise. Pt called to inform Dr. Juleen China that she stopped taking the Trazodone 2 days ago.  Pt explained that she was still having nightmares and also it was not helping her sleep.  Pt is asking for suggestions of a different medication that can help with her PTSD.  Patient also would like a refill of medication that has been discontinued, Acetazolamide.

## 2019-02-17 NOTE — Progress Notes (Signed)
error 

## 2019-02-17 NOTE — Telephone Encounter (Signed)
Medication teed up and sent to Dr. Juleen China to advise.

## 2019-02-17 NOTE — Telephone Encounter (Signed)
Last OV 02/10/19 Last refill 07/17/18 #90/0 Next OV not scheduled   Forwarding to Dr. Juleen China

## 2019-02-18 ENCOUNTER — Encounter: Payer: Self-pay | Admitting: Family Medicine

## 2019-02-20 ENCOUNTER — Encounter: Payer: Self-pay | Admitting: Family Medicine

## 2019-02-20 LAB — CYTOLOGY - PAP
Diagnosis: NEGATIVE
High risk HPV: NEGATIVE
Molecular Disclaimer: 56
Molecular Disclaimer: NORMAL

## 2019-02-21 ENCOUNTER — Other Ambulatory Visit: Payer: Self-pay | Admitting: Family Medicine

## 2019-02-21 DIAGNOSIS — F411 Generalized anxiety disorder: Secondary | ICD-10-CM

## 2019-02-22 DIAGNOSIS — F4312 Post-traumatic stress disorder, chronic: Secondary | ICD-10-CM | POA: Diagnosis not present

## 2019-02-24 ENCOUNTER — Other Ambulatory Visit: Payer: Self-pay | Admitting: Family Medicine

## 2019-02-24 NOTE — Telephone Encounter (Signed)
See note

## 2019-02-24 NOTE — Telephone Encounter (Signed)
Pt calling to check on the status of this medication refill, states that she is almost out.

## 2019-02-25 ENCOUNTER — Encounter: Payer: Self-pay | Admitting: Family Medicine

## 2019-02-25 DIAGNOSIS — F411 Generalized anxiety disorder: Secondary | ICD-10-CM

## 2019-02-25 MED ORDER — CLONAZEPAM 0.5 MG PO TABS: ORAL_TABLET | ORAL | 2 refills | Status: DC

## 2019-02-25 NOTE — Telephone Encounter (Signed)
Last fill 12/02/18  #60/2 Last OV 02/10/19

## 2019-02-25 NOTE — Telephone Encounter (Signed)
Notes recorded by Elnora Morrison L, CMA on 02/13/2019 at 1:31 PM EDT  Notified patient of lab results. Patient verbalized understanding.  Patient gave ok to send in lower dose of Levothyroxine. 112 mcg

## 2019-02-27 ENCOUNTER — Encounter: Payer: Self-pay | Admitting: Family Medicine

## 2019-03-04 MED ORDER — ZOLPIDEM TARTRATE 5 MG PO TABS: 5.0000 mg | ORAL_TABLET | Freq: Every evening | ORAL | 0 refills | Status: DC | PRN

## 2019-03-18 ENCOUNTER — Other Ambulatory Visit: Payer: Self-pay | Admitting: Family Medicine

## 2019-03-19 ENCOUNTER — Other Ambulatory Visit: Payer: Self-pay | Admitting: Family Medicine

## 2019-03-21 MED ORDER — AMPHETAMINE-DEXTROAMPHETAMINE 10 MG PO TABS: 10.0000 mg | ORAL_TABLET | Freq: Three times a day (TID) | ORAL | 0 refills | Status: DC

## 2019-03-21 NOTE — Telephone Encounter (Signed)
Pt called to f/up on this request.   °

## 2019-03-21 NOTE — Telephone Encounter (Signed)
Med refilled. pmp website verified. She is on multiple controlled medications (adderall, klonopin, ambien). Unsure who she is transferring care to.  Orma Flaming, MD [place]

## 2019-03-21 NOTE — Addendum Note (Signed)
Addended by: Orma Flaming on: 03/21/2019 04:21 PM   Modules accepted: Orders

## 2019-03-21 NOTE — Telephone Encounter (Signed)
See note

## 2019-03-30 ENCOUNTER — Other Ambulatory Visit: Payer: Self-pay | Admitting: Family Medicine

## 2019-03-31 MED ORDER — ZOLPIDEM TARTRATE 5 MG PO TABS: 5.0000 mg | ORAL_TABLET | Freq: Every evening | ORAL | 0 refills | Status: DC | PRN

## 2019-03-31 NOTE — Telephone Encounter (Signed)
Ok to refill 

## 2019-04-05 DIAGNOSIS — F4312 Post-traumatic stress disorder, chronic: Secondary | ICD-10-CM | POA: Diagnosis not present

## 2019-04-08 ENCOUNTER — Other Ambulatory Visit: Payer: Self-pay | Admitting: Family Medicine

## 2019-04-08 DIAGNOSIS — M797 Fibromyalgia: Secondary | ICD-10-CM

## 2019-04-08 DIAGNOSIS — N951 Menopausal and female climacteric states: Secondary | ICD-10-CM

## 2019-04-09 ENCOUNTER — Other Ambulatory Visit: Payer: Self-pay | Admitting: Family Medicine

## 2019-04-09 NOTE — Telephone Encounter (Signed)
Rx request 

## 2019-04-10 ENCOUNTER — Encounter (INDEPENDENT_AMBULATORY_CARE_PROVIDER_SITE_OTHER): Payer: Self-pay | Admitting: Family Medicine

## 2019-04-10 ENCOUNTER — Ambulatory Visit (INDEPENDENT_AMBULATORY_CARE_PROVIDER_SITE_OTHER): Payer: Federal, State, Local not specified - PPO | Admitting: Family Medicine

## 2019-04-10 ENCOUNTER — Ambulatory Visit: Payer: Self-pay | Admitting: *Deleted

## 2019-04-10 DIAGNOSIS — M545 Low back pain, unspecified: Secondary | ICD-10-CM

## 2019-04-10 MED ORDER — TAMSULOSIN HCL 0.4 MG PO CAPS: 0.4000 mg | ORAL_CAPSULE | Freq: Every day | ORAL | 3 refills | Status: DC

## 2019-04-10 MED ORDER — DICLOFENAC SODIUM 75 MG PO TBEC: 75.0000 mg | DELAYED_RELEASE_TABLET | Freq: Two times a day (BID) | ORAL | 0 refills | Status: DC

## 2019-04-10 NOTE — Telephone Encounter (Signed)
See note

## 2019-04-10 NOTE — Telephone Encounter (Signed)
Lower back pain that is constant since last night. Does tend to move around some and feels sharp at times.Cloudy urine today but no difficulty voiding/no pain with voiding. Has tried ibuprofen that did not help. Nausea without vomiting.Diarrhea this morning.  Encouraged fluids. No fever. Requesting virtual visit.  Reason for Disposition . MODERATE pain (e.g., interferes with normal activities or awakens from sleep)  Answer Assessment - Initial Assessment Questions 1. LOCATION: "Where does it hurt?" (e.g., left, right)     Lower back pain in the center that moves around 2. ONSET: "When did the pain start?"     yesterday 3. SEVERITY: "How bad is the pain?" (e.g., Scale 1-10; mild, moderate, or severe)   - MILD (1-3): doesn't interfere with normal activities    - MODERATE (4-7): interferes with normal activities or awakens from sleep    - SEVERE (8-10): excruciating pain and patient unable to do normal activities (stays in bed)       7 on the scale 4. PATTERN: "Does the pain come and go, or is it constant?"      Constant but has sharp moments 5. CAUSE: "What do you think is causing the pain?"     Possible kidney stones 6. OTHER SYMPTOMS:  "Do you have any other symptoms?" (e.g., fever, abdominal pain, vomiting, leg weakness, burning with urination, blood in urine)     Nausea, cloudy urine 7. PREGNANCY:  "Is there any chance you are pregnant?" "When was your last menstrual period?"     no  Protocols used: FLANK PAIN-A-AH

## 2019-04-10 NOTE — Telephone Encounter (Signed)
Patient scheduled and was seen.

## 2019-04-10 NOTE — Telephone Encounter (Signed)
Please review

## 2019-04-10 NOTE — Progress Notes (Signed)
   Chief Complaint:  Cassidy Hopkins is a 43 y.o. female who presents today for a virtual office visit with a chief complaint of back pain.   Assessment/Plan:  Back Pain Based on history, consistent with recurrent nephrolithiasis.  Discussed with patient limitations of virtual visit and inability to perform physical exam or lab testing.  Ideally would at least check a urinalysis to confirm nephrolithiasis.  She is unfortunately not able to come into the office today for a urine specimen.  Given her history, we will empirically treat for kidney stone with a course of Flomax and diclofenac.  If symptoms do not improve in the next few days will need at least a urine sample and possibly imaging.  Discussed reasons to return to care and seek emergent care.    Subjective:  HPI:  Back Pain Started last night. Located in lower back. Associated with cloudy urine and diaphoresis.  Has had issues with kidney stones in the past. Tried taking ibuprofen with no significant improvement. Hot bath did not help. No hematuria. No urgency. Having some diarrhea. No other treatments tried. No other obvious aggravating or alleviating factors. Symptoms are worsening. Feels like prior kidney stones.   ROS: Per HPI  PMH: She reports that she has never smoked. She has never used smokeless tobacco. She reports that she does not drink alcohol or use drugs.      Objective/Observations  Physical Exam: Gen: NAD, resting comfortably Pulm: Normal work of breathing Neuro: Grossly normal, moves all extremities Psych: Normal affect and thought content  No results found for this or any previous visit (from the past 24 hour(s)).   Virtual Visit via Video   I connected with Taejah Margrave on 04/10/19 at  1:00 PM EST by a video enabled telemedicine application and verified that I am speaking with the correct person using two identifiers. I discussed the limitations of evaluation and management by telemedicine and the availability of  in person appointments. The patient expressed understanding and agreed to proceed.   Patient location: Home Provider location: [place] participating in the virtual visit: Myself and Patient     Algis Greenhouse. Jerline Pain, MD 04/10/2019 12:57 PM

## 2019-04-13 ENCOUNTER — Other Ambulatory Visit: Payer: Self-pay | Admitting: Family Medicine

## 2019-04-14 MED ORDER — AMPHETAMINE-DEXTROAMPHETAMINE 10 MG PO TABS: 10.0000 mg | ORAL_TABLET | Freq: Three times a day (TID) | ORAL | 0 refills | Status: DC

## 2019-04-14 NOTE — Telephone Encounter (Signed)
Please review

## 2019-04-14 NOTE — Telephone Encounter (Signed)
Rx Request 

## 2019-04-14 NOTE — Telephone Encounter (Signed)
Please let her know that I require anyone on stimulants to be seen every 3 months. Also, I do not fill comfortable filling all of her medications that she is on and we will need to discuss this. She will need to be seen sooner than next June. Will fill this month, but will need to get her in sooner. Also she is a little early getting this refill.  Thanks,  Dr. Rogers Blocker

## 2019-04-16 NOTE — Telephone Encounter (Signed)
Left vm message for patient requesting a c/b.

## 2019-04-21 NOTE — Telephone Encounter (Signed)
Patient has been scheduled for 05/08/19 with Dr. Rogers Blocker for a Virtual Visit.

## 2019-04-24 ENCOUNTER — Telehealth: Payer: Self-pay

## 2019-04-24 NOTE — Telephone Encounter (Signed)
Adderall PA approved through 04/23/2020.  Approval faxed to patient's pharmacy.

## 2019-04-29 ENCOUNTER — Other Ambulatory Visit: Payer: Self-pay | Admitting: Family Medicine

## 2019-04-29 NOTE — Telephone Encounter (Signed)
Rx request 

## 2019-04-30 ENCOUNTER — Other Ambulatory Visit: Payer: Self-pay | Admitting: Family Medicine

## 2019-04-30 MED ORDER — ZOLPIDEM TARTRATE 5 MG PO TABS: 5.0000 mg | ORAL_TABLET | Freq: Every evening | ORAL | 0 refills | Status: DC | PRN

## 2019-05-08 ENCOUNTER — Encounter: Payer: Self-pay | Admitting: Family Medicine

## 2019-05-08 ENCOUNTER — Ambulatory Visit (INDEPENDENT_AMBULATORY_CARE_PROVIDER_SITE_OTHER): Payer: Federal, State, Local not specified - PPO | Admitting: Family Medicine

## 2019-05-08 VITALS — Ht 66.0 in | Wt 240.0 lb

## 2019-05-08 DIAGNOSIS — F411 Generalized anxiety disorder: Secondary | ICD-10-CM | POA: Diagnosis not present

## 2019-05-08 DIAGNOSIS — F909 Attention-deficit hyperactivity disorder, unspecified type: Secondary | ICD-10-CM

## 2019-05-08 DIAGNOSIS — F5101 Primary insomnia: Secondary | ICD-10-CM

## 2019-05-08 DIAGNOSIS — E038 Other specified hypothyroidism: Secondary | ICD-10-CM | POA: Diagnosis not present

## 2019-05-08 DIAGNOSIS — G4700 Insomnia, unspecified: Secondary | ICD-10-CM | POA: Insufficient documentation

## 2019-05-08 NOTE — Progress Notes (Deleted)
Patient: Cassidy Hopkins MRN: [id number] DOB: December 06, 1975 PCP: No primary care provider on file.     Subjective:  Chief Complaint  Patient presents with  . ADHD  . Anxiety  . Insomnia  . Hypothyroidism    HPI: The patient is a 43 y.o. female who presents today for ***  Review of Systems  Constitutional: Negative for fatigue.  Eyes: Negative for visual disturbance.  Respiratory: Negative for shortness of breath.   Cardiovascular: Negative for chest pain, palpitations and leg swelling.  Gastrointestinal: Negative for abdominal pain, diarrhea, nausea and vomiting.  Skin: Negative for rash.  Neurological: Negative for dizziness and headaches.  Psychiatric/Behavioral: Negative for sleep disturbance.    Allergies Patient is allergic to penicillins and topiramate.  Past Medical History Patient  has a past medical history of Breast cancer ([place]) (1997), Hyperlipidemia, Hypertension, Irregular heart rate, Migraines, and Thyroid disease.  Surgical History Patient  has a past surgical history that includes Cholecystectomy (1997); Cesarean section (2009, 2014, 2016); and Breast surgery.  Family History Pateint's family history includes Colon cancer in her maternal grandmother; Diabetes in her father; Heart disease in her father; Hyperlipidemia in her father, maternal grandmother, and mother; Hypertension in her father and mother.  Social History Patient  reports that she has never smoked. She has never used smokeless tobacco. She reports that she does not drink alcohol or use drugs.    Objective: Vitals:   05/08/19 0858  Weight: 240 lb (108.9 kg)  Height: 5\' 6"  (1.676 m)    Body mass index is 38.74 kg/m.  Physical Exam     Assessment/plan:      No follow-ups on file.     @AWME @ 05/08/2019

## 2019-05-08 NOTE — Progress Notes (Deleted)
Patient: Cassidy Hopkins MRN: [id number] DOB: 1975/06/04 PCP: No primary care provider on file.     I connected with Lolitta Hanas on 05/08/19 at @CHLAPPTIME @ by a video enabled telemedicine application and verified that I am speaking with the correct person using two identifiers.  Location patient: Home Location provider: [facility], Office Persons participating in this virtual visit: ***  I discussed the limitations of evaluation and management by telemedicine and the availability of in person appointments. The patient expressed understanding and agreed to proceed.   Subjective:  Chief Complaint  Patient presents with  . ADHD  . Anxiety  . Insomnia  . Hypothyroidism    HPI: The patient is a 43 y.o. female who presents today for ***  Review of Systems  Constitutional: Negative for fatigue.  Eyes: Negative for visual disturbance.  Respiratory: Negative for shortness of breath.   Cardiovascular: Negative for chest pain, palpitations and leg swelling.  Gastrointestinal: Negative for abdominal pain, diarrhea, nausea and vomiting.  Neurological: Negative for dizziness and headaches.  Psychiatric/Behavioral: Negative for sleep disturbance.    Allergies Patient is allergic to penicillins and topiramate.  Past Medical History Patient  has a past medical history of Breast cancer ([place]) (1997), Hyperlipidemia, Hypertension, Irregular heart rate, Migraines, and Thyroid disease.  Surgical History Patient  has a past surgical history that includes Cholecystectomy (1997); Cesarean section (2009, 2014, 2016); and Breast surgery.  Family History Pateint's family history includes Colon cancer in her maternal grandmother; Diabetes in her father; Heart disease in her father; Hyperlipidemia in her father, maternal grandmother, and mother; Hypertension in her father and mother.  Social History Patient  reports that she has never smoked. She has never used smokeless tobacco. She reports that she  does not drink alcohol or use drugs.    Objective: Vitals:   05/08/19 0858  Weight: 240 lb (108.9 kg)  Height: 5\' 6"  (1.676 m)    Body mass index is 38.74 kg/m.  Physical Exam     Depression screen [facility] 2/9 05/08/2019 02/10/2019 06/14/2018 12/03/2017 01/23/2017  Decreased Interest 0 1 1 0 1  Down, Depressed, Hopeless 1 2 1 1 2   PHQ - 2 Score 1 3 2 1 3   Altered sleeping 0 2 1 0 0  Tired, decreased energy 0 2 1 1 1   Change in appetite 0 1 1 1 1   Feeling bad or failure about yourself  0 1 1 1 3   Trouble concentrating 0 1 1 1 3   Moving slowly or fidgety/restless 1 1 0 0 1  Suicidal thoughts 0 0 0 0 0  PHQ-9 Score 2 11 7 5 12   Difficult doing work/chores Not difficult at all Somewhat difficult Somewhat difficult Not difficult at all Somewhat difficult   GAD 7 : Generalized Anxiety Score 05/08/2019 06/14/2018 01/23/2017 08/02/2016  Nervous, Anxious, on Edge 1 1 3 3   Control/stop worrying 0 1 2 1   Worry too much - different things 0 2 2 1   Trouble relaxing 1 2 3 2   Restless 0 1 1 3   Easily annoyed or irritable 1 2 2 1   Afraid - awful might happen 1 2 3 1   Total GAD 7 Score 4 11 16 12   Anxiety Difficulty Not difficult at all Somewhat difficult - -     Assessment/plan:      No follow-ups on file.  Records requested if needed. Time spent with patient: *** minutes, of which >50% was spent in obtaining information about her symptoms, reviweing her previous labs,  evaluations, and treatments, counseling her about her conditions (please see discussed topics above), and developing a plan to further investigate it; she had a number of questions which I addressed.    Orma Flaming, MD [place]  05/08/2019

## 2019-05-08 NOTE — Progress Notes (Signed)
Patient: Cassidy Hopkins MRN: [id number] DOB: Oct 03, 1975 PCP: No primary care provider on file.     I connected with Tempy Alcindor on 05/08/19 at 9:25am by a video enabled telemedicine application and verified that I am speaking with the correct person using two identifiers.  Location patient: Home Location provider: [facility], Office Persons participating in this virtual visit: Waunetta Trundle and DR. Rogers Blocker  I discussed the limitations of evaluation and management by telemedicine and the availability of in person appointments. The patient expressed understanding and agreed to proceed.   Subjective:  Chief Complaint  Patient presents with  . ADHD  . Anxiety  . Insomnia  . Hypothyroidism    HPI: The patient is a 43 y.o. female who presents today for transfer of care from Dr. Juleen China as well as routine f/u for ADHD. Also has a hx of hypothyroid with abnormal labs from september with no follow up.   1) ADHD: she was diagnosed 3 years ago, but not officially. Dr. Juleen China is the one who told her she has this. Was never officially diagnosed. Started on medication about 3 years ago. She is currently on adderall 10mg  TID. She has no side effects from these medications and is happy with current dosing. She does not take drug holidays. Her daughter was just diagnosed with adhd as well.   2) anxiety: currently on effexor and klonopin. After her sister killed herself she was told by Dr. Juleen China she had PTSD. She also has social anxiety and has had this her whole life. The klonopin works well for her and she likes Agricultural consultant as well. She does not need a refill of either of these medications.   3) insomnia: currently on ambien 5mg  qhs. She tried trazodone and couldn't tolerate this. Dr. Juleen China wanted to "knock" her out due to PTSD. She has not taken this much lately. Her sleep is getting better.   4) hypothyroidism: labs were abnormal in September and her dosage was dropped. She has not had any lab work  to recheck these levels. She denies any symptoms.   5) she is currently dealing with depression as her sister just committed suicide in 17-Jan-2023 and her  Mom died 4 days later.   Review of Systems  Constitutional: Negative for chills, fatigue and fever.  HENT: Negative for dental problem, ear pain, hearing loss and trouble swallowing.   Eyes: Negative for visual disturbance.  Respiratory: Negative for cough, chest tightness and shortness of breath.   Cardiovascular: Negative for chest pain, palpitations and leg swelling.  Gastrointestinal: Negative for abdominal pain, blood in stool, diarrhea and nausea.  Endocrine: Negative for cold intolerance, polydipsia, polyphagia and polyuria.  Genitourinary: Negative for dysuria and hematuria.  Musculoskeletal: Negative for arthralgias.  Skin: Negative for rash.  Neurological: Negative for dizziness and headaches.  Psychiatric/Behavioral: Negative for dysphoric mood and sleep disturbance. The patient is not nervous/anxious.     Allergies Patient is allergic to penicillins and topiramate.  Past Medical History Patient  has a past medical history of Hyperlipidemia, Hypertension, Irregular heart rate, Migraines, and Thyroid disease.  Surgical History Patient  has a past surgical history that includes Cholecystectomy (1997); Cesarean section (2009, 2014, 2016); and Breast surgery.  Family History Pateint's family history includes Colon cancer in her maternal grandmother; Diabetes in her father; Heart disease in her father; Hyperlipidemia in her father, maternal grandmother, and mother; Hypertension in her father and mother.  Social History Patient  reports that she has never smoked. She has never  used smokeless tobacco. She reports that she does not drink alcohol or use drugs.    Objective: Vitals:   05/08/19 0858  Weight: 240 lb (108.9 kg)  Height: 5\' 6"  (1.676 m)    Body mass index is 38.74 kg/m.  Physical Exam Vitals reviewed.   Constitutional:      Appearance: Normal appearance.  HENT:     Head: Normocephalic and atraumatic.  Pulmonary:     Effort: Pulmonary effort is normal.  Neurological:     General: No focal deficit present.     Mental Status: She is alert and oriented to person, place, and time.  Psychiatric:        Mood and Affect: Mood normal.        Behavior: Behavior normal.     Comments: No si/hi/ah/vh           Office Visit from 05/08/2019 in [place]  PHQ-9 Total Score  2     GAD 7 : Generalized Anxiety Score 05/08/2019 06/14/2018 01/23/2017 08/02/2016  Nervous, Anxious, on Edge 1 1 3 3   Control/stop worrying 0 1 2 1   Worry too much - different things 0 2 2 1   Trouble relaxing 1 2 3 2   Restless 0 1 1 3   Easily annoyed or irritable 1 2 2 1   Afraid - awful might happen 1 2 3 1   Total GAD 7 Score 4 11 16 12   Anxiety Difficulty Not difficult at all Somewhat difficult - -     Assessment/plan: 1. Generalized anxiety disorder phq9 score and GAD7 score all all well controlled. Stable on current therapy and will continue current dosage of effexor. discussed we could go up on this if we need to, but she is well controlled at this time. F/u in 6 months.   2. Adult ADHD discussed she needs to get officially tested and she is on board with this. Will continue her medication until her testing is complete and will give her 3-6 months to get this done. PMP website checked and she is filling this appropriately.   3. Primary insomnia Taking her off ambien. On too many drugs/polypharmacy. She is on board with this. Sleep hygiene and will do trial of ashwagandha. Sleep has been improving on own as well.   4. Other specified hypothyroidism Come in for labs to recheck levels on new dose.  - TSH; Future - T4, free; Future  Return in about 3 months (around 08/06/2019) for adhd.  Records requested if needed. Time spent with patient: 25 minutes, of which >50% was spent in  obtaining information about her symptoms, reviweing her previous labs, evaluations, and treatments, counseling her about her conditions (please see discussed topics above), and developing a plan to further investigate it; she had a number of questions which I addressed.    Orma Flaming, MD [place]  05/08/2019

## 2019-05-10 ENCOUNTER — Other Ambulatory Visit: Payer: Self-pay | Admitting: Family Medicine

## 2019-05-12 NOTE — Telephone Encounter (Signed)
Rx request 

## 2019-05-14 ENCOUNTER — Other Ambulatory Visit: Payer: Self-pay | Admitting: Family Medicine

## 2019-05-14 MED ORDER — AMPHETAMINE-DEXTROAMPHETAMINE 10 MG PO TABS: 10.0000 mg | ORAL_TABLET | Freq: Three times a day (TID) | ORAL | 0 refills | Status: DC

## 2019-05-14 NOTE — Telephone Encounter (Signed)
Rx request 

## 2019-05-19 ENCOUNTER — Other Ambulatory Visit: Payer: Self-pay | Admitting: Family Medicine

## 2019-05-19 DIAGNOSIS — F411 Generalized anxiety disorder: Secondary | ICD-10-CM

## 2019-05-19 NOTE — Telephone Encounter (Signed)
LAST APPOINTMENT DATE: @12 /17/2020  NEXT APPOINTMENT DATE:@9 /24/2021   LAST REFILL:02/25/2019 #60 with 2 rf   QTY:

## 2019-05-20 ENCOUNTER — Telehealth: Payer: Self-pay

## 2019-05-20 NOTE — Telephone Encounter (Signed)
Copied from [place] [id number]. Topic: Appointment Scheduling - Scheduling Inquiry for Clinic >> May 20, 2019  8:49 AM Rayann Heman wrote: Reason for CRM: pt called and stated that she would like a call back from the nurse regarding if she needs to come in for a lab or if she can wait for a 3 month follow up.

## 2019-05-20 NOTE — Telephone Encounter (Signed)
Spoke with patient.  Advised she should not wait until 3 month follow OV with Dr. Rogers Blocker for labs.  Lab appt scheduled for 1/8.  Follow up appointment with Dr. Rogers Blocker scheduled for 3/19.  Patient verbalized understanding.

## 2019-05-30 ENCOUNTER — Encounter: Payer: Self-pay | Admitting: Family Medicine

## 2019-05-30 ENCOUNTER — Other Ambulatory Visit (INDEPENDENT_AMBULATORY_CARE_PROVIDER_SITE_OTHER): Payer: Federal, State, Local not specified - PPO

## 2019-05-30 ENCOUNTER — Other Ambulatory Visit: Payer: Self-pay

## 2019-05-30 ENCOUNTER — Other Ambulatory Visit: Payer: Self-pay | Admitting: Family Medicine

## 2019-05-30 DIAGNOSIS — E039 Hypothyroidism, unspecified: Secondary | ICD-10-CM

## 2019-05-30 DIAGNOSIS — E038 Other specified hypothyroidism: Secondary | ICD-10-CM | POA: Diagnosis not present

## 2019-05-30 LAB — T4, FREE: Free T4: 0.92 ng/dL (ref 0.60–1.60)

## 2019-05-30 LAB — TSH: TSH: 0.36 u[IU]/mL (ref 0.35–4.50)

## 2019-05-30 MED ORDER — LEVOTHYROXINE SODIUM 100 MCG PO TABS: 100.0000 ug | ORAL_TABLET | Freq: Every day | ORAL | 0 refills | Status: DC

## 2019-06-02 DIAGNOSIS — F4312 Post-traumatic stress disorder, chronic: Secondary | ICD-10-CM | POA: Diagnosis not present

## 2019-06-10 ENCOUNTER — Other Ambulatory Visit: Payer: Self-pay | Admitting: Family Medicine

## 2019-06-10 DIAGNOSIS — F411 Generalized anxiety disorder: Secondary | ICD-10-CM

## 2019-06-10 NOTE — Telephone Encounter (Signed)
Rx request 

## 2019-06-12 ENCOUNTER — Other Ambulatory Visit: Payer: Self-pay | Admitting: Family Medicine

## 2019-06-13 NOTE — Telephone Encounter (Signed)
[place] Controlled Substance Database checked. Last filled on 05/17/19  Last OV 05/08/19 Next OV 08/08/19

## 2019-06-14 ENCOUNTER — Encounter: Payer: Self-pay | Admitting: Family Medicine

## 2019-06-14 DIAGNOSIS — F4312 Post-traumatic stress disorder, chronic: Secondary | ICD-10-CM | POA: Diagnosis not present

## 2019-06-15 ENCOUNTER — Other Ambulatory Visit: Payer: Self-pay | Admitting: Family Medicine

## 2019-06-15 DIAGNOSIS — F411 Generalized anxiety disorder: Secondary | ICD-10-CM

## 2019-06-15 MED ORDER — AMPHETAMINE-DEXTROAMPHETAMINE 10 MG PO TABS: 10.0000 mg | ORAL_TABLET | Freq: Three times a day (TID) | ORAL | 0 refills | Status: DC

## 2019-06-23 ENCOUNTER — Other Ambulatory Visit: Payer: Self-pay

## 2019-06-23 MED ORDER — HYDROCHLOROTHIAZIDE 25 MG PO TABS: 25.0000 mg | ORAL_TABLET | Freq: Every day | ORAL | 1 refills | Status: DC

## 2019-07-02 ENCOUNTER — Other Ambulatory Visit: Payer: Self-pay | Admitting: Family Medicine

## 2019-07-02 DIAGNOSIS — M797 Fibromyalgia: Secondary | ICD-10-CM

## 2019-07-02 DIAGNOSIS — N951 Menopausal and female climacteric states: Secondary | ICD-10-CM

## 2019-07-02 NOTE — Telephone Encounter (Signed)
Dr. Shelby Mattocks patient now. thanks

## 2019-07-08 ENCOUNTER — Other Ambulatory Visit: Payer: Self-pay | Admitting: Family Medicine

## 2019-07-08 NOTE — Telephone Encounter (Signed)
Pharmacy requesting rx refill:    amphetamine-dextroamphetamine (ADDERALL) 10 MG tablet  LOV: 02/10/2019 NOV: 08/08/2019  Approve?

## 2019-07-09 ENCOUNTER — Encounter: Payer: Self-pay | Admitting: Family Medicine

## 2019-07-10 ENCOUNTER — Other Ambulatory Visit: Payer: Self-pay | Admitting: Family Medicine

## 2019-07-10 DIAGNOSIS — F411 Generalized anxiety disorder: Secondary | ICD-10-CM

## 2019-07-10 NOTE — Telephone Encounter (Signed)
Pt needs Rx's sent again due to birth date was wrong on insurance card and was not able to pick up Rx's. Please resend new Rx's. Thanks

## 2019-07-11 ENCOUNTER — Encounter: Payer: Self-pay | Admitting: Family Medicine

## 2019-07-11 MED ORDER — CLONAZEPAM 0.5 MG PO TABS: 0.5000 mg | ORAL_TABLET | Freq: Two times a day (BID) | ORAL | 0 refills | Status: DC

## 2019-07-11 MED ORDER — AMPHETAMINE-DEXTROAMPHETAMINE 10 MG PO TABS: 10.0000 mg | ORAL_TABLET | Freq: Three times a day (TID) | ORAL | 0 refills | Status: DC

## 2019-07-19 DIAGNOSIS — F4312 Post-traumatic stress disorder, chronic: Secondary | ICD-10-CM | POA: Diagnosis not present

## 2019-08-06 ENCOUNTER — Other Ambulatory Visit: Payer: Self-pay

## 2019-08-06 ENCOUNTER — Other Ambulatory Visit: Payer: Self-pay | Admitting: Family Medicine

## 2019-08-06 ENCOUNTER — Emergency Department (INDEPENDENT_AMBULATORY_CARE_PROVIDER_SITE_OTHER): Payer: Federal, State, Local not specified - PPO

## 2019-08-06 ENCOUNTER — Emergency Department
Admission: EM | Admit: 2019-08-06 | Discharge: 2019-08-06 | Disposition: A | Discharge status: Home / Self Care | Payer: Federal, State, Local not specified - PPO | Source: Home / Self Care | Attending: Family Medicine | Admitting: Family Medicine

## 2019-08-06 DIAGNOSIS — F411 Generalized anxiety disorder: Secondary | ICD-10-CM

## 2019-08-06 DIAGNOSIS — J069 Acute upper respiratory infection, unspecified: Secondary | ICD-10-CM | POA: Diagnosis not present

## 2019-08-06 DIAGNOSIS — B0089 Other herpesviral infection: Secondary | ICD-10-CM | POA: Diagnosis not present

## 2019-08-06 DIAGNOSIS — R0602 Shortness of breath: Secondary | ICD-10-CM

## 2019-08-06 DIAGNOSIS — R05 Cough: Secondary | ICD-10-CM

## 2019-08-06 LAB — POCT CBC W AUTO DIFF (K'VILLE URGENT CARE)

## 2019-08-06 MED ORDER — ACYCLOVIR 200 MG PO CAPS: 200.0000 mg | ORAL_CAPSULE | Freq: Three times a day (TID) | ORAL | 0 refills | Status: AC

## 2019-08-06 NOTE — Discharge Instructions (Addendum)
Take plain guaifenesin (1200mg  extended release tabs such as Mucinex) twice daily, with plenty of water, for cough and congestion.  May add Pseudoephedrine (30mg , one or two every 4 to 6 hours) for sinus congestion.  Get adequate rest.   May use Afrin nasal spray (or generic oxymetazoline) each morning for about 5 days and then discontinue.  Also recommend using saline nasal spray several times daily and saline nasal irrigation (AYR is a common brand).  Use Flonase nasal spray each morning after using Afrin nasal spray and saline nasal irrigation. Try warm salt water gargles for sore throat.  Stop all antihistamines for now, and other non-prescription cough/cold preparations. May take Ibuprofen 200mg , 4 tabs every 8 hours with food for body aches, headache, etc. May take Delsym Cough Suppressant at bedtime for nighttime cough.   If your COVID19 test is positive, then you are infected with the novel coronavirus and could give the virus to others.  Please continue isolation at home for at least 10 days since the start of your symptoms.  Once you complete your 10 day quarantine, you may return to normal activities as long as you've not had a fever for over 24 hours (without taking fever reducing medicine) and your symptoms are improving. Please continue good preventive care measures, including:  frequent hand-washing, avoid touching your face, cover coughs/sneezes, stay out of crowds and keep a 6 foot distance from others.  Go to the nearest hospital emergency room if fever/cough/breathlessness are severe or illness seems like a threat to life.

## 2019-08-06 NOTE — ED Triage Notes (Signed)
Patient presents to Urgent Care with complaints of intermittent wheezing worse when she lays down since about 10 days ago. Patient reports she does feel slightly short of breath when it is happening. Would also like refill of acyclovir for a wart flare up on her right thumb.

## 2019-08-06 NOTE — ED Provider Notes (Signed)
[facility]    CSN: [id number] Arrival date & time: 08/06/19  0941      History   Chief Complaint Chief Complaint  Patient presents with  . Medication Refill  . Wheezing    HPI Cassidy Hopkins is a 44 y.o. female.   Several days ago patient developed a non-productive cough, mild headache, fatigue, myalgias, increased sinus congestion, and tightness in her anterior chest.  She has a history of seasonal rhinitis, but not usually this time of year.  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 She has a past history of pneumonia.  The history is provided by the patient.    Past Medical History:  Diagnosis Date  . Hyperlipidemia   . Hypertension   . Irregular heart rate   . Migraines   . Thyroid disease     Patient Active Problem List   Diagnosis Date Noted  . Insomnia 05/08/2019  . Right ureteral stone 11/04/2018  . Adult ADHD 12/03/2017  . Social anxiety disorder 12/03/2017  . Multiple lipomas 07/07/2016  . Generalized anxiety disorder 03/31/2016  . Hypothyroidism 01/17/2016  . Pseudotumor cerebri 01/17/2016  . Obesity (BMI 30-39.9) 01/17/2016    Past Surgical History:  Procedure Laterality Date  . BREAST SURGERY    . CESAREAN SECTION  2009, 2014, 2016  . CHOLECYSTECTOMY  1997    OB History   No obstetric history on file.      Home Medications    Prior to Admission medications   Medication Sig Start Date End Date Taking? Authorizing Provider  acetaZOLAMIDE (DIAMOX) 250 MG tablet Take 1 tablet (250 mg total) by mouth daily. 02/17/19   Briscoe Deutscher, DO  acyclovir (ZOVIRAX) 200 MG capsule Take 1 capsule (200 mg total) by mouth 3 (three) times daily for 10 days. 08/06/19 08/16/19  Kandra Nicolas, MD  amphetamine-dextroamphetamine (ADDERALL) 10 MG tablet Take 1 tablet (10 mg total) by mouth 3 (three) times daily. 07/11/19   Orma Flaming, MD    cetirizine (ZYRTEC) 10 MG tablet Take by mouth.    [provider]  clonazePAM (KLONOPIN) 0.5 MG tablet Take 1 tablet (0.5 mg total) by mouth 2 (two) times daily. 07/11/19   Orma Flaming, MD  diclofenac (VOLTAREN) 75 MG EC tablet Take 1 tablet (75 mg total) by mouth 2 (two) times daily. 04/10/19   Vivi Barrack, MD  hydrochlorothiazide (HYDRODIURIL) 25 MG tablet Take 1 tablet (25 mg total) by mouth daily. 06/23/19   Orma Flaming, MD  levothyroxine (SYNTHROID) 100 MCG tablet Take 1 tablet (100 mcg total) by mouth daily before breakfast. Labs in 6-8 weeks 05/30/19   Orma Flaming, MD  tamsulosin (FLOMAX) 0.4 MG CAPS capsule Take 1 capsule (0.4 mg total) by mouth daily. 04/10/19   Vivi Barrack, MD  venlafaxine XR (EFFEXOR-XR) 75 MG 24 hr capsule TAKE 1 CAPSULE BY MOUTH EVERY DAY WITH BREAKFAST 07/02/19   Orma Flaming, MD    Family History Family History  Problem Relation Age of Onset  . Hypertension Mother   . Hyperlipidemia Mother   . Hyperlipidemia Father   . Heart disease Father   . Hypertension Father   . Diabetes Father   . Colon cancer Maternal Grandmother   . Hyperlipidemia Maternal Grandmother     Social History Social History   Tobacco Use  . Smoking status: Never Smoker  . Smokeless tobacco: Never  Used  Substance Use Topics  . Alcohol use: No  . Drug use: No     Allergies   Penicillins and Topiramate   Review of Systems Review of Systems No sore throat + cough No pleuritic pain but feels tight in anterior chest + wheezing + nasal congestion + post-nasal drainage No sinus pain/pressure No itchy/red eyes No earache No hemoptysis + mild SOB No fever/chills No nausea No vomiting No abdominal pain No diarrhea No urinary symptoms No skin rash + fatigue + myalgias + headache    Physical Exam Triage Vital Signs ED Triage Vitals  Enc Vitals Group     BP 08/06/19 0957 137/84     Pulse Rate 08/06/19 0957 88     Resp 08/06/19 0957 18      Temp 08/06/19 0957 98.7 F (37.1 C)     Temp Source 08/06/19 0957 Oral     SpO2 08/06/19 0957 98 %     Weight --      Height --      Head Circumference --      Peak Flow --      Pain Score 08/06/19 0954 3     Pain Loc --      Pain Edu? --      Excl. in [place]? --    No data found.  Updated Vital Signs BP 137/84 (BP Location: Right Arm)   Pulse 88   Temp 98.7 F (37.1 C) (Oral)   Resp 18   LMP 07/16/2019   SpO2 98%   Visual Acuity Right Eye Distance:   Left Eye Distance:   Bilateral Distance:    Right Eye Near:   Left Eye Near:    Bilateral Near:     Physical Exam Nursing notes and Vital Signs reviewed. Appearance:  Patient appears stated age, and in no acute distress Eyes:  Pupils are equal, round, and reactive to light and accomodation.  Extraocular movement is intact.  Conjunctivae are not inflamed  Ears:  Canals normal.  Tympanic membranes normal.  Nose:  Mildly congested turbinates.  No sinus tenderness.   Pharynx:  Normal Neck:  Supple.  Mildly enlarged lateral nodes are present, tender to palpation on the left.   Lungs:  Clear to auscultation.  Breath sounds are equal.  Moving air well. Heart:  Regular rate and rhythm without murmurs, rubs, or gallops.  Abdomen:  Nontender without masses or hepatosplenomegaly.  Bowel sounds are present.  No CVA or flank tenderness.  Extremities:  No edema.  Skin:  No rash present.  Left thumb tip is mildly swollen and tender to palpation  UC Treatments / Results  Labs (all labs ordered are listed, but only abnormal results are displayed) Labs Reviewed  NOVEL CORONAVIRUS, NAA  POCT CBC W AUTO DIFF ([facility]):  WBC 8.2; LY 26.9; MO 3.3; GR 69.8; Hgb 15.4; Platelets 331     EKG   Radiology DG Chest 2 View  Result Date: 08/06/2019 CLINICAL DATA:  Cough and wheezing with fatigue EXAM: CHEST - 2 VIEW COMPARISON:  None. FINDINGS: Lungs are clear. Heart size and pulmonary vascularity are normal. No adenopathy. No  bone lesions. IMPRESSION: No abnormality noted. Electronically Signed   By: Lowella Grip III M.D.   On: 08/06/2019 11:19    Procedures Procedures (including critical care time)  Medications Ordered in UC Medications - No data to display  Initial Impression / Assessment and Plan / UC Course  I have reviewed the triage vital  signs and the nursing notes.  Pertinent labs & imaging results that were available during my care of the patient were reviewed by me and considered in my medical decision making (see chart for details).    Benign exam.  Normal chest X-ray and WBC reassuring.  There is no evidence of bacterial infection today.  Treat symptomatically for now  Begin zovirax for Herpetic whitlow. Followup with Family Doctor if not improved in about 10 days. COVID19 send out   Final Clinical Impressions(s) / UC Diagnoses   Final diagnoses:  Shortness of breath  Viral URI with cough  Recurrent herpetic whitlow     Discharge Instructions     Take plain guaifenesin (1200mg  extended release tabs such as Mucinex) twice daily, with plenty of water, for cough and congestion.  May add Pseudoephedrine (30mg , one or two every 4 to 6 hours) for sinus congestion.  Get adequate rest.   May use Afrin nasal spray (or generic oxymetazoline) each morning for about 5 days and then discontinue.  Also recommend using saline nasal spray several times daily and saline nasal irrigation (AYR is a common brand).  Use Flonase nasal spray each morning after using Afrin nasal spray and saline nasal irrigation. Try warm salt water gargles for sore throat.  Stop all antihistamines for now, and other non-prescription cough/cold preparations. May take Ibuprofen 200mg , 4 tabs every 8 hours with food for body aches, headache, etc. May take Delsym Cough Suppressant at bedtime for nighttime cough.   If your COVID19 test is positive, then you are infected with the novel coronavirus and could give the virus to  others.  Please continue isolation at home for at least 10 days since the start of your symptoms.  Once you complete your 10 day quarantine, you may return to normal activities as long as you've not had a fever for over 24 hours (without taking fever reducing medicine) and your symptoms are improving. Please continue good preventive care measures, including:  frequent hand-washing, avoid touching your face, cover coughs/sneezes, stay out of crowds and keep a 6 foot distance from others.  Go to the nearest hospital emergency room if fever/cough/breathlessness are severe or illness seems like a threat to life.     ED Prescriptions    Medication Sig Dispense Auth. Provider   acyclovir (ZOVIRAX) 200 MG capsule Take 1 capsule (200 mg total) by mouth 3 (three) times daily for 10 days. 30 capsule Kandra Nicolas, MD        Kandra Nicolas, MD 08/07/19 1315

## 2019-08-07 LAB — NOVEL CORONAVIRUS, NAA: SARS-CoV-2, NAA: NOT DETECTED

## 2019-08-07 MED ORDER — AMPHETAMINE-DEXTROAMPHETAMINE 10 MG PO TABS: 10.0000 mg | ORAL_TABLET | Freq: Three times a day (TID) | ORAL | 0 refills | Status: DC

## 2019-08-07 MED ORDER — CLONAZEPAM 0.5 MG PO TABS: 0.5000 mg | ORAL_TABLET | Freq: Two times a day (BID) | ORAL | 0 refills | Status: DC

## 2019-08-07 NOTE — Telephone Encounter (Signed)
LAST APPOINTMENT DATE: 05/08/2019 NEXT APPOINTMENT DATE:08/08/2019  Rx Klonopin 0.5mg  #60 last refill on 07/11/2019 by Dr Barron Schmid   Rx Adderrol 10mg  #90 Marcelyn Ditty last refill on 02/192021  by Dr Rogers Blocker

## 2019-08-08 ENCOUNTER — Other Ambulatory Visit: Payer: Self-pay

## 2019-08-08 ENCOUNTER — Ambulatory Visit (INDEPENDENT_AMBULATORY_CARE_PROVIDER_SITE_OTHER): Payer: Federal, State, Local not specified - PPO | Admitting: Family Medicine

## 2019-08-08 ENCOUNTER — Encounter: Payer: Self-pay | Admitting: Family Medicine

## 2019-08-08 VITALS — BP 130/80 | Ht 66.0 in | Wt 242.0 lb

## 2019-08-08 DIAGNOSIS — F5101 Primary insomnia: Secondary | ICD-10-CM

## 2019-08-08 DIAGNOSIS — F909 Attention-deficit hyperactivity disorder, unspecified type: Secondary | ICD-10-CM

## 2019-08-08 DIAGNOSIS — E038 Other specified hypothyroidism: Secondary | ICD-10-CM | POA: Diagnosis not present

## 2019-08-08 DIAGNOSIS — F4010 Social phobia, unspecified: Secondary | ICD-10-CM | POA: Diagnosis not present

## 2019-08-08 NOTE — Progress Notes (Deleted)
Patient: Cassidy Hopkins MRN: [id number] DOB: 07-Nov-1975 PCP: Orma Flaming, MD     Subjective:  No chief complaint on file.   HPI: The patient is a 44 y.o. female who presents today for Hypothyroidism.   Review of Systems  Constitutional: Negative for chills, fatigue and fever.  HENT: Negative for sinus pressure, sinus pain and sore throat.   Respiratory: Negative for cough, shortness of breath and wheezing.   Cardiovascular: Negative for chest pain and palpitations.  Gastrointestinal: Negative for abdominal pain, nausea and vomiting.  Genitourinary: Negative for frequency, pelvic pain and urgency.  Neurological: Negative for dizziness and light-headedness.    Allergies Patient is allergic to penicillins and topiramate.  Past Medical History Patient  has a past medical history of Hyperlipidemia, Hypertension, Irregular heart rate, Migraines, and Thyroid disease.  Surgical History Patient  has a past surgical history that includes Cholecystectomy (1997); Cesarean section (2009, 2014, 2016); and Breast surgery.  Family History Pateint's family history includes Colon cancer in her maternal grandmother; Diabetes in her father; Heart disease in her father; Hyperlipidemia in her father, maternal grandmother, and mother; Hypertension in her father and mother.  Social History Patient  reports that she has never smoked. She has never used smokeless tobacco. She reports that she does not drink alcohol or use drugs.    Objective: There were no vitals filed for this visit.  There is no height or weight on file to calculate BMI.  Physical Exam     Assessment/plan:      No follow-ups on file.     @[facility] @ 08/08/2019

## 2019-08-08 NOTE — Progress Notes (Signed)
Patient: Cassidy Hopkins MRN: [id number] DOB: 1976/02/23 PCP: Orma Flaming, MD     I connected with Cassidy Hopkins on 08/08/19 at 9:55am by a video enabled telemedicine application and verified that I am speaking with the correct person using two identifiers.  Location patient: Home Location provider: [facility], Office Persons participating in this virtual visit: Cassidy Hopkins and dr. Rogers Blocker   I discussed the limitations of evaluation and management by telemedicine and the availability of in person appointments. The patient expressed understanding and agreed to proceed.   Subjective:  Chief Complaint  Patient presents with  . Hypothyroidism  . Covid Exposure    Pt says that her results were-Not detected  . ADHD    HPI: The patient is a 44 y.o. female who presents today for adhd follow up.   Adhd: she is currently on adderall 10mg  TID. Dosage is working well for her. Fills correctly. pmp website reviewed. She is trying to get this set up.   Hypothyroidism: I changed her dosage as she was running a little hyper. repat labs were wnl. She is feeling better on this dose.   Insomnia: weaned her off [place].. she is doing fine off of this. Glad to be off of this. She does a bedtime ritual and is taking melatonin as needed.   Review of Systems  Constitutional: Negative for chills, fatigue and fever.  HENT: Negative for dental problem, ear pain, hearing loss and trouble swallowing.   Eyes: Negative for visual disturbance.  Respiratory: Negative for cough, chest tightness and shortness of breath.   Cardiovascular: Negative for chest pain, palpitations and leg swelling.  Gastrointestinal: Negative for abdominal pain, blood in stool, diarrhea and nausea.  Endocrine: Negative for cold intolerance, polydipsia, polyphagia and polyuria.  Genitourinary: Negative for dysuria and hematuria.  Musculoskeletal: Negative for arthralgias.  Skin: Negative for rash.  Neurological: Negative for dizziness and  headaches.  Psychiatric/Behavioral: Negative for dysphoric mood and sleep disturbance. The patient is not nervous/anxious.     Allergies Patient is allergic to penicillins and topiramate.  Past Medical History Patient  has a past medical history of Hyperlipidemia, Hypertension, Irregular heart rate, Migraines, and Thyroid disease.  Surgical History Patient  has a past surgical history that includes Cholecystectomy (1997); Cesarean section (2009, 2014, 2016); and Breast surgery.  Family History Pateint's family history includes Colon cancer in her maternal grandmother; Diabetes in her father; Heart disease in her father; Hyperlipidemia in her father, maternal grandmother, and mother; Hypertension in her father and mother.  Social History Patient  reports that she has never smoked. She has never used smokeless tobacco. She reports that she does not drink alcohol or use drugs.    Objective: Vitals:   08/08/19 0927  BP: 130/80  Weight: 242 lb (109.8 kg)  Height: 5\' 6"  (1.676 m)    Body mass index is 39.06 kg/m.  Physical Exam Vitals reviewed.  Constitutional:      Appearance: Normal appearance.  HENT:     Head: Normocephalic and atraumatic.  Pulmonary:     Effort: Pulmonary effort is normal.  Neurological:     General: No focal deficit present.     Mental Status: She is alert and oriented to person, place, and time.  Psychiatric:        Mood and Affect: Mood normal.        Behavior: Behavior normal.        GAD 7 : Generalized Anxiety Score 08/08/2019 05/08/2019 06/14/2018 01/23/2017  Nervous, Anxious, on Edge  1 1 1 3   Control/stop worrying 0 0 1 2  Worry too much - different things 0 0 2 2  Trouble relaxing 0 1 2 3   Restless 0 0 1 1  Easily annoyed or irritable 0 1 2 2   Afraid - awful might happen 0 1 2 3   Total GAD 7 Score 1 4 11 16   Anxiety Difficulty Not difficult at all Not difficult at all Somewhat difficult -     Assessment/plan: 1. Adult ADHD Pmp website  verified and filling correctly. Well controlled on current dosage. She is working on getting testing and will send me results. F/u in 6 months so we can do her annual at same time.   2. Primary insomnia Doing great off her Lorrin Mais and am glad she weaned off of this. Continue with melatonin. Very proud of her.   3. Social anxiety disorder gad7 to goal and extremely well controlled. pmp website verified and she fills correctly. Also has good coping skills. F/u in 6 months. Just refilled her medication.   4. Other specified hypothyroidism tsh to goal. Continue current dosage and f/u in 6 months for routine annual and repeat labs.       Return in about 6 months (around 02/08/2020) for annual/fasting labs and ADHD.  Records requested if needed. Time spent with patient: 20 minutes, of which >50% was spent in obtaining information about her symptoms, reviweing her previous labs, evaluations, and treatments, counseling her about her conditions (please see discussed topics above), and developing a plan to further investigate it; she had a number of questions which I addressed.    Orma Flaming, MD [place]  08/08/2019

## 2019-08-16 DIAGNOSIS — F4312 Post-traumatic stress disorder, chronic: Secondary | ICD-10-CM | POA: Diagnosis not present

## 2019-08-20 ENCOUNTER — Other Ambulatory Visit: Payer: Self-pay | Admitting: Family Medicine

## 2019-08-30 DIAGNOSIS — F4312 Post-traumatic stress disorder, chronic: Secondary | ICD-10-CM | POA: Diagnosis not present

## 2019-09-01 ENCOUNTER — Other Ambulatory Visit: Payer: Self-pay | Admitting: Family Medicine

## 2019-09-01 DIAGNOSIS — F411 Generalized anxiety disorder: Secondary | ICD-10-CM

## 2019-09-03 ENCOUNTER — Other Ambulatory Visit: Payer: Self-pay

## 2019-09-03 NOTE — Telephone Encounter (Signed)
Pt is requesting Adderall 10 mg tab; And Klonopin 0.5 mg tab  LOV: 08/08/19 Next Visit: 02/13/20  Last refill: 08/07/2019  Approve?

## 2019-09-04 ENCOUNTER — Other Ambulatory Visit: Payer: Self-pay | Admitting: Family Medicine

## 2019-09-04 DIAGNOSIS — F411 Generalized anxiety disorder: Secondary | ICD-10-CM

## 2019-09-04 MED ORDER — AMPHETAMINE-DEXTROAMPHETAMINE 10 MG PO TABS: 10.0000 mg | ORAL_TABLET | Freq: Three times a day (TID) | ORAL | 0 refills | Status: DC

## 2019-09-04 NOTE — Telephone Encounter (Signed)
Pt requesting Clonazepam 0.5 mg and Adderall   LOV:08/08/2019 Next Visit: 02/13/20  Last refill: 08/08/2019  Approve?

## 2019-09-04 NOTE — Telephone Encounter (Signed)
LAST APPOINTMENT DATE: 08/08/2019 NEXT APPOINTMENT DATE:02/13/2020  Rx Clobazeoan 0.5mg  LAST REFILL: 08/07/2019 QTY:60 0 Rf  Rx Adderall 10mg  LAST REFILL: 08/07/2019 QTY:90 0 Rf

## 2019-09-13 DIAGNOSIS — F4312 Post-traumatic stress disorder, chronic: Secondary | ICD-10-CM | POA: Diagnosis not present

## 2019-09-18 ENCOUNTER — Encounter: Payer: Self-pay | Admitting: Emergency Medicine

## 2019-09-18 ENCOUNTER — Other Ambulatory Visit: Payer: Self-pay

## 2019-09-18 ENCOUNTER — Emergency Department
Admission: EM | Admit: 2019-09-18 | Discharge: 2019-09-18 | Disposition: A | Discharge status: Home / Self Care | Payer: Federal, State, Local not specified - PPO | Source: Home / Self Care | Attending: Family Medicine | Admitting: Family Medicine

## 2019-09-18 DIAGNOSIS — Q846 Other congenital malformations of nails: Secondary | ICD-10-CM

## 2019-09-18 MED ORDER — DOXYCYCLINE HYCLATE 100 MG PO CAPS: 100.0000 mg | ORAL_CAPSULE | Freq: Two times a day (BID) | ORAL | 0 refills | Status: DC

## 2019-09-18 NOTE — ED Triage Notes (Signed)
RT ring finger red, swollen and infected, states she had her nails done 4 days ago and pain started 2 days later

## 2019-09-18 NOTE — Discharge Instructions (Addendum)
Begin warm soaks 2 to 3 times daily for about 20 minutes.  May take Ibuprofen 200mg , 4 tabs every 8 hours with food.

## 2019-09-18 NOTE — ED Provider Notes (Signed)
[facility]    CSN: [id number] Arrival date & time: 09/18/19  1033      History   Chief Complaint Chief Complaint  Patient presents with  . Hand Pain    HPI Cassidy Hopkins is a 44 y.o. female.   Patient had a manicure four days ago, and two days ago she developed pain/swelling at the base of her fourth fingernail.  There has been no drainage from the area.  The history is provided by the patient.  Hand Pain This is a new problem. The current episode started 2 days ago. The problem occurs constantly. The problem has been gradually worsening. Exacerbated by: contact. Nothing relieves the symptoms. Treatments tried: steroid cream. The treatment provided no relief.    Past Medical History:  Diagnosis Date  . Hyperlipidemia   . Hypertension   . Irregular heart rate   . Migraines   . Thyroid disease     Patient Active Problem List   Diagnosis Date Noted  . Insomnia 05/08/2019  . Right ureteral stone 11/04/2018  . Adult ADHD 12/03/2017  . Social anxiety disorder 12/03/2017  . Multiple lipomas 07/07/2016  . Generalized anxiety disorder 03/31/2016  . Hypothyroidism 01/17/2016  . Pseudotumor cerebri 01/17/2016  . Obesity (BMI 30-39.9) 01/17/2016    Past Surgical History:  Procedure Laterality Date  . BREAST SURGERY    . CESAREAN SECTION  2009, 2014, 2016  . CHOLECYSTECTOMY  1997    OB History   No obstetric history on file.      Home Medications    Prior to Admission medications   Medication Sig Start Date End Date Taking? Authorizing Provider  ibuprofen (ADVIL) 400 MG tablet Take 400 mg by mouth every 6 (six) hours as needed.   Yes [provider]  acetaZOLAMIDE (DIAMOX) 250 MG tablet Take 1 tablet (250 mg total) by mouth daily. 02/17/19   Briscoe Deutscher, DO  amphetamine-dextroamphetamine (ADDERALL) 10 MG tablet Take 1 tablet (10 mg total) by mouth 3 (three) times daily. 09/04/19   Orma Flaming, MD  cetirizine (ZYRTEC) 10 MG tablet Take by  mouth.    [provider]  clonazePAM (KLONOPIN) 0.5 MG tablet TAKE 1 TABLET BY MOUTH 2 TIMES DAILY. 09/04/19   Orma Flaming, MD  doxycycline (VIBRAMYCIN) 100 MG capsule Take 1 capsule (100 mg total) by mouth 2 (two) times daily. Take with food. 09/18/19   Kandra Nicolas, MD  hydrochlorothiazide (HYDRODIURIL) 25 MG tablet Take 1 tablet (25 mg total) by mouth daily. 06/23/19   Orma Flaming, MD  levothyroxine (SYNTHROID) 100 MCG tablet TAKE 1 TABLET (100 MCG TOTAL) BY MOUTH DAILY BEFORE BREAKFAST. LABS IN 6-8 WEEKS 08/20/19   Orma Flaming, MD  tamsulosin (FLOMAX) 0.4 MG CAPS capsule Take 1 capsule (0.4 mg total) by mouth daily. 04/10/19   Vivi Barrack, MD  venlafaxine XR (EFFEXOR-XR) 75 MG 24 hr capsule TAKE 1 CAPSULE BY MOUTH EVERY DAY WITH BREAKFAST 07/02/19   Orma Flaming, MD    Family History Family History  Problem Relation Age of Onset  . Hypertension Mother   . Hyperlipidemia Mother   . Hyperlipidemia Father   . Heart disease Father   . Hypertension Father   . Diabetes Father   . Colon cancer Maternal Grandmother   . Hyperlipidemia Maternal Grandmother     Social History Social History   Tobacco Use  . Smoking status: Never Smoker  . Smokeless tobacco: Never Used  Substance Use Topics  . Alcohol use: No  .  Drug use: No     Allergies   Penicillins and Topiramate   Review of Systems Review of Systems  Constitutional: Negative for chills, diaphoresis, fatigue and fever.  Musculoskeletal: Negative for joint swelling.  Skin: Positive for color change.     Physical Exam Triage Vital Signs ED Triage Vitals [09/18/19 1045]  Enc Vitals Group     BP 127/83     Pulse Rate 88     Resp      Temp 98.5 F (36.9 C)     Temp Source Oral     SpO2 99 %     Weight 240 lb (108.9 kg)     Height 5\' 6"  (1.676 m)     Head Circumference      Peak Flow      Pain Score 5     Pain Loc      Pain Edu?      Excl. in [place]?    No data found.  Updated Vital  Signs BP 127/83 (BP Location: Right Arm)   Pulse 88   Temp 98.5 F (36.9 C) (Oral)   Ht 5\' 6"  (1.676 m)   Wt 108.9 kg   SpO2 99%   BMI 38.74 kg/m   Visual Acuity Right Eye Distance:   Left Eye Distance:   Bilateral Distance:    Right Eye Near:   Left Eye Near:    Bilateral Near:     Physical Exam Vitals and nursing note reviewed.  Constitutional:      General: She is not in acute distress.    Appearance: She is obese.  Eyes:     Pupils: Pupils are equal, round, and reactive to light.  Cardiovascular:     Rate and Rhythm: Normal rate.  Pulmonary:     Effort: Pulmonary effort is normal.  Musculoskeletal:     Right hand: Swelling and tenderness present. No bony tenderness. Normal range of motion.       Hands:     Comments: RIght fourth finger has induration, tenderness, erythema, and swelling at base of fingernail.  No drainage from the area.    Skin:    General: Skin is warm and dry.  Neurological:     Mental Status: She is alert.      UC Treatments / Results  Labs (all labs ordered are listed, but only abnormal results are displayed) Labs Reviewed - No data to display  EKG   Radiology No results found.  Procedures Procedures (including critical care time)  Medications Ordered in UC Medications - No data to display  Initial Impression / Assessment and Plan / UC Course  I have reviewed the triage vital signs and the nursing notes.  Pertinent labs & imaging results that were available during my care of the patient were reviewed by me and considered in my medical decision making (see chart for details).    Begin doxycycline for staph coverage. Followup with Family Doctor if not improved in about 5 days   Final Clinical Impressions(s) / UC Diagnoses   Final diagnoses:  Eponychia     Discharge Instructions     Begin warm soaks 2 to 3 times daily for about 20 minutes.  May take Ibuprofen 200mg , 4 tabs every 8 hours with food.     ED  Prescriptions    Medication Sig Dispense Auth. Provider   doxycycline (VIBRAMYCIN) 100 MG capsule Take 1 capsule (100 mg total) by mouth 2 (two) times daily. Take with food. 20 capsule  Kandra Nicolas, MD        Kandra Nicolas, MD 09/18/19 [phone]

## 2019-09-29 ENCOUNTER — Other Ambulatory Visit: Payer: Self-pay | Admitting: Family Medicine

## 2019-09-29 DIAGNOSIS — F411 Generalized anxiety disorder: Secondary | ICD-10-CM

## 2019-09-29 NOTE — Telephone Encounter (Signed)
LAST APPOINTMENT DATE: 08/08/2019   NEXT APPOINTMENT DATE: 02/13/2020   Rx Klonopin LAST REFILL: 09/04/2019  QTY: 60 0Rf   Rx Adderall  LAST REFILL: 09/04/2019  QTY: 90 0Rf

## 2019-09-30 ENCOUNTER — Other Ambulatory Visit: Payer: Self-pay | Admitting: Family Medicine

## 2019-09-30 DIAGNOSIS — M797 Fibromyalgia: Secondary | ICD-10-CM

## 2019-09-30 DIAGNOSIS — N951 Menopausal and female climacteric states: Secondary | ICD-10-CM

## 2019-10-01 ENCOUNTER — Other Ambulatory Visit: Payer: Self-pay | Admitting: Family Medicine

## 2019-10-01 DIAGNOSIS — F411 Generalized anxiety disorder: Secondary | ICD-10-CM

## 2019-10-01 MED ORDER — CLONAZEPAM 0.5 MG PO TABS: 0.5000 mg | ORAL_TABLET | Freq: Two times a day (BID) | ORAL | 0 refills | Status: DC

## 2019-10-01 MED ORDER — AMPHETAMINE-DEXTROAMPHETAMINE 10 MG PO TABS: 10.0000 mg | ORAL_TABLET | Freq: Three times a day (TID) | ORAL | 0 refills | Status: DC

## 2019-10-01 NOTE — Telephone Encounter (Signed)
Refilled by Provider

## 2019-10-01 NOTE — Telephone Encounter (Signed)
Last refill: 09/04/19 #60, 0 Last OV: 08/08/19 dx. Hypothyroidism, ADHD

## 2019-10-04 ENCOUNTER — Encounter: Payer: Self-pay | Admitting: Emergency Medicine

## 2019-10-04 ENCOUNTER — Emergency Department (INDEPENDENT_AMBULATORY_CARE_PROVIDER_SITE_OTHER): Payer: Federal, State, Local not specified - PPO

## 2019-10-04 ENCOUNTER — Emergency Department
Admission: EM | Admit: 2019-10-04 | Discharge: 2019-10-04 | Disposition: A | Discharge status: Home / Self Care | Payer: Federal, State, Local not specified - PPO | Source: Home / Self Care

## 2019-10-04 ENCOUNTER — Other Ambulatory Visit: Payer: Self-pay

## 2019-10-04 DIAGNOSIS — F4312 Post-traumatic stress disorder, chronic: Secondary | ICD-10-CM | POA: Diagnosis not present

## 2019-10-04 DIAGNOSIS — S99921A Unspecified injury of right foot, initial encounter: Secondary | ICD-10-CM | POA: Diagnosis not present

## 2019-10-04 DIAGNOSIS — M79671 Pain in right foot: Secondary | ICD-10-CM | POA: Diagnosis not present

## 2019-10-04 DIAGNOSIS — S9031XA Contusion of right foot, initial encounter: Secondary | ICD-10-CM | POA: Diagnosis not present

## 2019-10-04 DIAGNOSIS — W2201XA Walked into wall, initial encounter: Secondary | ICD-10-CM | POA: Diagnosis not present

## 2019-10-04 NOTE — ED Provider Notes (Signed)
[name]    CSN: [id number] Arrival date & time: 10/04/19  1414      History   Chief Complaint Chief Complaint  Patient presents with  . Foot Injury    right    HPI Cassidy Hopkins is a 44 y.o. female.   HPI  Cassidy Hopkins is a 44 y.o. female presenting to UC with c/o Right foot pain, swelling and mild bruising after accidentally kicking a wall while carrying a laundry basket at home yesterday. She re-injured the same foot in [employer] just PTA. She was trying to get out of another customer's way when she stubbed her toes, causing the front of her flip-flop and toes to fold back.  Pain is aching and sore, 5/10. Worse with ambulating and weight bearing. She has not tried anything for pain PTA.    Past Medical History:  Diagnosis Date  . Hyperlipidemia   . Hypertension   . Irregular heart rate   . Migraines   . Thyroid disease     Patient Active Problem List   Diagnosis Date Noted  . Insomnia 05/08/2019  . Right ureteral stone 11/04/2018  . Adult ADHD 12/03/2017  . Social anxiety disorder 12/03/2017  . Multiple lipomas 07/07/2016  . Generalized anxiety disorder 03/31/2016  . Hypothyroidism 01/17/2016  . Pseudotumor cerebri 01/17/2016  . Obesity (BMI 30-39.9) 01/17/2016    Past Surgical History:  Procedure Laterality Date  . BREAST SURGERY    . CESAREAN SECTION  2009, 2014, 2016  . CHOLECYSTECTOMY  1997    OB History   No obstetric history on file.      Home Medications    Prior to Admission medications   Medication Sig Start Date End Date Taking? Authorizing Provider  acetaZOLAMIDE (DIAMOX) 250 MG tablet Take 1 tablet (250 mg total) by mouth daily. 02/17/19  Yes Briscoe Deutscher, DO  amphetamine-dextroamphetamine (ADDERALL) 10 MG tablet Take 1 tablet (10 mg total) by mouth 3 (three) times daily. 10/01/19  Yes Orma Flaming, MD  cetirizine (ZYRTEC) 10 MG tablet Take by mouth.   Yes [provider]  clonazePAM (KLONOPIN) 0.5 MG tablet Take 1  tablet (0.5 mg total) by mouth 2 (two) times daily. 10/01/19  Yes Orma Flaming, MD  hydrochlorothiazide (HYDRODIURIL) 25 MG tablet Take 1 tablet (25 mg total) by mouth daily. 06/23/19  Yes Orma Flaming, MD  levothyroxine (SYNTHROID) 100 MCG tablet TAKE 1 TABLET (100 MCG TOTAL) BY MOUTH DAILY BEFORE BREAKFAST. LABS IN 6-8 WEEKS 08/20/19  Yes Orma Flaming, MD  venlafaxine XR (EFFEXOR-XR) 75 MG 24 hr capsule TAKE 1 CAPSULE BY MOUTH EVERY DAY WITH BREAKFAST 10/01/19  Yes Orma Flaming, MD  doxycycline (VIBRAMYCIN) 100 MG capsule Take 1 capsule (100 mg total) by mouth 2 (two) times daily. Take with food. 09/18/19   Kandra Nicolas, MD  ibuprofen (ADVIL) 400 MG tablet Take 400 mg by mouth every 6 (six) hours as needed.    [provider]  tamsulosin (FLOMAX) 0.4 MG CAPS capsule Take 1 capsule (0.4 mg total) by mouth daily. 04/10/19   Vivi Barrack, MD    Family History Family History  Problem Relation Age of Onset  . Hypertension Mother   . Hyperlipidemia Mother   . Hyperlipidemia Father   . Heart disease Father   . Hypertension Father   . Diabetes Father   . Colon cancer Maternal Grandmother   . Hyperlipidemia Maternal Grandmother     Social History Social History   Tobacco Use  .  Smoking status: Never Smoker  . Smokeless tobacco: Never Used  Substance Use Topics  . Alcohol use: No  . Drug use: No     Allergies   Penicillins and Topiramate   Review of Systems Review of Systems  Musculoskeletal: Positive for arthralgias and joint swelling.  Skin: Positive for color change. Negative for wound.     Physical Exam Triage Vital Signs ED Triage Vitals  Enc Vitals Group     BP 10/04/19 1425 118/80     Pulse Rate 10/04/19 1425 (!) 107     Resp 10/04/19 1425 16     Temp 10/04/19 1425 98.8 F (37.1 C)     Temp Source 10/04/19 1425 Oral     SpO2 10/04/19 1425 100 %     Weight --      Height --      Head Circumference --      Peak Flow --      Pain Score  10/04/19 1430 5     Pain Loc --      Pain Edu? --      Excl. in [place]? --    No data found.  Updated Vital Signs BP 118/80 (BP Location: Right Arm)   Pulse (!) 107   Temp 98.8 F (37.1 C) (Oral)   Resp 16   LMP 09/13/2019 (Approximate)   SpO2 100%   Visual Acuity Right Eye Distance:   Left Eye Distance:   Bilateral Distance:    Right Eye Near:   Left Eye Near:    Bilateral Near:     Physical Exam Vitals and nursing note reviewed.  Constitutional:      Appearance: Normal appearance. She is well-developed.  HENT:     Head: Normocephalic and atraumatic.  Cardiovascular:     Rate and Rhythm: Normal rate and regular rhythm.     Pulses:          Dorsalis pedis pulses are 2+ on the right side.       Posterior tibial pulses are 2+ on the right side.  Pulmonary:     Effort: Pulmonary effort is normal.  Musculoskeletal:        General: Swelling and tenderness present.     Cervical back: Normal range of motion.     Comments: Right foot: mild to moderate edema over mid to distal dorsum of foot. Tenderness to 2nd, 3rd and 4th toes. Slight decreased ROM due to pain in toes. No obvious deformity. No tenderness to great toe or little toe. No tenderness to ankle, full ROM.  Skin:    General: Skin is warm and dry.     Capillary Refill: Capillary refill takes less than 2 seconds.     Findings: Bruising present.     Comments: Right foot: faint ecchymosis dorsum at base of 3rd and 4th toes. Skin in tact.   Neurological:     Mental Status: She is alert and oriented to person, place, and time.     Sensory: No sensory deficit.  Psychiatric:        Behavior: Behavior normal.      UC Treatments / Results  Labs (all labs ordered are listed, but only abnormal results are displayed) Labs Reviewed - No data to display  EKG   Radiology DG Foot Complete Right  Result Date: 10/04/2019 CLINICAL DATA:  44 year-old female hit her RIGHT foot last night on a wall. C/O pain to her 2nd, 3rd,  and 4th toes and lateral metatarsals. No  previous injury or sx. EXAM: RIGHT FOOT COMPLETE - 3+ VIEW COMPARISON:  None. FINDINGS: No fracture or bone lesion. Joints are normally spaced and aligned Mild forefoot soft tissue swelling. IMPRESSION: No fracture or dislocation Electronically Signed   By: Lajean Manes M.D.   On: 10/04/2019 14:59    Procedures Procedures (including critical care time)  Medications Ordered in UC Medications - No data to display  Initial Impression / Assessment and Plan / UC Course  I have reviewed the triage vital signs and the nursing notes.  Pertinent labs & imaging results that were available during my care of the patient were reviewed by me and considered in my medical decision making (see chart for details).    Reviewed imaging with pt Encouraged conservative tx at home Post-op shoe and crutches provided for comfort Encouraged f/u with Sports Medicine next week for further evaluation and treatment if not improving AVS provided  Final Clinical Impressions(s) / UC Diagnoses   Final diagnoses:  Contusion of right foot, initial encounter  Right foot pain     Discharge Instructions      You may take 500mg  acetaminophen every 4-6 hours or in combination with ibuprofen 400-600mg  every 6-8 hours as needed for pain and inflammation.      ED Prescriptions    None     PDMP not reviewed this encounter.   Noe Gens, [place] 10/05/19 [phone]

## 2019-10-04 NOTE — Discharge Instructions (Signed)
  You may take 500mg acetaminophen every 4-6 hours or in combination with ibuprofen 400-600mg every 6-8 hours as needed for pain and inflammation.  

## 2019-10-04 NOTE — ED Triage Notes (Signed)
Covid # 2 vaccine 09/22/19 ([place]) Hit R foot last night on a wall (she thinks) C/O pain to top 3 middle toes Re-injured today at [employer] while walking  When her shoe slipped off and her toes rolled No OTC meds

## 2019-10-18 DIAGNOSIS — F4312 Post-traumatic stress disorder, chronic: Secondary | ICD-10-CM | POA: Diagnosis not present

## 2019-10-21 DIAGNOSIS — Z1231 Encounter for screening mammogram for malignant neoplasm of breast: Secondary | ICD-10-CM | POA: Diagnosis not present

## 2019-10-26 ENCOUNTER — Other Ambulatory Visit: Payer: Self-pay | Admitting: Family Medicine

## 2019-10-26 DIAGNOSIS — F411 Generalized anxiety disorder: Secondary | ICD-10-CM

## 2019-10-28 ENCOUNTER — Other Ambulatory Visit: Payer: Self-pay | Admitting: Family Medicine

## 2019-10-28 DIAGNOSIS — F411 Generalized anxiety disorder: Secondary | ICD-10-CM

## 2019-10-28 NOTE — Telephone Encounter (Signed)
Rx request 

## 2019-10-29 ENCOUNTER — Other Ambulatory Visit: Payer: Self-pay | Admitting: Family Medicine

## 2019-10-29 DIAGNOSIS — F411 Generalized anxiety disorder: Secondary | ICD-10-CM

## 2019-10-29 MED ORDER — AMPHETAMINE-DEXTROAMPHETAMINE 10 MG PO TABS: 10.0000 mg | ORAL_TABLET | Freq: Three times a day (TID) | ORAL | 0 refills | Status: DC

## 2019-10-29 NOTE — Telephone Encounter (Signed)
Last refill: 10/01/19 #60,0 Last OV: 08/08/19 dx. Social anxiety disorder

## 2019-10-29 NOTE — Telephone Encounter (Signed)
Rx Request 

## 2019-10-29 NOTE — Telephone Encounter (Signed)
  LAST APPOINTMENT DATE: 08/08/19  NEXT APPOINTMENT DATE:@6 /12/2019  MEDICATION:clonazePAM (KLONOPIN) 0.5 MG tablet  PHARMACY:[id number] - [place], [place] - [address]  **Let patient know to contact pharmacy at the end of the day to make sure medication is ready. **  ** Please notify patient to allow 48-72 hours to process**  **Encourage patient to contact the pharmacy for refills or they can request refills through [facility]**  CLINICAL FILLS OUT ALL BELOW:   LAST REFILL:  QTY:  REFILL DATE:    OTHER COMMENTS:    Okay for refill?  Please advise

## 2019-11-24 ENCOUNTER — Other Ambulatory Visit: Payer: Self-pay | Admitting: Family Medicine

## 2019-11-24 DIAGNOSIS — F411 Generalized anxiety disorder: Secondary | ICD-10-CM

## 2019-11-25 ENCOUNTER — Other Ambulatory Visit: Payer: Self-pay | Admitting: Family Medicine

## 2019-11-25 DIAGNOSIS — F411 Generalized anxiety disorder: Secondary | ICD-10-CM

## 2019-11-26 ENCOUNTER — Other Ambulatory Visit: Payer: Self-pay | Admitting: Family Medicine

## 2019-11-26 DIAGNOSIS — F411 Generalized anxiety disorder: Secondary | ICD-10-CM

## 2019-11-26 MED ORDER — AMPHETAMINE-DEXTROAMPHETAMINE 10 MG PO TABS: 10.0000 mg | ORAL_TABLET | Freq: Three times a day (TID) | ORAL | 0 refills | Status: DC

## 2019-11-26 MED ORDER — CLONAZEPAM 0.5 MG PO TABS: 0.5000 mg | ORAL_TABLET | Freq: Two times a day (BID) | ORAL | 0 refills | Status: DC

## 2019-11-26 NOTE — Telephone Encounter (Signed)
Clonopin [place] Database Verified LR: 10-29-2019 Qty: 51 Last office visit: 08-08-2019 Upcoming appointment: 02-13-2020  Adderall  [place] Database Verified LR: 10-29-2019 Qty: 90 Last office visit: 08-08-2019 Upcoming appointment: 02-13-2020

## 2019-11-26 NOTE — Telephone Encounter (Signed)
Last refill: 10/29/19 #60, 0 Last OV: 08/08/19 dx. Hypothyroidism

## 2019-12-22 ENCOUNTER — Other Ambulatory Visit: Payer: Self-pay | Admitting: Family Medicine

## 2019-12-22 DIAGNOSIS — F411 Generalized anxiety disorder: Secondary | ICD-10-CM

## 2019-12-22 MED ORDER — CLONAZEPAM 0.5 MG PO TABS: 0.5000 mg | ORAL_TABLET | Freq: Two times a day (BID) | ORAL | 0 refills | Status: DC

## 2019-12-22 MED ORDER — AMPHETAMINE-DEXTROAMPHETAMINE 10 MG PO TABS: 10.0000 mg | ORAL_TABLET | Freq: Three times a day (TID) | ORAL | 0 refills | Status: DC

## 2019-12-22 NOTE — Telephone Encounter (Signed)
Adderall LR: 11-26-2019 Qty: 17 w 0 Last office visit: 08-08-2019 Upcoming appointment: 02-13-2020   Clonazepam LR: 11-26-2019 Qty: 41 w 0 Last office visit: 08-08-2019 Upcoming appointment: 02-13-2020

## 2019-12-25 DIAGNOSIS — F4312 Post-traumatic stress disorder, chronic: Secondary | ICD-10-CM | POA: Diagnosis not present

## 2019-12-26 ENCOUNTER — Other Ambulatory Visit: Payer: Self-pay | Admitting: Family Medicine

## 2020-01-03 DIAGNOSIS — F4312 Post-traumatic stress disorder, chronic: Secondary | ICD-10-CM | POA: Diagnosis not present

## 2020-01-19 ENCOUNTER — Other Ambulatory Visit: Payer: Self-pay | Admitting: Family Medicine

## 2020-01-19 DIAGNOSIS — F411 Generalized anxiety disorder: Secondary | ICD-10-CM

## 2020-01-20 ENCOUNTER — Other Ambulatory Visit: Payer: Self-pay | Admitting: Family Medicine

## 2020-01-20 DIAGNOSIS — F411 Generalized anxiety disorder: Secondary | ICD-10-CM

## 2020-01-20 MED ORDER — AMPHETAMINE-DEXTROAMPHETAMINE 10 MG PO TABS: 10.0000 mg | ORAL_TABLET | Freq: Three times a day (TID) | ORAL | 0 refills | Status: DC

## 2020-01-20 MED ORDER — CLONAZEPAM 0.5 MG PO TABS: 0.5000 mg | ORAL_TABLET | Freq: Two times a day (BID) | ORAL | 0 refills | Status: DC

## 2020-02-13 ENCOUNTER — Encounter: Payer: Federal, State, Local not specified - PPO | Admitting: Family Medicine

## 2020-02-13 ENCOUNTER — Other Ambulatory Visit: Payer: Self-pay | Admitting: Family Medicine

## 2020-02-13 DIAGNOSIS — F411 Generalized anxiety disorder: Secondary | ICD-10-CM

## 2020-02-16 ENCOUNTER — Other Ambulatory Visit: Payer: Self-pay | Admitting: Family Medicine

## 2020-02-16 DIAGNOSIS — F411 Generalized anxiety disorder: Secondary | ICD-10-CM

## 2020-02-16 MED ORDER — AMPHETAMINE-DEXTROAMPHETAMINE 10 MG PO TABS: 10.0000 mg | ORAL_TABLET | Freq: Three times a day (TID) | ORAL | 0 refills | Status: DC

## 2020-02-16 MED ORDER — CLONAZEPAM 0.5 MG PO TABS: 0.5000 mg | ORAL_TABLET | Freq: Two times a day (BID) | ORAL | 0 refills | Status: DC

## 2020-02-16 NOTE — Telephone Encounter (Signed)
Pt requesting Clonazepam 0.5 mg tab LOV: 10/04/2019 No future visits scheduled Approve?

## 2020-03-09 ENCOUNTER — Other Ambulatory Visit: Payer: Self-pay | Admitting: Family Medicine

## 2020-03-11 DIAGNOSIS — Z111 Encounter for screening for respiratory tuberculosis: Secondary | ICD-10-CM | POA: Diagnosis not present

## 2020-03-12 ENCOUNTER — Other Ambulatory Visit: Payer: Self-pay | Admitting: Family Medicine

## 2020-03-12 DIAGNOSIS — F411 Generalized anxiety disorder: Secondary | ICD-10-CM

## 2020-03-12 NOTE — Telephone Encounter (Signed)
Last OV 08/08/2019  Pt requesting refills

## 2020-03-13 DIAGNOSIS — Z111 Encounter for screening for respiratory tuberculosis: Secondary | ICD-10-CM | POA: Diagnosis not present

## 2020-03-14 ENCOUNTER — Other Ambulatory Visit: Payer: Self-pay | Admitting: Family Medicine

## 2020-03-14 DIAGNOSIS — F411 Generalized anxiety disorder: Secondary | ICD-10-CM

## 2020-03-15 ENCOUNTER — Other Ambulatory Visit: Payer: Self-pay | Admitting: Family Medicine

## 2020-03-15 ENCOUNTER — Telehealth: Payer: Self-pay | Admitting: Family Medicine

## 2020-03-15 MED ORDER — AMPHETAMINE-DEXTROAMPHETAMINE 10 MG PO TABS
10.0000 mg | ORAL_TABLET | Freq: Three times a day (TID) | ORAL | 0 refills | Status: DC
Start: 2020-03-15 — End: 2020-04-09

## 2020-03-15 NOTE — Telephone Encounter (Signed)
Please let her know that she is way overdue for routine f/u for adhd medication. I think she had appointment in September and we had to move due to me, but make sure she has this scheduled.   Thanks,  Dr .Rogers Blocker

## 2020-03-15 NOTE — Telephone Encounter (Signed)
°  LAST APPOINTMENT DATE: 03/14/2020   NEXT APPOINTMENT DATE:@Visit  date not found  MEDICATION: acetaZOLAMIDE (DIAMOX) 250 MG tablet // clonazePAM (KLONOPIN) 0.5 MG tablet // amphetamine-dextroamphetamine (ADDERALL) 10 MG tablet   PHARMACY:[id number] - [place], [place] - [address]

## 2020-03-15 NOTE — Telephone Encounter (Signed)
Please schedule follow for ADHD.   Thank You

## 2020-03-16 ENCOUNTER — Telehealth: Payer: Self-pay

## 2020-03-16 NOTE — Telephone Encounter (Signed)
Patient is going to call back and schedule.

## 2020-03-16 NOTE — Telephone Encounter (Signed)
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 She states she thinks she has had immunizations in both [place] and [place]

## 2020-03-17 NOTE — Telephone Encounter (Signed)
Called and spoke to Cassidy Hopkins. I informed her that her Full immunization record was not uploaded onto Epic and the only vaccine shown was a TDAP and a PPD Placement and flu shot that needs to be reconciled from an outside source. I have also checked [facility] and informed her of the Covid Vaccines that were entered in, but no other vaccinations. Cassidy Hopkins thanked me for my call and stated that she will reach out to the hospital that she had her children in and see if they have a more detailed list of her immunizations. I recommended she also contact her office in [place] if she had any vaccines put in there. Cassidy Hopkins verbalized understanding and had no further questions.

## 2020-03-17 NOTE — Telephone Encounter (Signed)
Patient is calling in asking for an update.  

## 2020-03-20 DIAGNOSIS — Z111 Encounter for screening for respiratory tuberculosis: Secondary | ICD-10-CM | POA: Diagnosis not present

## 2020-03-22 ENCOUNTER — Encounter: Payer: Self-pay | Admitting: Family Medicine

## 2020-03-22 DIAGNOSIS — Z111 Encounter for screening for respiratory tuberculosis: Secondary | ICD-10-CM | POA: Diagnosis not present

## 2020-04-05 ENCOUNTER — Other Ambulatory Visit: Payer: Self-pay | Admitting: Family Medicine

## 2020-04-05 DIAGNOSIS — M797 Fibromyalgia: Secondary | ICD-10-CM

## 2020-04-05 DIAGNOSIS — N951 Menopausal and female climacteric states: Secondary | ICD-10-CM

## 2020-04-09 ENCOUNTER — Other Ambulatory Visit: Payer: Self-pay | Admitting: Family Medicine

## 2020-04-09 DIAGNOSIS — F411 Generalized anxiety disorder: Secondary | ICD-10-CM

## 2020-04-09 MED ORDER — CLONAZEPAM 0.5 MG PO TABS: 0.5000 mg | ORAL_TABLET | Freq: Two times a day (BID) | ORAL | 0 refills | Status: DC

## 2020-04-09 MED ORDER — AMPHETAMINE-DEXTROAMPHETAMINE 10 MG PO TABS
10.0000 mg | ORAL_TABLET | Freq: Three times a day (TID) | ORAL | 0 refills | Status: DC
Start: 2020-04-09 — End: 2020-04-22

## 2020-04-09 NOTE — Telephone Encounter (Signed)
LAST APPOINTMENT DATE: 08/08/2019   NEXT APPOINTMENT DATE: 04/21/2020

## 2020-04-12 ENCOUNTER — Telehealth: Payer: Self-pay

## 2020-04-12 NOTE — Telephone Encounter (Signed)
Patient states she picked up her RX for her adderal Yesterday and pharmacy gave her a partial fill and only gave her a quantity or 80 instead of the regular 90 and  Patient states she will need those ten towards the end of the month and we will need to write a new prescription for those remaining 10

## 2020-04-13 NOTE — Telephone Encounter (Signed)
Patient scheduled for 12/2.

## 2020-04-13 NOTE — Telephone Encounter (Signed)
I spoke with the pharmacy to confirm Quantity, 26 day supply was given because, they were low in stock.

## 2020-04-13 NOTE — Telephone Encounter (Signed)
Melitta please call pharmacy and see why they only did 80. I wrote for 90. She may have requested too soon. Also she is WAY overdue for an appointment and needs to be seen in next month.  Thanks,  Dr. Rogers Blocker

## 2020-04-21 ENCOUNTER — Ambulatory Visit: Payer: Federal, State, Local not specified - PPO | Admitting: Family Medicine

## 2020-04-22 ENCOUNTER — Encounter: Payer: Self-pay | Admitting: Family Medicine

## 2020-04-22 ENCOUNTER — Other Ambulatory Visit: Payer: Self-pay

## 2020-04-22 ENCOUNTER — Ambulatory Visit: Payer: Federal, State, Local not specified - PPO | Admitting: Family Medicine

## 2020-04-22 VITALS — BP 121/75 | HR 85 | Temp 98.3°F | Ht 66.0 in | Wt 241.4 lb

## 2020-04-22 DIAGNOSIS — R5383 Other fatigue: Secondary | ICD-10-CM

## 2020-04-22 DIAGNOSIS — F909 Attention-deficit hyperactivity disorder, unspecified type: Secondary | ICD-10-CM

## 2020-04-22 DIAGNOSIS — F411 Generalized anxiety disorder: Secondary | ICD-10-CM | POA: Diagnosis not present

## 2020-04-22 MED ORDER — AMPHETAMINE-DEXTROAMPHETAMINE 10 MG PO TABS
10.0000 mg | ORAL_TABLET | Freq: Three times a day (TID) | ORAL | 0 refills | Status: DC
Start: 2020-04-22 — End: 2020-05-07

## 2020-04-22 NOTE — Progress Notes (Signed)
Patient: Cassidy Hopkins MRN: [id number] DOB: 1976-04-17 PCP: Orma Flaming, MD     Subjective:  Chief Complaint  Patient presents with   ADHD   Anxiety   fatigue, headache,congestion    HPI: The patient is a 44 y.o. female who presents today for medication check for adhd, anxiety and ? covid symptoms. She complains today of fatigue, nausea, vomiting, migraine, and dizziness.   adhd Has testing scheduled for January of 2022. Need official testing to continue pxing medication. Currently on adderall 10mg  TID. I wanted to get her tested. She is here for follow of her controlled medication. She is doing well on current dosage.   Extreme fatigue/vomiting/headaches Symptoms started 2 days ago. She is in a CNA program. She has a frontal headache, mild congestion and had 2 episodes of vomiting. She is vaccinated for covid. She has no cough. She is just very tired. She has no known sick contacts. She was around her in laws for thanksgiving. They just got tested for covid and she doesn't know their results.   GAD She is currently on effexor 75mg  daily and klonopin BID. She does have panic attacks. She knows why her anxiety gets worse and knows her triggers.   Review of Systems  Constitutional: Positive for fatigue. Negative for chills and fever.  HENT: Positive for congestion. Negative for ear pain, sinus pressure and sinus pain.   Respiratory: Negative for cough and shortness of breath.   Cardiovascular: Negative for chest pain, palpitations and leg swelling.  Gastrointestinal: Positive for nausea and vomiting. Negative for abdominal pain.  Neurological: Positive for dizziness and headaches. Negative for light-headedness.  Psychiatric/Behavioral: Negative for behavioral problems, confusion, dysphoric mood and sleep disturbance. The patient is not nervous/anxious.     Allergies Patient is allergic to penicillins and topiramate.  Past Medical History Patient  has a past medical history  of Hyperlipidemia, Hypertension, Irregular heart rate, Migraines, and Thyroid disease.  Surgical History Patient  has a past surgical history that includes Cholecystectomy (1997); Cesarean section (2009, 2014, 2016); and Breast surgery.  Family History Pateint's family history includes Colon cancer in her maternal grandmother; Diabetes in her father; Heart disease in her father; Hyperlipidemia in her father, maternal grandmother, and mother; Hypertension in her father and mother.  Social History Patient  reports that she has never smoked. She has never used smokeless tobacco. She reports that she does not drink alcohol and does not use drugs.    Objective: Vitals:   04/22/20 1104  BP: 121/75  Pulse: 85  Temp: 98.3 F (36.8 C)  TempSrc: Temporal  SpO2: 98%  Weight: 241 lb 6.4 oz (109.5 kg)  Height: 5\' 6"  (1.676 m)    Body mass index is 38.96 kg/m.  Physical Exam Vitals reviewed.  Constitutional:      General: She is not in acute distress.    Appearance: Normal appearance. She is obese. She is not ill-appearing.  HENT:     Head: Normocephalic and atraumatic.     Comments: No ttp over sinuses.     Right Ear: Tympanic membrane, ear canal and external ear normal.     Left Ear: Tympanic membrane, ear canal and external ear normal.     Nose: Congestion present.     Mouth/Throat:     Mouth: Mucous membranes are moist.  Eyes:     Extraocular Movements: Extraocular movements intact.     Conjunctiva/sclera: Conjunctivae normal.     Pupils: Pupils are equal, round, and reactive to light.  Cardiovascular:     Rate and Rhythm: Normal rate and regular rhythm.     Heart sounds: Normal heart sounds.  Pulmonary:     Effort: Pulmonary effort is normal. No respiratory distress.     Breath sounds: Normal breath sounds. No wheezing or rales.  Abdominal:     General: Bowel sounds are normal.     Palpations: Abdomen is soft.  Musculoskeletal:     Cervical back: Normal range of motion  and neck supple.  Lymphadenopathy:     Cervical: No cervical adenopathy.  Skin:    Capillary Refill: Capillary refill takes less than 2 seconds.  Neurological:     General: No focal deficit present.     Mental Status: She is alert and oriented to person, place, and time.  Psychiatric:        Mood and Affect: Mood normal.        Behavior: Behavior normal.       GAD 7 : Generalized Anxiety Score 04/22/2020 08/08/2019 05/08/2019 06/14/2018  Nervous, Anxious, on Edge 1 1 1 1   Control/stop worrying 1 0 0 1  Worry too much - different things 3 0 0 2  Trouble relaxing 1 0 1 2  Restless 0 0 0 1  Easily annoyed or irritable 1 0 1 2  Afraid - awful might happen 0 0 1 2  Total GAD 7 Score 7 1 4 11   Anxiety Difficulty Not difficult at all Not difficult at all Not difficult at all Somewhat difficult     Assessment/plan: 1. Other fatigue Congestion, extreme fatigue and headache with GI. covid testing her. Quarantine until results back. She is vaccinated.  - SARS CoV2 Serology(COVID19) AB(IgG,IgM),Immunoassay; Future  2. Adult ADHD pmp website reviewed. Giving her 10 more pills as they only gave her 80 pills. She is getting officially tested beginning of next year. F/u in 3 months for routine appointment. Will need yearly uds then.   3. Generalized anxiety disorder GAD7 score is mild, but significantly worse than last given. She has a lot of triggers during this time of year and knows this. Overall feels like her medication is doing well. Encouraged exercise. F/u in 6 months.      This visit occurred during the SARS-CoV-2 public health emergency.  Safety protocols were in place, including screening questions prior to the visit, additional usage of staff PPE, and extensive cleaning of exam room while observing appropriate contact time as indicated for disinfecting solutions.    Return in about 3 months (around 07/21/2020) for annual/fasting labs/adhd .    Orma Flaming, MD [place]   04/22/2020

## 2020-04-23 ENCOUNTER — Other Ambulatory Visit: Payer: Self-pay | Admitting: Family Medicine

## 2020-04-23 DIAGNOSIS — R5383 Other fatigue: Secondary | ICD-10-CM | POA: Diagnosis not present

## 2020-04-23 NOTE — Addendum Note (Signed)
Addended by: Jerrel Ivory D on: 04/23/2020 01:43 PM   Modules accepted: Orders

## 2020-04-25 LAB — SARS-COV-2, NAA 2 DAY TAT

## 2020-04-25 LAB — NOVEL CORONAVIRUS, NAA: SARS-CoV-2, NAA: NOT DETECTED

## 2020-05-04 ENCOUNTER — Telehealth: Payer: Self-pay

## 2020-05-04 NOTE — Telephone Encounter (Signed)
Pt was contacted and notified that Adderall has been approved. Approval letter will be sent to pt.

## 2020-05-04 NOTE — Telephone Encounter (Signed)
Pt following up

## 2020-05-04 NOTE — Telephone Encounter (Signed)
Lvm for pt to call the office back. 

## 2020-05-04 NOTE — Telephone Encounter (Signed)
Adderall has been approved per Cover My Meds:  [place]: [id number] - [place] Case ID: [id number] - Rx #: [id number]  Pt was contacted and notified of approval. Pt will receive approval letter in the mail.

## 2020-05-04 NOTE — Telephone Encounter (Signed)
Pt called stating her pharmacy told her insurance is requiring prior auth on the Adderall 10 MG that Dr. Rogers Blocker prescribed. Please advise.

## 2020-05-06 ENCOUNTER — Other Ambulatory Visit: Payer: Self-pay | Admitting: Family Medicine

## 2020-05-06 DIAGNOSIS — F411 Generalized anxiety disorder: Secondary | ICD-10-CM

## 2020-05-07 ENCOUNTER — Other Ambulatory Visit: Payer: Self-pay | Admitting: Family Medicine

## 2020-05-07 DIAGNOSIS — F411 Generalized anxiety disorder: Secondary | ICD-10-CM

## 2020-05-07 MED ORDER — AMPHETAMINE-DEXTROAMPHETAMINE 10 MG PO TABS: 10.0000 mg | ORAL_TABLET | Freq: Three times a day (TID) | ORAL | 0 refills | Status: DC

## 2020-05-07 MED ORDER — CLONAZEPAM 0.5 MG PO TABS: 0.5000 mg | ORAL_TABLET | Freq: Two times a day (BID) | ORAL | 0 refills | Status: DC

## 2020-05-07 NOTE — Telephone Encounter (Signed)
LAST APPOINTMENT DATE: 04/22/2020   NEXT APPOINTMENT DATE: 07/22/2020

## 2020-06-02 ENCOUNTER — Other Ambulatory Visit: Payer: Self-pay | Admitting: Family Medicine

## 2020-06-02 DIAGNOSIS — F411 Generalized anxiety disorder: Secondary | ICD-10-CM | POA: Diagnosis not present

## 2020-06-02 DIAGNOSIS — F902 Attention-deficit hyperactivity disorder, combined type: Secondary | ICD-10-CM | POA: Diagnosis not present

## 2020-06-02 DIAGNOSIS — F4011 Social phobia, generalized: Secondary | ICD-10-CM | POA: Diagnosis not present

## 2020-06-02 NOTE — Telephone Encounter (Signed)
Pt requesting medications: Clonazepam 0.5 mg tab/Adderall 10 mg LOV: 04/22/2020 07/22/2020 Last refill 05/07/2020  Please advise.

## 2020-06-03 ENCOUNTER — Other Ambulatory Visit: Payer: Self-pay | Admitting: Family Medicine

## 2020-06-03 MED ORDER — AMPHETAMINE-DEXTROAMPHETAMINE 10 MG PO TABS: 10.0000 mg | ORAL_TABLET | Freq: Three times a day (TID) | ORAL | 0 refills | Status: DC

## 2020-06-03 MED ORDER — CLONAZEPAM 0.5 MG PO TABS: 0.5000 mg | ORAL_TABLET | Freq: Two times a day (BID) | ORAL | 0 refills | Status: DC

## 2020-06-29 ENCOUNTER — Other Ambulatory Visit: Payer: Self-pay | Admitting: Family Medicine

## 2020-06-29 MED ORDER — AMPHETAMINE-DEXTROAMPHETAMINE 10 MG PO TABS: 10.0000 mg | ORAL_TABLET | Freq: Three times a day (TID) | ORAL | 0 refills | Status: DC

## 2020-06-29 NOTE — Telephone Encounter (Signed)
LAST APPOINTMENT DATE: 12/022021   NEXT APPOINTMENT DATE: 07/22/2020    LAST REFILL: 06/03/2020  QTY: 90

## 2020-06-30 ENCOUNTER — Other Ambulatory Visit: Payer: Self-pay | Admitting: Family Medicine

## 2020-06-30 DIAGNOSIS — F411 Generalized anxiety disorder: Secondary | ICD-10-CM

## 2020-07-01 ENCOUNTER — Other Ambulatory Visit: Payer: Self-pay | Admitting: Family Medicine

## 2020-07-01 DIAGNOSIS — F411 Generalized anxiety disorder: Secondary | ICD-10-CM

## 2020-07-05 DIAGNOSIS — Z23 Encounter for immunization: Secondary | ICD-10-CM | POA: Diagnosis not present

## 2020-07-20 DIAGNOSIS — F902 Attention-deficit hyperactivity disorder, combined type: Secondary | ICD-10-CM | POA: Diagnosis not present

## 2020-07-20 DIAGNOSIS — F411 Generalized anxiety disorder: Secondary | ICD-10-CM | POA: Diagnosis not present

## 2020-07-20 DIAGNOSIS — F4011 Social phobia, generalized: Secondary | ICD-10-CM | POA: Diagnosis not present

## 2020-07-22 ENCOUNTER — Encounter: Payer: Self-pay | Admitting: Family Medicine

## 2020-07-22 ENCOUNTER — Encounter: Payer: Federal, State, Local not specified - PPO | Admitting: Family Medicine

## 2020-07-22 DIAGNOSIS — F902 Attention-deficit hyperactivity disorder, combined type: Secondary | ICD-10-CM | POA: Insufficient documentation

## 2020-07-26 ENCOUNTER — Other Ambulatory Visit: Payer: Self-pay | Admitting: Family Medicine

## 2020-07-26 DIAGNOSIS — F411 Generalized anxiety disorder: Secondary | ICD-10-CM

## 2020-07-27 MED ORDER — AMPHETAMINE-DEXTROAMPHETAMINE 10 MG PO TABS: 10.0000 mg | ORAL_TABLET | Freq: Three times a day (TID) | ORAL | 0 refills | Status: DC

## 2020-07-27 MED ORDER — CLONAZEPAM 0.5 MG PO TABS: 0.5000 mg | ORAL_TABLET | Freq: Two times a day (BID) | ORAL | 0 refills | Status: DC

## 2020-07-31 ENCOUNTER — Other Ambulatory Visit: Payer: Self-pay | Admitting: Family Medicine

## 2020-08-19 ENCOUNTER — Other Ambulatory Visit: Payer: Self-pay

## 2020-08-19 ENCOUNTER — Encounter (HOSPITAL_COMMUNITY): Payer: Self-pay

## 2020-08-19 ENCOUNTER — Emergency Department (HOSPITAL_COMMUNITY)
Admission: EM | Admit: 2020-08-19 | Discharge: 2020-08-19 | Disposition: A | Discharge status: Home / Self Care | Payer: Federal, State, Local not specified - PPO | Attending: Emergency Medicine | Admitting: Emergency Medicine

## 2020-08-19 DIAGNOSIS — R220 Localized swelling, mass and lump, head: Secondary | ICD-10-CM | POA: Diagnosis not present

## 2020-08-19 DIAGNOSIS — T7840XA Allergy, unspecified, initial encounter: Secondary | ICD-10-CM | POA: Insufficient documentation

## 2020-08-19 DIAGNOSIS — Z79899 Other long term (current) drug therapy: Secondary | ICD-10-CM | POA: Insufficient documentation

## 2020-08-19 DIAGNOSIS — E039 Hypothyroidism, unspecified: Secondary | ICD-10-CM | POA: Diagnosis not present

## 2020-08-19 DIAGNOSIS — F419 Anxiety disorder, unspecified: Secondary | ICD-10-CM | POA: Diagnosis not present

## 2020-08-19 DIAGNOSIS — I10 Essential (primary) hypertension: Secondary | ICD-10-CM | POA: Insufficient documentation

## 2020-08-19 DIAGNOSIS — R0602 Shortness of breath: Secondary | ICD-10-CM | POA: Diagnosis not present

## 2020-08-19 DIAGNOSIS — R0689 Other abnormalities of breathing: Secondary | ICD-10-CM | POA: Diagnosis not present

## 2020-08-19 DIAGNOSIS — R21 Rash and other nonspecific skin eruption: Secondary | ICD-10-CM | POA: Diagnosis not present

## 2020-08-19 HISTORY — DX: Depression, unspecified: F32.A

## 2020-08-19 HISTORY — DX: Panic disorder (episodic paroxysmal anxiety): F41.0

## 2020-08-19 HISTORY — DX: Anxiety disorder, unspecified: F41.9

## 2020-08-19 HISTORY — DX: Attention-deficit hyperactivity disorder, unspecified type: F90.9

## 2020-08-19 MED ORDER — FAMOTIDINE 20 MG PO TABS
20.0000 mg | ORAL_TABLET | Freq: Once | ORAL | Status: AC
  Administered 2020-08-19: 20 mg via ORAL
  Filled 2020-08-19: qty 1

## 2020-08-19 MED ORDER — LORAZEPAM 1 MG PO TABS
2.0000 mg | ORAL_TABLET | Freq: Once | ORAL | Status: AC
  Administered 2020-08-19: 2 mg via ORAL
  Filled 2020-08-19: qty 2

## 2020-08-19 MED ORDER — PREDNISONE 20 MG PO TABS: 20.0000 mg | ORAL_TABLET | Freq: Two times a day (BID) | ORAL | 0 refills | Status: DC

## 2020-08-19 NOTE — ED Triage Notes (Addendum)
Per EMS- patient went to her PCP today with c/o rash, fever, and anxiety.PCP office called EMS. Patient states she has not had her anxiety meds x 2 days Patient has a rash on her back and facial swelling. patiaent was given Solumedrol 125 mg IM and Benadryl 50 mg IM. Patient reported that her fever had been 103.9 lst night. Temp in triage 98.3.

## 2020-08-19 NOTE — Discharge Instructions (Signed)
Follow-up with your medical doctor for further care and treatment as needed.  For continued care of the allergic reaction, use the prednisone prescription as directed.  Also take Benadryl 25 to 50 mg 4 times a day and Pepcid, 20 mg twice a day for 4 or 5 days.  These are to antihistamines that can help to control allergic symptoms.  Return here, if needed.

## 2020-08-19 NOTE — ED Provider Notes (Signed)
[facility] DEPT Provider Note   CSN: [id number] Arrival date & time: 08/19/20  6433     History Chief Complaint  Patient presents with  . Anxiety  . Rash  . Fever  . Allergic Reaction    Cassidy Hopkins is a 45 y.o. female.  HPI Patient presents for evaluation of anxiety and allergic reaction.  She felt like her face was swollen, starting yesterday and was worried about an allergic reaction because she has had that in the past, when she took penicillin.  Reaction occurred at age 37.  She also complains of anxiety which is worsening and preventing her from sleeping well over the last 5 nights.  She typically takes clonazepam 0.5 mg 1 twice a day, for anxiety.  She stated last year she had a fever.  She denies cough, shortness of breath, weakness or dizziness.  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  There are no other known modifying factors.  This morning she went to an urgent care where she was treated with IM Solu-Medrol and Benadryl.  Patient reports that the swelling of her tongue and face have already improved since the treatment which was done within the last 90 minutes.  She was transferred here by EMS, without additional treatment.  There are no other known modifying factors.    Past Medical History:  Diagnosis Date  . Adult ADHD   . Anxiety   . Depression   . Hyperlipidemia   . Hypertension   . Irregular heart rate   . Migraines   . Panic attack   . Thyroid disease     Patient Active Problem List   Diagnosis Date Noted  . ADHD (attention deficit hyperactivity disorder), combined type 07/22/2020  . Insomnia 05/08/2019  . Right ureteral stone 11/04/2018  . Adult ADHD 12/03/2017  . Social anxiety disorder 12/03/2017  . Multiple lipomas 07/07/2016  . Generalized anxiety disorder 03/31/2016  . Hypothyroidism 01/17/2016  . Pseudotumor cerebri 01/17/2016  . Obesity (BMI 30-39.9) 01/17/2016    Past Surgical History:   Procedure Laterality Date  . BREAST SURGERY    . CESAREAN SECTION  2009, 2014, 2016  . CHOLECYSTECTOMY  1997     OB History   No obstetric history on file.     Family History  Problem Relation Age of Onset  . Hypertension Mother   . Hyperlipidemia Mother   . Hyperlipidemia Father   . Heart disease Father   . Hypertension Father   . Diabetes Father   . Colon cancer Maternal Grandmother   . Hyperlipidemia Maternal Grandmother     Social History   Tobacco Use  . Smoking status: Never Smoker  . Smokeless tobacco: Never Used  Vaping Use  . Vaping Use: Never used  Substance Use Topics  . Alcohol use: No  . Drug use: No    Home Medications Prior to Admission medications   Medication Sig Start Date End Date Taking? Authorizing Provider  acetaminophen (TYLENOL) 500 MG tablet Take 500 mg by mouth every 6 (six) hours as needed for mild pain, fever or headache.   Yes [provider]  acetaZOLAMIDE (DIAMOX) 250 MG tablet TAKE 1 TABLET BY MOUTH EVERY DAY Patient taking differently: Take 250 mg by mouth daily. 03/15/20  Yes Orma Flaming, MD  amphetamine-dextroamphetamine (ADDERALL) 10 MG tablet Take 1 tablet (10 mg total) by mouth 3 (three) times daily. 07/27/20  Yes Orma Flaming, MD  hydrochlorothiazide (HYDRODIURIL) 25 MG tablet TAKE  1 TABLET BY MOUTH EVERY DAY Patient taking differently: Take 25 mg by mouth daily. 08/02/20  Yes Orma Flaming, MD  ibuprofen (ADVIL) 200 MG tablet Take 400 mg by mouth every 6 (six) hours as needed for fever, headache or mild pain.   Yes [provider]  levothyroxine (SYNTHROID) 100 MCG tablet TAKE 1 TABLET (100 MCG TOTAL) BY MOUTH DAILY BEFORE BREAKFAST. LABS IN 6-8 WEEKS Patient taking differently: Take 100 mcg by mouth daily before breakfast. 08/20/19  Yes Orma Flaming, MD  predniSONE (DELTASONE) 20 MG tablet Take 1 tablet (20 mg total) by mouth 2 (two) times daily. 08/19/20  Yes Daleen Bo, MD  venlafaxine XR  (EFFEXOR-XR) 75 MG 24 hr capsule TAKE 1 CAPSULE BY MOUTH EVERY DAY WITH BREAKFAST Patient taking differently: Take 75 mg by mouth daily. 04/05/20  Yes Orma Flaming, MD  clonazePAM (KLONOPIN) 0.5 MG tablet Take 1 tablet (0.5 mg total) by mouth 2 (two) times daily. 07/27/20   Orma Flaming, MD    Allergies    Penicillins and Topiramate  Review of Systems   Review of Systems  All other systems reviewed and are negative.   Physical Exam Updated Vital Signs BP (!) 141/105 (BP Location: Right Arm)   Pulse 96   Temp 98.3 F (36.8 C) (Oral)   Resp 18   Ht 5\' 7"  (1.702 m)   Wt 108.9 kg   LMP 08/19/2020   SpO2 96%   BMI 37.59 kg/m   Physical Exam Vitals and nursing note reviewed.  Constitutional:      Appearance: She is well-developed.  HENT:     Head: Normocephalic and atraumatic.     Right Ear: External ear normal.     Left Ear: External ear normal.     Mouth/Throat:     Mouth: Mucous membranes are moist.     Pharynx: Oropharynx is clear. No oropharyngeal exudate or posterior oropharyngeal erythema.     Comments: No swelling of tongue, oropharynx or lips. Eyes:     Conjunctiva/sclera: Conjunctivae normal.     Pupils: Pupils are equal, round, and reactive to light.  Neck:     Trachea: Phonation normal.  Cardiovascular:     Rate and Rhythm: Normal rate and regular rhythm.     Heart sounds: Normal heart sounds.  Pulmonary:     Effort: Pulmonary effort is normal. No respiratory distress.     Breath sounds: Normal breath sounds. No stridor. No wheezing or rhonchi.  Abdominal:     General: There is no distension.  Musculoskeletal:        General: Normal range of motion.     Cervical back: Normal range of motion and neck supple.  Skin:    General: Skin is warm and dry.  Neurological:     Mental Status: She is alert and oriented to person, place, and time.     Cranial Nerves: No cranial nerve deficit.     Sensory: No sensory deficit.     Motor: No abnormal muscle tone.      Coordination: Coordination normal.     Comments: No dysarthria or aphasia.  Psychiatric:        Behavior: Behavior normal.        Thought Content: Thought content normal.        Judgment: Judgment normal.     Comments: Anxious; facial grimacing, continual movement of arms and legs and head, able to minimize symptoms for exam.     ED Results / Procedures / Treatments  Labs (all labs ordered are listed, but only abnormal results are displayed) Labs Reviewed - No data to display  EKG None  Radiology No results found.  Procedures Procedures   Medications Ordered in ED Medications  famotidine (PEPCID) tablet 20 mg (20 mg Oral Given 08/19/20 1013)  LORazepam (ATIVAN) tablet 2 mg (2 mg Oral Given 08/19/20 1012)    ED Course  I have reviewed the triage vital signs and the nursing notes.  Pertinent labs & imaging results that were available during my care of the patient were reviewed by me and considered in my medical decision making (see chart for details).    MDM Rules/Calculators/A&P                           Patient Vitals for the past 24 hrs:  BP Temp Temp src Pulse Resp SpO2 Height Weight  08/19/20 1054 (!) 141/105 98.3 F (36.8 C) Oral 96 18 96 % -- --  08/19/20 0943 -- -- -- -- -- -- 5\' 7"  (1.702 m) 108.9 kg  08/19/20 0931 (!) 130/100 98.3 F (36.8 C) Oral 89 (!) 25 96 % -- --    10:44 AM Reevaluation with update and discussion. After initial assessment and treatment, an updated evaluation reveals she is comfortable and states she is ready for discharge.  Family members will take her home.Daleen Bo   Medical Decision Making:  This patient is presenting for evaluation of anxiety with possible allergic reaction, which does require a range of treatment options, and is a complaint that involves a moderate risk of morbidity and mortality. The differential diagnoses include anxiety, allergic reaction. I decided to review old records, and in summary middle-aged  female presenting with primarily anxiety, but possible allergic reaction.  She was treated prior to arrival and noticed improvement, within a short period of time..  I did not require additional historical information from anyone.    Critical Interventions-clinical evaluation, medication treatment, observation and reassessment  After These Interventions, the Patient was reevaluated and was found stable for discharge.  Patient without worsening symptoms, and nonspecific findings, possibly related to allergic reaction.  No indication for hospitalization at this time.  CRITICAL CARE-no Performed by: Daleen Bo  Nursing Notes Reviewed/ Care Coordinated Applicable Imaging Reviewed Interpretation of Laboratory Data incorporated into ED treatment  The patient appears reasonably screened and/or stabilized for discharge and I doubt any other medical condition or other [facility] requiring further screening, evaluation, or treatment in the ED at this time prior to discharge.  Plan: Home Medications-continue Benadryl and Pepcid for 5 days; Home Treatments-'s, fluids; return here if the recommended treatment, does not improve the symptoms; Recommended follow up-PCP, as needed     Final Clinical Impression(s) / ED Diagnoses Final diagnoses:  Anxiety  Allergic reaction, initial encounter    Rx / DC Orders ED Discharge Orders         Ordered    predniSONE (DELTASONE) 20 MG tablet  2 times daily        08/19/20 1046           Daleen Bo, MD 08/20/20 [id number]

## 2020-08-23 ENCOUNTER — Other Ambulatory Visit: Payer: Self-pay | Admitting: Family Medicine

## 2020-08-23 MED ORDER — AMPHETAMINE-DEXTROAMPHETAMINE 10 MG PO TABS: 10.0000 mg | ORAL_TABLET | Freq: Three times a day (TID) | ORAL | 0 refills | Status: DC

## 2020-08-25 ENCOUNTER — Other Ambulatory Visit: Payer: Self-pay | Admitting: Family Medicine

## 2020-08-25 DIAGNOSIS — F411 Generalized anxiety disorder: Secondary | ICD-10-CM

## 2020-08-26 MED ORDER — CLONAZEPAM 0.5 MG PO TABS
0.5000 mg | ORAL_TABLET | Freq: Two times a day (BID) | ORAL | 0 refills | Status: DC
Start: 2020-08-26 — End: 2020-09-21

## 2020-08-26 NOTE — Telephone Encounter (Signed)
Pt is following up on this. She wants it sent to [facility] in [place]

## 2020-08-26 NOTE — Telephone Encounter (Signed)
Rx request 

## 2020-09-20 ENCOUNTER — Other Ambulatory Visit: Payer: Self-pay | Admitting: Family Medicine

## 2020-09-20 MED ORDER — AMPHETAMINE-DEXTROAMPHETAMINE 10 MG PO TABS: 10.0000 mg | ORAL_TABLET | Freq: Three times a day (TID) | ORAL | 0 refills | Status: DC

## 2020-09-20 NOTE — Telephone Encounter (Signed)
Rx request 

## 2020-09-21 ENCOUNTER — Other Ambulatory Visit: Payer: Self-pay | Admitting: Family Medicine

## 2020-09-21 DIAGNOSIS — F411 Generalized anxiety disorder: Secondary | ICD-10-CM

## 2020-09-22 MED ORDER — CLONAZEPAM 0.5 MG PO TABS
0.5000 mg | ORAL_TABLET | Freq: Two times a day (BID) | ORAL | 0 refills | Status: DC
Start: 2020-09-22 — End: 2020-10-14

## 2020-10-14 ENCOUNTER — Other Ambulatory Visit: Payer: Self-pay | Admitting: Family Medicine

## 2020-10-14 DIAGNOSIS — F411 Generalized anxiety disorder: Secondary | ICD-10-CM

## 2020-10-15 MED ORDER — AMPHETAMINE-DEXTROAMPHETAMINE 10 MG PO TABS: 10.0000 mg | ORAL_TABLET | Freq: Three times a day (TID) | ORAL | 0 refills | Status: DC

## 2020-10-15 MED ORDER — CLONAZEPAM 0.5 MG PO TABS: 0.5000 mg | ORAL_TABLET | Freq: Two times a day (BID) | ORAL | 0 refills | Status: DC

## 2020-10-25 ENCOUNTER — Other Ambulatory Visit: Payer: Self-pay | Admitting: Family Medicine

## 2020-10-25 DIAGNOSIS — M797 Fibromyalgia: Secondary | ICD-10-CM

## 2020-10-25 DIAGNOSIS — N951 Menopausal and female climacteric states: Secondary | ICD-10-CM

## 2020-10-25 NOTE — Telephone Encounter (Signed)
Cassidy Hopkins, pt is scheduled to see you on 8/3. TOC from Dr. Rogers Blocker. Please refill medication.

## 2020-11-15 ENCOUNTER — Other Ambulatory Visit: Payer: Self-pay | Admitting: Family Medicine

## 2020-11-15 DIAGNOSIS — F411 Generalized anxiety disorder: Secondary | ICD-10-CM

## 2020-11-16 ENCOUNTER — Other Ambulatory Visit: Payer: Self-pay | Admitting: Family Medicine

## 2020-11-16 DIAGNOSIS — F411 Generalized anxiety disorder: Secondary | ICD-10-CM

## 2020-11-16 MED ORDER — CLONAZEPAM 0.5 MG PO TABS: 0.5000 mg | ORAL_TABLET | Freq: Two times a day (BID) | ORAL | 0 refills | Status: DC

## 2020-11-16 NOTE — Telephone Encounter (Signed)
Pt requesting Clonazepam 0.5 mg tab LOV: 04/22/2020 Next Visit: 12/22/2020- TOC Maximiano Coss Last refill: 10/15/2020.  Approve?

## 2020-11-16 NOTE — Telephone Encounter (Signed)
LAST APPOINTMENT DATE: 04/22/2020   NEXT APPOINTMENT DATE: Visit date not found    LAST REFILL:  10/15/2020

## 2020-11-16 NOTE — Telephone Encounter (Signed)
Duplicated Rx Unable to refused

## 2020-11-16 NOTE — Telephone Encounter (Signed)
Adderall can cause anxiety-patient also prescribed clonazepam for anxiety-I believe patient needs in person evaluation before this is provided by a new provider.  May need to consider psychiatry visit.   I refilled the clonazepam but I believe it printed-you can resend to me if they cannot accept as a fax

## 2020-11-17 ENCOUNTER — Other Ambulatory Visit: Payer: Self-pay | Admitting: Family Medicine

## 2020-11-17 MED ORDER — AMPHETAMINE-DEXTROAMPHETAMINE 10 MG PO TABS: 10.0000 mg | ORAL_TABLET | Freq: Three times a day (TID) | ORAL | 0 refills | Status: DC

## 2020-11-17 NOTE — Telephone Encounter (Signed)
Pt requesting refill for Adderall 10 mg one tablet TID. Pt is scheduled to see you 12/22/2020. Last OV 04/2020 with Dr. Rogers Blocker.

## 2020-12-13 ENCOUNTER — Other Ambulatory Visit: Payer: Self-pay | Admitting: Registered Nurse

## 2020-12-13 ENCOUNTER — Other Ambulatory Visit: Payer: Self-pay | Admitting: Family Medicine

## 2020-12-13 DIAGNOSIS — F411 Generalized anxiety disorder: Secondary | ICD-10-CM

## 2020-12-13 MED ORDER — CLONAZEPAM 0.5 MG PO TABS: 0.5000 mg | ORAL_TABLET | Freq: Two times a day (BID) | ORAL | 0 refills | Status: DC

## 2020-12-13 MED ORDER — AMPHETAMINE-DEXTROAMPHETAMINE 10 MG PO TABS: 10.0000 mg | ORAL_TABLET | Freq: Three times a day (TID) | ORAL | 0 refills | Status: DC

## 2020-12-22 ENCOUNTER — Encounter: Payer: Federal, State, Local not specified - PPO | Admitting: Registered Nurse

## 2021-01-09 ENCOUNTER — Encounter: Payer: Self-pay | Admitting: Registered Nurse

## 2021-01-10 ENCOUNTER — Telehealth: Payer: Self-pay

## 2021-01-10 ENCOUNTER — Telehealth: Payer: Self-pay | Admitting: Family Medicine

## 2021-01-10 NOTE — Telephone Encounter (Signed)
  Encourage patient to contact the pharmacy for refills or they can request refills through [facility]: 04/22/20  NEXT APPOINTMENT DATE: TOC to [facility]ard 03/10/21   MEDICATION: amphetamine-dextroamphetamine (ADDERALL) 10 MG tablet // clonazePAM (KLONOPIN) 0.5 MG tablet  Is the patient out of medication? Yes   [id number] [place], [place] - [address]  Let patient know to contact pharmacy at the end of the day to make sure medication is ready.  Please notify patient to allow 48-72 hours to process

## 2021-01-11 ENCOUNTER — Other Ambulatory Visit: Payer: Self-pay | Admitting: Family Medicine

## 2021-01-11 ENCOUNTER — Other Ambulatory Visit: Payer: Self-pay | Admitting: Registered Nurse

## 2021-01-11 DIAGNOSIS — F411 Generalized anxiety disorder: Secondary | ICD-10-CM

## 2021-01-11 MED ORDER — AMPHETAMINE-DEXTROAMPHETAMINE 10 MG PO TABS: 10.0000 mg | ORAL_TABLET | Freq: Three times a day (TID) | ORAL | 0 refills | Status: DC

## 2021-01-11 MED ORDER — CLONAZEPAM 0.5 MG PO TABS
0.5000 mg | ORAL_TABLET | Freq: Two times a day (BID) | ORAL | 0 refills | Status: DC
Start: 2021-01-11 — End: 2021-03-06

## 2021-01-11 NOTE — Telephone Encounter (Signed)
Encounter started in error.

## 2021-01-12 NOTE — Telephone Encounter (Signed)
See Rx request ° °

## 2021-01-13 ENCOUNTER — Telehealth (INDEPENDENT_AMBULATORY_CARE_PROVIDER_SITE_OTHER): Payer: Self-pay

## 2021-01-25 ENCOUNTER — Encounter (HOSPITAL_BASED_OUTPATIENT_CLINIC_OR_DEPARTMENT_OTHER): Payer: Self-pay | Admitting: Emergency Medicine

## 2021-01-25 ENCOUNTER — Emergency Department (HOSPITAL_BASED_OUTPATIENT_CLINIC_OR_DEPARTMENT_OTHER)
Admission: EM | Admit: 2021-01-25 | Discharge: 2021-01-25 | Disposition: A | Discharge status: Home / Self Care | Payer: Federal, State, Local not specified - PPO | Attending: Emergency Medicine | Admitting: Emergency Medicine

## 2021-01-25 ENCOUNTER — Other Ambulatory Visit: Payer: Self-pay

## 2021-01-25 DIAGNOSIS — E039 Hypothyroidism, unspecified: Secondary | ICD-10-CM | POA: Diagnosis not present

## 2021-01-25 DIAGNOSIS — Z79899 Other long term (current) drug therapy: Secondary | ICD-10-CM | POA: Insufficient documentation

## 2021-01-25 DIAGNOSIS — R221 Localized swelling, mass and lump, neck: Secondary | ICD-10-CM | POA: Diagnosis not present

## 2021-01-25 DIAGNOSIS — I10 Essential (primary) hypertension: Secondary | ICD-10-CM | POA: Diagnosis not present

## 2021-01-25 DIAGNOSIS — R220 Localized swelling, mass and lump, head: Secondary | ICD-10-CM | POA: Diagnosis not present

## 2021-01-25 NOTE — Discharge Instructions (Addendum)
Return here for any trouble breathing, fever, worsening swelling

## 2021-01-25 NOTE — ED Provider Notes (Signed)
[facility] EMERGENCY DEPT Provider Note   CSN: [id number] Arrival date & time: 01/25/21  1835     History Chief Complaint  Patient presents with   Facial Swelling    Cassidy Hopkins is a 45 y.o. female.  45 year old female presents with 1 day of swelling to the right side of her neck.  Denies any trouble swallowing.  No fever or chills.  Thought maybe was allergic reaction and took Zyrtec without any changes.  Denies any tooth discomfort.  Pain is characterizes sharp and worse with movement of the right side of her jaw.  No prior history of same      Past Medical History:  Diagnosis Date   Adult ADHD    Anxiety    Depression    Hyperlipidemia    Hypertension    Irregular heart rate    Migraines    Panic attack    Thyroid disease     Patient Active Problem List   Diagnosis Date Noted   ADHD (attention deficit hyperactivity disorder), combined type 07/22/2020   Insomnia 05/08/2019   Right ureteral stone 11/04/2018   Adult ADHD 12/03/2017   Social anxiety disorder 12/03/2017   Multiple lipomas 07/07/2016   Generalized anxiety disorder 03/31/2016   Hypothyroidism 01/17/2016   Pseudotumor cerebri 01/17/2016   Obesity (BMI 30-39.9) 01/17/2016    Past Surgical History:  Procedure Laterality Date   BREAST SURGERY     CESAREAN SECTION  2009, 2014, 2016   CHOLECYSTECTOMY  1997     OB History   No obstetric history on file.     Family History  Problem Relation Age of Onset   Hypertension Mother    Hyperlipidemia Mother    Hyperlipidemia Father    Heart disease Father    Hypertension Father    Diabetes Father    Colon cancer Maternal Grandmother    Hyperlipidemia Maternal Grandmother     Social History   Tobacco Use   Smoking status: Never   Smokeless tobacco: Never  Vaping Use   Vaping Use: Never used  Substance Use Topics   Alcohol use: No   Drug use: No    Home Medications Prior to Admission medications   Medication Sig Start Date  End Date Taking? Authorizing Provider  clonazePAM (KLONOPIN) 0.5 MG tablet TAKE 1 TABLET BY MOUTH 2 TIMES DAILY. DO NOT DRIVE FOR 12 HOURS AFTER TAKING. 01/11/21   Vivi Barrack, MD  acetaminophen (TYLENOL) 500 MG tablet Take 500 mg by mouth every 6 (six) hours as needed for mild pain, fever or headache.    [provider]  acetaZOLAMIDE (DIAMOX) 250 MG tablet TAKE 1 TABLET BY MOUTH EVERY DAY Patient taking differently: Take 250 mg by mouth daily. 03/15/20   Orma Flaming, MD  amphetamine-dextroamphetamine (ADDERALL) 10 MG tablet Take 1 tablet (10 mg total) by mouth 3 (three) times daily. DUE for 3  Month f/u 01/11/21   Maximiano Coss, NP  clonazePAM (KLONOPIN) 0.5 MG tablet TAKE 1 TABLET BY MOUTH 2 TIMES DAILY. 11/16/20   Marin Olp, MD  clonazePAM (KLONOPIN) 0.5 MG tablet Take 1 tablet (0.5 mg total) by mouth 2 (two) times daily. Do not drive for 12 hours after taking.  Signing for patient as transferring to new provider at a different office until transition 01/11/21   Maximiano Coss, NP  hydrochlorothiazide (HYDRODIURIL) 25 MG tablet TAKE 1 TABLET BY MOUTH EVERY DAY Patient taking differently: Take 25 mg by mouth daily. 08/02/20   Orma Flaming,  MD  ibuprofen (ADVIL) 200 MG tablet Take 400 mg by mouth every 6 (six) hours as needed for fever, headache or mild pain.    [provider]  levothyroxine (SYNTHROID) 100 MCG tablet TAKE 1 TABLET (100 MCG TOTAL) BY MOUTH DAILY BEFORE BREAKFAST. LABS IN 6-8 WEEKS Patient taking differently: Take 100 mcg by mouth daily before breakfast. 08/20/19   Orma Flaming, MD  predniSONE (DELTASONE) 20 MG tablet Take 1 tablet (20 mg total) by mouth 2 (two) times daily. 08/19/20   Daleen Bo, MD  venlafaxine XR (EFFEXOR-XR) 75 MG 24 hr capsule TAKE 1 CAPSULE BY MOUTH EVERY DAY WITH BREAKFAST 10/25/20   Maximiano Coss, NP    Allergies    Penicillins and Topiramate  Review of Systems   Review of Systems  All other systems reviewed and  are negative.  Physical Exam Updated Vital Signs BP 118/77 (BP Location: Left Arm)   Pulse 84   Temp 98.2 F (36.8 C) (Oral)   Resp 19   Ht 1.676 m ('5\' 6"'$ )   Wt 111.1 kg   SpO2 98%   BMI 39.54 kg/m   Physical Exam Vitals and nursing note reviewed.  Constitutional:      General: She is not in acute distress.    Appearance: Normal appearance. She is well-developed. She is not toxic-appearing.  HENT:     Head: Normocephalic and atraumatic.   Eyes:     General: Lids are normal.     Conjunctiva/sclera: Conjunctivae normal.     Pupils: Pupils are equal, round, and reactive to light.  Neck:     Thyroid: No thyroid mass.     Trachea: No tracheal deviation.  Cardiovascular:     Rate and Rhythm: Normal rate and regular rhythm.     Heart sounds: Normal heart sounds. No murmur heard.   No gallop.  Pulmonary:     Effort: Pulmonary effort is normal. No respiratory distress.     Breath sounds: Normal breath sounds. No stridor. No decreased breath sounds, wheezing, rhonchi or rales.  Abdominal:     General: There is no distension.     Palpations: Abdomen is soft.     Tenderness: There is no abdominal tenderness. There is no rebound.  Musculoskeletal:        General: No tenderness. Normal range of motion.     Cervical back: Normal range of motion and neck supple.  Skin:    General: Skin is warm and dry.     Findings: No abrasion or rash.  Neurological:     Mental Status: She is alert and oriented to person, place, and time. Mental status is at baseline.     GCS: GCS eye subscore is 4. GCS verbal subscore is 5. GCS motor subscore is 6.     Cranial Nerves: Cranial nerves are intact. No cranial nerve deficit.     Sensory: No sensory deficit.     Motor: Motor function is intact.  Psychiatric:        Attention and Perception: Attention normal.        Speech: Speech normal.        Behavior: Behavior normal.    ED Results / Procedures / Treatments   Labs (all labs ordered are  listed, but only abnormal results are displayed) Labs Reviewed - No data to display  EKG None  Radiology No results found.  Procedures Procedures   Medications Ordered in ED Medications - No data to display  ED Course  I  have reviewed the triage vital signs and the nursing notes.  Pertinent labs & imaging results that were available during my care of the patient were reviewed by me and considered in my medical decision making (see chart for details).    MDM Rules/Calculators/A&P                           Patient offered a CT of her neck for further evaluation which she has deferred at this time.  Will treat with NSAIDs and she will return if worse Final Clinical Impression(s) / ED Diagnoses Final diagnoses:  None    Rx / DC Orders ED Discharge Orders     None        Lacretia Leigh, MD 01/25/21 2130

## 2021-01-25 NOTE — ED Triage Notes (Signed)
Pt arrives to ED with c/o right sided facial swelling. Pt reports she felt like the right side of her neck, under her mandible started swelling about an hour ago. Pt denies SOB, CP, swelling to throat, and tongue. No memory of recent allergic reaction triggers. Pt has not taken any OTC meds for this today. No lymphopathy. Pt reports her mandible is sore.

## 2021-02-04 ENCOUNTER — Other Ambulatory Visit: Payer: Self-pay | Admitting: Registered Nurse

## 2021-02-04 MED ORDER — AMPHETAMINE-DEXTROAMPHETAMINE 10 MG PO TABS: 10.0000 mg | ORAL_TABLET | Freq: Three times a day (TID) | ORAL | 0 refills | Status: DC

## 2021-02-09 NOTE — Telephone Encounter (Signed)
error 

## 2021-02-24 ENCOUNTER — Other Ambulatory Visit: Payer: Self-pay | Admitting: Family Medicine

## 2021-02-24 ENCOUNTER — Other Ambulatory Visit: Payer: Self-pay

## 2021-03-06 ENCOUNTER — Other Ambulatory Visit: Payer: Self-pay | Admitting: Registered Nurse

## 2021-03-06 DIAGNOSIS — F411 Generalized anxiety disorder: Secondary | ICD-10-CM

## 2021-03-07 MED ORDER — AMPHETAMINE-DEXTROAMPHETAMINE 10 MG PO TABS: 10.0000 mg | ORAL_TABLET | Freq: Three times a day (TID) | ORAL | 0 refills | Status: DC

## 2021-03-07 MED ORDER — CLONAZEPAM 0.5 MG PO TABS: 0.5000 mg | ORAL_TABLET | Freq: Two times a day (BID) | ORAL | 0 refills | Status: DC

## 2021-03-10 ENCOUNTER — Encounter: Payer: Self-pay | Admitting: Registered Nurse

## 2021-03-10 ENCOUNTER — Ambulatory Visit: Payer: Federal, State, Local not specified - PPO | Admitting: Registered Nurse

## 2021-03-10 ENCOUNTER — Other Ambulatory Visit: Payer: Self-pay

## 2021-03-10 VITALS — BP 122/80 | HR 79 | Temp 97.9°F | Resp 18 | Ht 66.0 in | Wt 238.2 lb

## 2021-03-10 DIAGNOSIS — F4010 Social phobia, unspecified: Secondary | ICD-10-CM | POA: Diagnosis not present

## 2021-03-10 DIAGNOSIS — G8929 Other chronic pain: Secondary | ICD-10-CM

## 2021-03-10 DIAGNOSIS — E782 Mixed hyperlipidemia: Secondary | ICD-10-CM | POA: Diagnosis not present

## 2021-03-10 DIAGNOSIS — Z1159 Encounter for screening for other viral diseases: Secondary | ICD-10-CM

## 2021-03-10 DIAGNOSIS — E039 Hypothyroidism, unspecified: Secondary | ICD-10-CM

## 2021-03-10 DIAGNOSIS — F4321 Adjustment disorder with depressed mood: Secondary | ICD-10-CM | POA: Diagnosis not present

## 2021-03-10 DIAGNOSIS — F909 Attention-deficit hyperactivity disorder, unspecified type: Secondary | ICD-10-CM

## 2021-03-10 DIAGNOSIS — I10 Essential (primary) hypertension: Secondary | ICD-10-CM

## 2021-03-10 DIAGNOSIS — F902 Attention-deficit hyperactivity disorder, combined type: Secondary | ICD-10-CM

## 2021-03-10 DIAGNOSIS — R0681 Apnea, not elsewhere classified: Secondary | ICD-10-CM

## 2021-03-10 DIAGNOSIS — F411 Generalized anxiety disorder: Secondary | ICD-10-CM

## 2021-03-10 DIAGNOSIS — Z13228 Encounter for screening for other metabolic disorders: Secondary | ICD-10-CM | POA: Diagnosis not present

## 2021-03-10 DIAGNOSIS — M5442 Lumbago with sciatica, left side: Secondary | ICD-10-CM

## 2021-03-10 DIAGNOSIS — Z1211 Encounter for screening for malignant neoplasm of colon: Secondary | ICD-10-CM

## 2021-03-10 DIAGNOSIS — M5441 Lumbago with sciatica, right side: Secondary | ICD-10-CM

## 2021-03-10 LAB — CBC WITH DIFFERENTIAL/PLATELET
Basophils Absolute: 0 10*3/uL (ref 0.0–0.1)
Basophils Relative: 0.8 % (ref 0.0–3.0)
Eosinophils Absolute: 0.3 10*3/uL (ref 0.0–0.7)
Eosinophils Relative: 5 % (ref 0.0–5.0)
HCT: 41.4 % (ref 36.0–46.0)
Hemoglobin: 14.1 g/dL (ref 12.0–15.0)
Lymphocytes Relative: 37.1 % (ref 12.0–46.0)
Lymphs Abs: 2.1 10*3/uL (ref 0.7–4.0)
MCHC: 34 g/dL (ref 30.0–36.0)
MCV: 91.2 fl (ref 78.0–100.0)
Monocytes Absolute: 0.3 10*3/uL (ref 0.1–1.0)
Monocytes Relative: 5.9 % (ref 3.0–12.0)
Neutro Abs: 2.9 10*3/uL (ref 1.4–7.7)
Neutrophils Relative %: 51.2 % (ref 43.0–77.0)
Platelets: 367 10*3/uL (ref 150.0–400.0)
RBC: 4.54 Mil/uL (ref 3.87–5.11)
RDW: 13.5 % (ref 11.5–15.5)
WBC: 5.7 10*3/uL (ref 4.0–10.5)

## 2021-03-10 LAB — LIPID PANEL
Cholesterol: 231 mg/dL — ABNORMAL HIGH (ref 0–200)
HDL: 71.2 mg/dL (ref >39.00)
LDL Cholesterol: 147 mg/dL — ABNORMAL HIGH (ref 0–99)
NonHDL: 159.98
Total CHOL/HDL Ratio: 3
Triglycerides: 65 mg/dL (ref 0.0–149.0)
VLDL: 13 mg/dL (ref 0.0–40.0)

## 2021-03-10 LAB — COMPREHENSIVE METABOLIC PANEL
ALT: 14 U/L (ref 0–35)
AST: 23 U/L (ref 0–37)
Albumin: 4.4 g/dL (ref 3.5–5.2)
Alkaline Phosphatase: 90 U/L (ref 39–117)
BUN: 15 mg/dL (ref 6–23)
CO2: 28 mEq/L (ref 19–32)
Calcium: 9.3 mg/dL (ref 8.4–10.5)
Chloride: 98 mEq/L (ref 96–112)
Creatinine, Ser: 0.84 mg/dL (ref 0.40–1.20)
GFR: 82.69 mL/min (ref >60.00)
Glucose, Bld: 108 mg/dL — ABNORMAL HIGH (ref 70–99)
Potassium: 2.9 mEq/L — ABNORMAL LOW (ref 3.5–5.1)
Sodium: 135 mEq/L (ref 135–145)
Total Bilirubin: 0.6 mg/dL (ref 0.2–1.2)
Total Protein: 7.3 g/dL (ref 6.0–8.3)

## 2021-03-10 LAB — HEMOGLOBIN A1C: Hgb A1c MFr Bld: 5.5 % (ref 4.6–6.5)

## 2021-03-10 LAB — VITAMIN D 25 HYDROXY (VIT D DEFICIENCY, FRACTURES): VITD: 20.22 ng/mL — ABNORMAL LOW (ref 30.00–100.00)

## 2021-03-10 LAB — TSH: TSH: 70.89 u[IU]/mL — ABNORMAL HIGH (ref 0.35–5.50)

## 2021-03-10 MED ORDER — LEVOTHYROXINE SODIUM 100 MCG PO TABS: 100.0000 ug | ORAL_TABLET | Freq: Every day | ORAL | 0 refills | Status: DC

## 2021-03-10 MED ORDER — HYDROCHLOROTHIAZIDE 25 MG PO TABS: 25.0000 mg | ORAL_TABLET | Freq: Every day | ORAL | 0 refills | Status: DC

## 2021-03-10 MED ORDER — CYCLOBENZAPRINE HCL 5 MG PO TABS: 5.0000 mg | ORAL_TABLET | Freq: Three times a day (TID) | ORAL | 1 refills | Status: DC | PRN

## 2021-03-10 MED ORDER — ACETAZOLAMIDE 250 MG PO TABS: 250.0000 mg | ORAL_TABLET | Freq: Every day | ORAL | 0 refills | Status: DC

## 2021-03-10 NOTE — Patient Instructions (Addendum)
Ms. Cassidy Hopkins to meet you. I'm looking forward to meeting you  Labs will be back soon - I'll let you know how they look on MyChart. I have sent flexeril for back pain. I will order an xray of your back to [facility] imaging on [address] - they take walk ins! I have refilled all meds x 3 mo I have referred to GI for colonoscopy. If venlafaxine ineffective, can consider psychiatry referral if we need to.  See you in 3 mo!  Thank you  Rich     If you have lab work done today you will be contacted with your lab results within the next 2 weeks.  If you have not heard from [place] then please contact us. The fastest way to get your results is to register for My Chart.   IF you received an x-ray today, you will receive an invoice from [facility]. Please contact [facility] at [phone] with questions or concerns regarding your invoice.   IF you received labwork today, you will receive an invoice from [facility]. Please contact [facility] at [phone] with questions or concerns regarding your invoice.   Our billing staff will not be able to assist you with questions regarding bills from these companies.  You will be contacted with the lab results as soon as they are available. The fastest way to get your results is to activate your My Chart account. Instructions are located on the last page of this paperwork. If you have not heard from [place] regarding the results in 2 weeks, please contact this office.

## 2021-03-10 NOTE — Progress Notes (Signed)
Established Patient Office Visit  Subjective:  Patient ID: Cassidy Hopkins, female    DOB: 1975/09/14  Age: 45 y.o. MRN: [id number]  CC:  Chief Complaint  Patient presents with   Transitions Of Care    Patient states she is here for TOC. Patient states she ould like to discuss medication refills , depression PHQ=9 and anxiety GAD7=18    HPI [name] presents for visit to est care.  No acute concerns  Histories reviewed and updated with patient.   ADHD Adderall 10mg  IR po tid.  Good effect, no AE PDMP consulted.   Anxiety and Depression Taking effexor xr 75mg  po qd Concern for breakthrough symptoms complicated by grief - Her sister unfortunately took her own life late 2020, she lost her mother to CVA days later.  Interested in referral to psychiatry Occasional clonazepam 0.5mg  po bid use. Aware of risks of benzodiazepines.  Hypertension: Patient Currently taking: hctz 25mg  po qd, acetazolimide 250mg  po qd Good effect. No AEs. Denies CV symptoms including: chest pain, shob, doe, headache, visual changes, fatigue, claudication, and dependent edema.   Previous readings and labs: BP Readings from Last 3 Encounters:  03/10/21 122/80  01/25/21 112/75  08/19/20 (!) 141/105   Lab Results  Component Value Date   CREATININE 0.69 02/10/2019   Hld Controlled by diet and lifestyle Lab Results  Component Value Date   CHOL 224 (H) 02/10/2019   HDL 46.80 02/10/2019   LDLCALC 112 (H) 03/04/2018   LDLDIRECT 143.0 02/10/2019   TRIG 233.0 (H) 02/10/2019   CHOLHDL 5 02/10/2019   Hypothyroidism Taking synthroid 160mcg po qd. Good effect, no AE Lab Results  Component Value Date   TSH 0.36 05/30/2019   Back pain Chronic, sciatica on both sides Worsening Limited ROM in legs and lower back Does have episodes of incontinence occasionally - some leaking of urine. Has been suggested to get injections in past - has put these off.  Does take a lot of ibuprofen in past.    Past Medical History:  Diagnosis Date   Adult ADHD    Anxiety    Depression    Hyperlipidemia    Hypertension    Irregular heart rate    Migraines    Panic attack    Thyroid disease     Past Surgical History:  Procedure Laterality Date   BREAST SURGERY     CESAREAN SECTION  2009, 2014, 2016   CHOLECYSTECTOMY  1997    Family History  Problem Relation Age of Onset   Hypertension Mother    Hyperlipidemia Mother    Hyperlipidemia Father    Heart disease Father    Hypertension Father    Diabetes Father    Colon cancer Maternal Grandmother    Hyperlipidemia Maternal Grandmother     Social History   Socioeconomic History   Marital status: Married    Spouse name: Not on file   Number of children: Not on file   Years of education: Not on file   Highest education level: Not on file  Occupational History   Not on file  Tobacco Use   Smoking status: Never   Smokeless tobacco: Never  Vaping Use   Vaping Use: Never used  Substance and Sexual Activity   Alcohol use: No   Drug use: No   Sexual activity: Not on file  Other Topics Concern   Not on file  Social History Narrative   Not on file   Social Determinants of Health  Financial Resource Strain: Not on file  Food Insecurity: Not on file  Transportation Needs: Not on file  Physical Activity: Not on file  Stress: Not on file  Social Connections: Not on file  Intimate Partner Violence: Not on file    Outpatient Medications Prior to Visit  Medication Sig Dispense Refill   acetaminophen (TYLENOL) 500 MG tablet Take 500 mg by mouth every 6 (six) hours as needed for mild pain, fever or headache.     amphetamine-dextroamphetamine (ADDERALL) 10 MG tablet Take 1 tablet (10 mg total) by mouth 3 (three) times daily. DUE for 3  Month f/u 90 tablet 0   clonazePAM (KLONOPIN) 0.5 MG tablet Take 1 tablet (0.5 mg total) by mouth 2 (two) times daily. Do not drive for 12 hours after taking.  Signing for patient as  transferring to new provider at a different office until transition 60 tablet 0   venlafaxine XR (EFFEXOR-XR) 75 MG 24 hr capsule TAKE 1 CAPSULE BY MOUTH EVERY DAY WITH BREAKFAST 90 capsule 0   acetaZOLAMIDE (DIAMOX) 250 MG tablet TAKE 1 TABLET BY MOUTH EVERY DAY (Patient taking differently: Take 250 mg by mouth daily.) 90 tablet 3   clonazePAM (KLONOPIN) 0.5 MG tablet TAKE 1 TABLET BY MOUTH 2 TIMES DAILY. 60 tablet 0   clonazePAM (KLONOPIN) 0.5 MG tablet TAKE 1 TABLET BY MOUTH 2 TIMES DAILY. DO NOT DRIVE FOR 12 HOURS AFTER TAKING. 60 tablet 0   hydrochlorothiazide (HYDRODIURIL) 25 MG tablet TAKE 1 TABLET BY MOUTH EVERY DAY 30 tablet 0   levothyroxine (SYNTHROID) 100 MCG tablet TAKE 1 TABLET (100 MCG TOTAL) BY MOUTH DAILY BEFORE BREAKFAST. LABS IN 6-8 WEEKS (Patient taking differently: Take 100 mcg by mouth daily before breakfast.) 90 tablet 3   ibuprofen (ADVIL) 200 MG tablet Take 400 mg by mouth every 6 (six) hours as needed for fever, headache or mild pain.     predniSONE (DELTASONE) 20 MG tablet Take 1 tablet (20 mg total) by mouth 2 (two) times daily. 10 tablet 0   No facility-administered medications prior to visit.    Allergies  Allergen Reactions   Penicillins Rash and Shortness Of Breath   Topiramate Anxiety    ROS Review of Systems  Constitutional: Negative.   HENT: Negative.    Eyes: Negative.   Respiratory: Negative.    Cardiovascular: Negative.   Gastrointestinal: Negative.   Genitourinary: Negative.   Musculoskeletal: Negative.   Skin: Negative.   Neurological: Negative.   Psychiatric/Behavioral:  Positive for decreased concentration, dysphoric mood and sleep disturbance. Negative for agitation, behavioral problems, confusion, hallucinations, self-injury and suicidal ideas. The patient is nervous/anxious. The patient is not hyperactive.   All other systems reviewed and are negative.    Objective:    Physical Exam Vitals and nursing note reviewed.  Constitutional:       General: She is not in acute distress.    Appearance: Normal appearance. She is normal weight. She is not ill-appearing, toxic-appearing or diaphoretic.  Cardiovascular:     Rate and Rhythm: Normal rate and regular rhythm.     Heart sounds: Normal heart sounds. No murmur heard.   No friction rub. No gallop.  Pulmonary:     Effort: Pulmonary effort is normal. No respiratory distress.     Breath sounds: Normal breath sounds. No stridor. No wheezing, rhonchi or rales.  Chest:     Chest wall: No tenderness.  Skin:    General: Skin is warm and dry.  Neurological:  General: No focal deficit present.     Mental Status: She is alert and oriented to person, place, and time. Mental status is at baseline.  Psychiatric:        Mood and Affect: Mood normal.        Behavior: Behavior normal.        Thought Content: Thought content normal.        Judgment: Judgment normal.    BP 122/80   Pulse 79   Temp 97.9 F (36.6 C) (Temporal)   Resp 18   Ht 5\' 6"  (1.676 m)   Wt 238 lb 3.2 oz (108 kg)   SpO2 100%   BMI 38.45 kg/m  Wt Readings from Last 3 Encounters:  03/10/21 238 lb 3.2 oz (108 kg)  01/25/21 245 lb (111.1 kg)  08/19/20 240 lb (108.9 kg)     Health Maintenance Due  Topic Date Due   Hepatitis C Screening  Never done   COLONOSCOPY (Pts 45-3yrs Insurance coverage will need to be confirmed)  Never done   COVID-19 Vaccine (4 - Booster for [place] series) 08/30/2020    There are no preventive care reminders to display for this patient.  Lab Results  Component Value Date   TSH 0.36 05/30/2019   Lab Results  Component Value Date   WBC 5.8 02/10/2019   HGB 15.2 (H) 02/10/2019   HCT 44.2 02/10/2019   MCV 89.2 02/10/2019   PLT 305.0 02/10/2019   Lab Results  Component Value Date   NA 138 02/10/2019   K 3.5 02/10/2019   CO2 27 02/10/2019   GLUCOSE 88 02/10/2019   BUN 13 02/10/2019   CREATININE 0.69 02/10/2019   BILITOT 0.4 02/10/2019   ALKPHOS 120 (H) 02/10/2019    AST 18 02/10/2019   ALT 14 02/10/2019   PROT 6.8 02/10/2019   ALBUMIN 3.8 02/10/2019   CALCIUM 9.3 02/10/2019   GFR 91.82 02/10/2019   Lab Results  Component Value Date   CHOL 224 (H) 02/10/2019   Lab Results  Component Value Date   HDL 46.80 02/10/2019   Lab Results  Component Value Date   LDLCALC 112 (H) 03/04/2018   Lab Results  Component Value Date   TRIG 233.0 (H) 02/10/2019   Lab Results  Component Value Date   CHOLHDL 5 02/10/2019   Lab Results  Component Value Date   HGBA1C 5.0 03/04/2018      Assessment & Plan:   Problem List Items Addressed This Visit       Endocrine   Hypothyroidism   Relevant Medications   levothyroxine (SYNTHROID) 100 MCG tablet   Other Relevant Orders   TSH     Other   Generalized anxiety disorder   Adult ADHD   Social anxiety disorder   ADHD (attention deficit hyperactivity disorder), combined type   Other Visit Diagnoses     Complicated grief    -  Primary   Primary hypertension       Relevant Medications   acetaZOLAMIDE (DIAMOX) 250 MG tablet   hydrochlorothiazide (HYDRODIURIL) 25 MG tablet   Other Relevant Orders   CBC with Differential/Platelet   Comprehensive metabolic panel   Hemoglobin A1c   Mixed hyperlipidemia       Relevant Medications   acetaZOLAMIDE (DIAMOX) 250 MG tablet   hydrochlorothiazide (HYDRODIURIL) 25 MG tablet   Other Relevant Orders   Lipid panel   Screening for metabolic disorder       Relevant Orders   Vitamin D (25  hydroxy)   Witnessed episode of apnea       Relevant Orders   Ambulatory referral to Neurology   Chronic bilateral low back pain with bilateral sciatica       Relevant Medications   cyclobenzaprine (FLEXERIL) 5 MG tablet   Other Relevant Orders   DG Lumbar Spine Complete   Encounter for screening for other viral diseases       Relevant Orders   Hepatitis C Antibody   Screen for colon cancer       Relevant Orders   Ambulatory referral to Gastroenterology        Meds ordered this encounter  Medications   cyclobenzaprine (FLEXERIL) 5 MG tablet    Sig: Take 1 tablet (5 mg total) by mouth 3 (three) times daily as needed for muscle spasms.    Dispense:  30 tablet    Refill:  1    Order Specific Question:   Supervising Provider    Answer:   Carlota Raspberry, JEFFREY R [[id number]]   levothyroxine (SYNTHROID) 100 MCG tablet    Sig: Take 1 tablet (100 mcg total) by mouth daily before breakfast.    Dispense:  90 tablet    Refill:  0    Order Specific Question:   Supervising Provider    Answer:   Carlota Raspberry, JEFFREY R [[id number]]   acetaZOLAMIDE (DIAMOX) 250 MG tablet    Sig: Take 1 tablet (250 mg total) by mouth daily.    Dispense:  90 tablet    Refill:  0    NEED REFILLS    Order Specific Question:   Supervising Provider    Answer:   Carlota Raspberry, JEFFREY R [[id number]]   hydrochlorothiazide (HYDRODIURIL) 25 MG tablet    Sig: Take 1 tablet (25 mg total) by mouth daily.    Dispense:  90 tablet    Refill:  0    Pt will establish care with new PCP 03/10/2021.    Order Specific Question:   Supervising Provider    Answer:   Wendie Agreste [[id number]]    Follow-up: Return in about 3 months (around 06/10/2021) for follow up .   PLAN Labs collected. Will follow up with the patient as warranted. Restart synthroid Continue venlafaxine and clonazepam. Flexeril for back pain. Will obtain dg lumbar. Follow up as warranted. Refer to GI for colonoscopy Refill hctz and acetazolimide.  Return in 3 mo for repeat labs and follow up Patient encouraged to call clinic with any questions, comments, or concerns.  Maximiano Coss, NP

## 2021-03-11 ENCOUNTER — Telehealth: Payer: Self-pay | Admitting: Registered Nurse

## 2021-03-11 ENCOUNTER — Other Ambulatory Visit: Payer: Self-pay | Admitting: Registered Nurse

## 2021-03-11 DIAGNOSIS — E876 Hypokalemia: Secondary | ICD-10-CM

## 2021-03-11 DIAGNOSIS — M797 Fibromyalgia: Secondary | ICD-10-CM

## 2021-03-11 DIAGNOSIS — N951 Menopausal and female climacteric states: Secondary | ICD-10-CM

## 2021-03-11 LAB — HEPATITIS C ANTIBODY
Hepatitis C Ab: NONREACTIVE
SIGNAL TO CUT-OFF: 0.05 (ref <1.00)

## 2021-03-11 MED ORDER — POTASSIUM CHLORIDE CRYS ER 20 MEQ PO TBCR: 20.0000 meq | EXTENDED_RELEASE_TABLET | Freq: Every day | ORAL | 1 refills | Status: DC

## 2021-03-11 MED ORDER — VENLAFAXINE HCL ER 75 MG PO CP24: ORAL_CAPSULE | ORAL | 1 refills | Status: DC

## 2021-03-11 NOTE — Telephone Encounter (Signed)
..  [name] name:  Cassidy Hopkins  On DPR? :yes/no: No    Call back number:[phone]  Provider they see: Orland Mustard  Reason for call: Patient states that her Effexor was not called in yesterday and that she would like someone to call her and go over her lab results from her previous visit.

## 2021-03-14 NOTE — Telephone Encounter (Signed)
Patient medication was sent to the pharmacy 

## 2021-04-01 ENCOUNTER — Other Ambulatory Visit: Payer: Self-pay | Admitting: Registered Nurse

## 2021-04-01 DIAGNOSIS — G8929 Other chronic pain: Secondary | ICD-10-CM

## 2021-04-03 ENCOUNTER — Other Ambulatory Visit: Payer: Self-pay | Admitting: Registered Nurse

## 2021-04-03 DIAGNOSIS — F411 Generalized anxiety disorder: Secondary | ICD-10-CM

## 2021-04-04 MED ORDER — AMPHETAMINE-DEXTROAMPHETAMINE 10 MG PO TABS: 10.0000 mg | ORAL_TABLET | Freq: Three times a day (TID) | ORAL | 0 refills | Status: DC

## 2021-04-04 NOTE — Telephone Encounter (Signed)
LAST APPOINTMENT DATE: 03/06/2021   NEXT APPOINTMENT DATE: 04/03/2021

## 2021-04-25 ENCOUNTER — Other Ambulatory Visit: Payer: Self-pay | Admitting: Registered Nurse

## 2021-04-25 DIAGNOSIS — M5442 Lumbago with sciatica, left side: Secondary | ICD-10-CM

## 2021-04-25 DIAGNOSIS — G8929 Other chronic pain: Secondary | ICD-10-CM

## 2021-04-25 NOTE — Telephone Encounter (Signed)
Patient is requesting a refill of the following medications: Requested Prescriptions   Pending Prescriptions Disp Refills   cyclobenzaprine (FLEXERIL) 5 MG tablet [Pharmacy Med Name: CYCLOBENZAPRINE 5 MG TABLET] 30 tablet 1    Sig: TAKE 1 TABLET BY MOUTH THREE TIMES A DAY AS NEEDED FOR MUSCLE SPASMS    Date of patient request: 04/25/2021 Last office visit: 03/10/2021 Date of last refill: 04/03/2021 Last refill amount: 30 tablets  Follow up time period per chart: 06/09/2021

## 2021-04-29 ENCOUNTER — Other Ambulatory Visit: Payer: Self-pay | Admitting: Registered Nurse

## 2021-04-29 DIAGNOSIS — F411 Generalized anxiety disorder: Secondary | ICD-10-CM

## 2021-04-29 MED ORDER — AMPHETAMINE-DEXTROAMPHETAMINE 10 MG PO TABS: 10.0000 mg | ORAL_TABLET | Freq: Three times a day (TID) | ORAL | 0 refills | Status: DC

## 2021-04-29 MED ORDER — CLONAZEPAM 0.5 MG PO TABS: 0.2500 mg | ORAL_TABLET | Freq: Two times a day (BID) | ORAL | 0 refills | Status: DC | PRN

## 2021-05-02 ENCOUNTER — Other Ambulatory Visit: Payer: Self-pay | Admitting: Registered Nurse

## 2021-05-02 DIAGNOSIS — F411 Generalized anxiety disorder: Secondary | ICD-10-CM

## 2021-05-02 NOTE — Telephone Encounter (Signed)
Patient is requesting a refill of the following medications: Requested Prescriptions   Pending Prescriptions Disp Refills   clonazePAM (KLONOPIN) 0.5 MG tablet [Pharmacy Med Name: CLONAZEPAM 0.5 MG TABLET] 60 tablet 0    Sig: TAKE 0.5-1 TABLETS (0.25-0.5 MG TOTAL) BY MOUTH 2 (TWO) TIMES DAILY AS NEEDED FOR ANXIETY.    Date of patient request: 05/02/2021 Last office visit: 04/29/2021 Date of last refill: 04/29/2021 Last refill amount: 60 tablets Follow up time period per chart: 06/09/2021   Medication was already sent to the pharmacy 04/29/2021

## 2021-05-20 ENCOUNTER — Other Ambulatory Visit: Payer: Self-pay | Admitting: Registered Nurse

## 2021-05-20 DIAGNOSIS — G8929 Other chronic pain: Secondary | ICD-10-CM

## 2021-05-27 ENCOUNTER — Other Ambulatory Visit: Payer: Self-pay | Admitting: Registered Nurse

## 2021-05-27 DIAGNOSIS — F411 Generalized anxiety disorder: Secondary | ICD-10-CM

## 2021-05-27 MED ORDER — CLONAZEPAM 0.5 MG PO TABS: 0.2500 mg | ORAL_TABLET | Freq: Two times a day (BID) | ORAL | 0 refills | Status: DC | PRN

## 2021-05-27 MED ORDER — AMPHETAMINE-DEXTROAMPHETAMINE 10 MG PO TABS: 10.0000 mg | ORAL_TABLET | Freq: Three times a day (TID) | ORAL | 0 refills | Status: DC

## 2021-05-30 ENCOUNTER — Telehealth: Payer: Self-pay | Admitting: Registered Nurse

## 2021-05-30 NOTE — Telephone Encounter (Signed)
Pt called in stating that the adderall is needing a PA, please advise   Customer service # [phone]

## 2021-06-01 NOTE — Telephone Encounter (Signed)
Pt called in checking on this, she states that she is out of the medication today. Has the PA been started? Pt would like a call back with an update   Please advise

## 2021-06-02 NOTE — Telephone Encounter (Signed)
Patient PA has been approved . I have called and made the patient aware of this.

## 2021-06-06 ENCOUNTER — Institutional Professional Consult (permissible substitution): Payer: Federal, State, Local not specified - PPO | Admitting: Neurology

## 2021-06-06 ENCOUNTER — Other Ambulatory Visit: Payer: Self-pay | Admitting: Registered Nurse

## 2021-06-06 DIAGNOSIS — E039 Hypothyroidism, unspecified: Secondary | ICD-10-CM

## 2021-06-09 ENCOUNTER — Ambulatory Visit: Payer: Federal, State, Local not specified - PPO | Admitting: Registered Nurse

## 2021-06-09 ENCOUNTER — Other Ambulatory Visit: Payer: Self-pay | Admitting: Registered Nurse

## 2021-06-09 DIAGNOSIS — M5441 Lumbago with sciatica, right side: Secondary | ICD-10-CM

## 2021-06-09 DIAGNOSIS — G8929 Other chronic pain: Secondary | ICD-10-CM

## 2021-06-09 NOTE — Telephone Encounter (Signed)
Patient is requesting a refill of the following medications: Requested Prescriptions   Pending Prescriptions Disp Refills   cyclobenzaprine (FLEXERIL) 5 MG tablet [Pharmacy Med Name: CYCLOBENZAPRINE 5 MG TABLET] 30 tablet 1    Sig: TAKE 1 TABLET BY MOUTH THREE TIMES A DAY AS NEEDED FOR MUSCLE SPASMS    Date of patient request: 06/09/21 Last office visit: 03/10/21 Date of last refill: 05/20/21 Last refill amount: 30 1R Follow up time period per chart: 3 months

## 2021-06-10 ENCOUNTER — Ambulatory Visit
Admission: RE | Admit: 2021-06-10 | Discharge: 2021-06-10 | Disposition: A | Discharge status: Home / Self Care | Payer: Federal, State, Local not specified - PPO | Source: Ambulatory Visit | Attending: Registered Nurse | Admitting: Registered Nurse

## 2021-06-10 ENCOUNTER — Other Ambulatory Visit: Payer: Self-pay

## 2021-06-10 DIAGNOSIS — M5441 Lumbago with sciatica, right side: Secondary | ICD-10-CM

## 2021-06-10 DIAGNOSIS — G8929 Other chronic pain: Secondary | ICD-10-CM

## 2021-06-10 DIAGNOSIS — M545 Low back pain, unspecified: Secondary | ICD-10-CM | POA: Diagnosis not present

## 2021-06-19 ENCOUNTER — Other Ambulatory Visit: Payer: Self-pay | Admitting: Registered Nurse

## 2021-06-19 DIAGNOSIS — I10 Essential (primary) hypertension: Secondary | ICD-10-CM

## 2021-06-24 ENCOUNTER — Other Ambulatory Visit: Payer: Self-pay | Admitting: Registered Nurse

## 2021-06-24 DIAGNOSIS — F411 Generalized anxiety disorder: Secondary | ICD-10-CM

## 2021-06-24 MED ORDER — AMPHETAMINE-DEXTROAMPHETAMINE 10 MG PO TABS: 10.0000 mg | ORAL_TABLET | Freq: Three times a day (TID) | ORAL | 0 refills | Status: DC

## 2021-06-24 MED ORDER — CLONAZEPAM 0.5 MG PO TABS: 0.2500 mg | ORAL_TABLET | Freq: Two times a day (BID) | ORAL | 0 refills | Status: DC | PRN

## 2021-06-28 ENCOUNTER — Other Ambulatory Visit: Payer: Self-pay | Admitting: Registered Nurse

## 2021-06-28 DIAGNOSIS — M5441 Lumbago with sciatica, right side: Secondary | ICD-10-CM

## 2021-06-28 DIAGNOSIS — G8929 Other chronic pain: Secondary | ICD-10-CM

## 2021-07-01 ENCOUNTER — Telehealth: Payer: Self-pay | Admitting: Registered Nurse

## 2021-07-01 MED ORDER — METHYLPHENIDATE HCL 10 MG PO TABS: 10.0000 mg | ORAL_TABLET | Freq: Two times a day (BID) | ORAL | 0 refills | Status: DC

## 2021-07-01 NOTE — Telephone Encounter (Signed)
Pt called in asking if the adderall script can be changed to Ritalin. She states that this is what the pharmacy told her some pts are doing. Pt can be reached at the home # and is aware that Richard isn't in the office today.

## 2021-07-01 NOTE — Telephone Encounter (Signed)
Patient is aware and a follow up appt was made

## 2021-07-01 NOTE — Telephone Encounter (Signed)
I am willing to send in a month of Ritalin but she needs to know that these medication are different and may not work the same.  Also, she is overdue for a follow up appt and will not get any additional refills until she has an appt w/ Denice Paradise

## 2021-07-05 ENCOUNTER — Ambulatory Visit: Payer: Federal, State, Local not specified - PPO | Admitting: Registered Nurse

## 2021-07-07 ENCOUNTER — Telehealth (INDEPENDENT_AMBULATORY_CARE_PROVIDER_SITE_OTHER): Payer: Federal, State, Local not specified - PPO | Admitting: Registered Nurse

## 2021-07-07 ENCOUNTER — Encounter: Payer: Self-pay | Admitting: Registered Nurse

## 2021-07-07 DIAGNOSIS — E039 Hypothyroidism, unspecified: Secondary | ICD-10-CM

## 2021-07-07 DIAGNOSIS — E559 Vitamin D deficiency, unspecified: Secondary | ICD-10-CM

## 2021-07-07 DIAGNOSIS — E782 Mixed hyperlipidemia: Secondary | ICD-10-CM

## 2021-07-07 DIAGNOSIS — I10 Essential (primary) hypertension: Secondary | ICD-10-CM

## 2021-07-07 DIAGNOSIS — F909 Attention-deficit hyperactivity disorder, unspecified type: Secondary | ICD-10-CM

## 2021-07-07 DIAGNOSIS — F411 Generalized anxiety disorder: Secondary | ICD-10-CM

## 2021-07-07 DIAGNOSIS — M797 Fibromyalgia: Secondary | ICD-10-CM

## 2021-07-07 DIAGNOSIS — N951 Menopausal and female climacteric states: Secondary | ICD-10-CM

## 2021-07-07 DIAGNOSIS — E876 Hypokalemia: Secondary | ICD-10-CM

## 2021-07-07 DIAGNOSIS — M5441 Lumbago with sciatica, right side: Secondary | ICD-10-CM

## 2021-07-07 DIAGNOSIS — G8929 Other chronic pain: Secondary | ICD-10-CM

## 2021-07-07 DIAGNOSIS — R1907 Generalized intra-abdominal and pelvic swelling, mass and lump: Secondary | ICD-10-CM

## 2021-07-07 DIAGNOSIS — Z13228 Encounter for screening for other metabolic disorders: Secondary | ICD-10-CM

## 2021-07-07 MED ORDER — CLONAZEPAM 0.5 MG PO TABS: 0.2500 mg | ORAL_TABLET | Freq: Two times a day (BID) | ORAL | 0 refills | Status: DC | PRN

## 2021-07-07 MED ORDER — VENLAFAXINE HCL ER 75 MG PO CP24: ORAL_CAPSULE | ORAL | 1 refills | Status: DC

## 2021-07-07 MED ORDER — LEVOTHYROXINE SODIUM 100 MCG PO TABS: 100.0000 ug | ORAL_TABLET | Freq: Every day | ORAL | 1 refills | Status: DC

## 2021-07-07 MED ORDER — POTASSIUM CHLORIDE CRYS ER 20 MEQ PO TBCR: 20.0000 meq | EXTENDED_RELEASE_TABLET | Freq: Every day | ORAL | 1 refills | Status: DC

## 2021-07-07 MED ORDER — AMPHETAMINE-DEXTROAMPHETAMINE 10 MG PO TABS: 10.0000 mg | ORAL_TABLET | Freq: Three times a day (TID) | ORAL | 0 refills | Status: DC

## 2021-07-07 MED ORDER — HYDROCHLOROTHIAZIDE 25 MG PO TABS: 25.0000 mg | ORAL_TABLET | Freq: Every day | ORAL | 0 refills | Status: DC

## 2021-07-07 MED ORDER — ACETAZOLAMIDE 250 MG PO TABS: 250.0000 mg | ORAL_TABLET | Freq: Every day | ORAL | 0 refills | Status: DC

## 2021-07-07 NOTE — Patient Instructions (Signed)
Ms. Mrytle Bento to speak with you  Call if things get worse  See you in 3 mo to recheck on things  Lab orders are placed - can stop by here or [place] at the [place] lab M-F [id number]  Call ahead if you're going to stop by here!  Thanks,  Denice Paradise

## 2021-07-07 NOTE — Progress Notes (Signed)
Telemedicine Encounter- SOAP NOTE Established Patient  This video encounter was conducted with the patient's (or proxy's) verbal consent via audio telecommunications: yes/no: Yes Patient was instructed to have this encounter in a suitably private space; and to only have persons present to whom they give permission to participate. In addition, patient identity was confirmed by use of name plus two identifiers (DOB and address).  I discussed the limitations, risks, security and privacy concerns of performing an evaluation and management service by telephone and the availability of in person appointments. I also discussed with the patient that there may be a patient responsible charge related to this service. The patient expressed understanding and agreed to proceed.  I spent a total of 23 minutes talking with the patient or their proxy.  Patient at home Provider in office  Participants: Kathrin Ruddy, NP and Racheal Patches  No chief complaint on file.   Subjective   Cassidy Hopkins is a 46 y.o. established patient. 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 Doing well. Likes her job.  Notes worsening back pain. Lower right side back pain with R side sciatica.  Does well with flexeril, but does not relieve pain - just makes it bearable Does well with ibuprofen.  Feels like pain has worsened since last visit. Xray showed ddd l5-s1  Note mental health doing well. Better than she has in some time. No acute concerns with this. Would like to continue current regimen.  Notes abdominal mass. No GI symptoms or weight changes. No hx of mass or tumor.  Hx of multiple lipomas. Would like to check on this to see if this is what's occurring.  Last tsh elevated to 70. We had increased dose of synthroid, will recheck labs.  Otherwise, no further concerns.   Patient Active Problem List   Diagnosis Date Noted   ADHD (attention deficit  hyperactivity disorder), combined type 07/22/2020   Insomnia 05/08/2019   Right ureteral stone 11/04/2018   Adult ADHD 12/03/2017   Social anxiety disorder 12/03/2017   Multiple lipomas 07/07/2016   Generalized anxiety disorder 03/31/2016   Hypothyroidism 01/17/2016   Pseudotumor cerebri 01/17/2016   Obesity (BMI 30-39.9) 01/17/2016    Past Medical History:  Diagnosis Date   Adult ADHD    Anxiety    Depression    Hyperlipidemia    Hypertension    Irregular heart rate    Migraines    Panic attack    Thyroid disease     Current Outpatient Medications  Medication Sig Dispense Refill   acetaminophen (TYLENOL) 500 MG tablet Take 500 mg by mouth every 6 (six) hours as needed for mild pain, fever or headache.     acetaZOLAMIDE (DIAMOX) 250 MG tablet Take 1 tablet (250 mg total) by mouth daily. 90 tablet 0   amphetamine-dextroamphetamine (ADDERALL) 10 MG tablet Take 1 tablet (10 mg total) by mouth 3 (three) times daily. DUE for 3  Month f/u 90 tablet 0   clonazePAM (KLONOPIN) 0.5 MG tablet Take 0.5-1 tablets (0.25-0.5 mg total) by mouth 2 (two) times daily as needed for anxiety. 60 tablet 0   cyclobenzaprine (FLEXERIL) 5 MG tablet TAKE 1 TABLET BY MOUTH THREE TIMES A DAY AS NEEDED FOR MUSCLE SPASMS 30 tablet 1   hydrochlorothiazide (HYDRODIURIL) 25 MG tablet Take 1 tablet (25 mg total) by mouth daily. 90 tablet 0   levothyroxine (SYNTHROID) 100 MCG tablet Take 1 tablet (  100 mcg total) by mouth daily before breakfast. 90 tablet 1   potassium chloride SA (KLOR-CON M) 20 MEQ tablet Take 1 tablet (20 mEq total) by mouth daily. 90 tablet 1   venlafaxine XR (EFFEXOR-XR) 75 MG 24 hr capsule TAKE 1 CAPSULE BY MOUTH EVERY DAY WITH BREAKFAST 90 capsule 1   No current facility-administered medications for this visit.    Allergies  Allergen Reactions   Penicillins Rash and Shortness Of Breath   Topiramate Anxiety    Social History   Socioeconomic History   Marital status: Married     Spouse name: Not on file   Number of children: Not on file   Years of education: Not on file   Highest education level: Not on file  Occupational History   Not on file  Tobacco Use   Smoking status: Never   Smokeless tobacco: Never  Vaping Use   Vaping Use: Never used  Substance and Sexual Activity   Alcohol use: No   Drug use: No   Sexual activity: Not on file  Other Topics Concern   Not on file  Social History Narrative   Not on file   Social Determinants of Health   Financial Resource Strain: Not on file  Food Insecurity: Not on file  Transportation Needs: Not on file  Physical Activity: Not on file  Stress: Not on file  Social Connections: Not on file  Intimate Partner Violence: Not on file    ROS Per hpi   Objective   Vitals as reported by the patient: There were no vitals filed for this visit.  Diagnoses and all orders for this visit:  Adult ADHD -     amphetamine-dextroamphetamine (ADDERALL) 10 MG tablet; Take 1 tablet (10 mg total) by mouth 3 (three) times daily. DUE for 3  Month f/u  Primary hypertension -     acetaZOLAMIDE (DIAMOX) 250 MG tablet; Take 1 tablet (250 mg total) by mouth daily. -     hydrochlorothiazide (HYDRODIURIL) 25 MG tablet; Take 1 tablet (25 mg total) by mouth daily.  Hypokalemia -     potassium chloride SA (KLOR-CON M) 20 MEQ tablet; Take 1 tablet (20 mEq total) by mouth daily.  Acquired hypothyroidism -     levothyroxine (SYNTHROID) 100 MCG tablet; Take 1 tablet (100 mcg total) by mouth daily before breakfast.  Fibromyalgia muscle pain -     venlafaxine XR (EFFEXOR-XR) 75 MG 24 hr capsule; TAKE 1 CAPSULE BY MOUTH EVERY DAY WITH BREAKFAST  Perimenopausal -     venlafaxine XR (EFFEXOR-XR) 75 MG 24 hr capsule; TAKE 1 CAPSULE BY MOUTH EVERY DAY WITH BREAKFAST  Generalized anxiety disorder Comments: Stable. She is starting therapy tonight. I've asked her to check in with me to let me know how it went. Continue current treatment.   Orders: -     clonazePAM (KLONOPIN) 0.5 MG tablet; Take 0.5-1 tablets (0.25-0.5 mg total) by mouth 2 (two) times daily as needed for anxiety.  Chronic right-sided low back pain with right-sided sciatica -     MR Lumbar Spine Wo Contrast; Future  Generalized abdominal mass -     CT Abdomen Pelvis W Contrast; Future  Mixed hyperlipidemia  Screening for metabolic disorder -     TSH; Future -     Comprehensive metabolic panel; Future -     Hemoglobin A1c; Future  Vitamin D deficiency -     Vitamin D (25 hydroxy); Future    PLAN MRI lumbar spine to  check for nerve involvement  CT abdomen to investigate abdominal mass Repeat labs within a few weeks. Adjust medicaiton as warranted. TSH very elevated last time, will adjust synthroid to 112mcg po qd if warranted. Refill all meds x 3 mo Patient encouraged to call clinic with any questions, comments, or concerns.  I discussed the assessment and treatment plan with the patient. The patient was provided an opportunity to ask questions and all were answered. The patient agreed with the plan and demonstrated an understanding of the instructions.   The patient was advised to call back or seek an in-person evaluation if the symptoms worsen or if the condition fails to improve as anticipated.  I provided 23 minutes of face-to-face time during this encounter.  Maximiano Coss, NP

## 2021-07-14 ENCOUNTER — Other Ambulatory Visit: Payer: Federal, State, Local not specified - PPO

## 2021-07-15 ENCOUNTER — Other Ambulatory Visit: Payer: Self-pay | Admitting: Registered Nurse

## 2021-07-15 DIAGNOSIS — G8929 Other chronic pain: Secondary | ICD-10-CM

## 2021-07-18 ENCOUNTER — Telehealth: Payer: Self-pay | Admitting: *Deleted

## 2021-07-18 NOTE — Telephone Encounter (Signed)
I would recommend that she take 0.75mg  of her clonazepam about 1 hour before imaging.   Thank you,  Rich

## 2021-07-18 NOTE — Telephone Encounter (Signed)
Patient called and states that she is having imaging performed on Friday that was ordered by Maximiano Coss. She states that she is claustrophobic and they suggested that she reach out to request a medication that would relax her to have these performed. Please let her know when something has been called in to her pharmacy on file.

## 2021-07-19 NOTE — Telephone Encounter (Signed)
Attempted to call patient about medication. LVM to return call

## 2021-07-20 NOTE — Telephone Encounter (Signed)
Patient returned phone call. °

## 2021-07-21 NOTE — Telephone Encounter (Signed)
Returned patient call.

## 2021-07-22 ENCOUNTER — Ambulatory Visit (HOSPITAL_COMMUNITY)
Admission: RE | Admit: 2021-07-22 | Discharge: 2021-07-22 | Disposition: A | Discharge status: Home / Self Care | Payer: Federal, State, Local not specified - PPO | Source: Ambulatory Visit | Attending: Registered Nurse | Admitting: Registered Nurse

## 2021-07-22 ENCOUNTER — Other Ambulatory Visit: Payer: Self-pay

## 2021-07-22 DIAGNOSIS — M5441 Lumbago with sciatica, right side: Secondary | ICD-10-CM | POA: Insufficient documentation

## 2021-07-22 DIAGNOSIS — G8929 Other chronic pain: Secondary | ICD-10-CM | POA: Diagnosis not present

## 2021-07-22 DIAGNOSIS — M545 Low back pain, unspecified: Secondary | ICD-10-CM | POA: Diagnosis not present

## 2021-07-22 DIAGNOSIS — R1907 Generalized intra-abdominal and pelvic swelling, mass and lump: Secondary | ICD-10-CM | POA: Diagnosis not present

## 2021-07-22 DIAGNOSIS — R109 Unspecified abdominal pain: Secondary | ICD-10-CM | POA: Diagnosis not present

## 2021-07-22 DIAGNOSIS — M79604 Pain in right leg: Secondary | ICD-10-CM | POA: Diagnosis not present

## 2021-07-22 MED ORDER — SODIUM CHLORIDE (PF) 0.9 % IJ SOLN
INTRAMUSCULAR | Status: AC
  Filled 2021-07-22: qty 50

## 2021-07-22 MED ORDER — IOHEXOL 300 MG/ML  SOLN
100.0000 mL | Freq: Once | INTRAMUSCULAR | Status: AC | PRN
  Administered 2021-07-22: 100 mL via INTRAVENOUS

## 2021-08-01 ENCOUNTER — Other Ambulatory Visit: Payer: Self-pay | Admitting: Registered Nurse

## 2021-08-01 DIAGNOSIS — M5442 Lumbago with sciatica, left side: Secondary | ICD-10-CM

## 2021-08-01 DIAGNOSIS — G8929 Other chronic pain: Secondary | ICD-10-CM

## 2021-08-02 ENCOUNTER — Encounter: Payer: Self-pay | Admitting: Registered Nurse

## 2021-08-06 ENCOUNTER — Other Ambulatory Visit: Payer: Self-pay | Admitting: Registered Nurse

## 2021-08-06 DIAGNOSIS — I10 Essential (primary) hypertension: Secondary | ICD-10-CM

## 2021-08-08 ENCOUNTER — Other Ambulatory Visit: Payer: Self-pay | Admitting: Registered Nurse

## 2021-08-08 DIAGNOSIS — F909 Attention-deficit hyperactivity disorder, unspecified type: Secondary | ICD-10-CM

## 2021-08-08 DIAGNOSIS — F411 Generalized anxiety disorder: Secondary | ICD-10-CM

## 2021-08-08 MED ORDER — AMPHETAMINE-DEXTROAMPHETAMINE 10 MG PO TABS: 10.0000 mg | ORAL_TABLET | Freq: Three times a day (TID) | ORAL | 0 refills | Status: DC

## 2021-08-08 MED ORDER — CLONAZEPAM 0.5 MG PO TABS: 0.2500 mg | ORAL_TABLET | Freq: Two times a day (BID) | ORAL | 0 refills | Status: DC | PRN

## 2021-08-08 NOTE — Telephone Encounter (Signed)
Medication request duplicate ?

## 2021-08-08 NOTE — Telephone Encounter (Signed)
Patient is requesting a refill of the following medications: ?Requested Prescriptions  ? ?Pending Prescriptions Disp Refills  ? clonazePAM (KLONOPIN) 0.5 MG tablet 60 tablet 0  ?  Sig: Take 0.5-1 tablets (0.25-0.5 mg total) by mouth 2 (two) times daily as needed for anxiety.  ? ? ?Date of patient request: 08/08/2021 ?Last office visit: 07/07/2021 ?Date of last refill: 07/07/2021 ?Last refill amount: 60 tablets  ?Follow up time period per chart: none ? ?

## 2021-08-09 MED ORDER — AMPHETAMINE-DEXTROAMPHETAMINE 10 MG PO TABS: 10.0000 mg | ORAL_TABLET | Freq: Three times a day (TID) | ORAL | 0 refills | Status: DC

## 2021-08-12 ENCOUNTER — Telehealth: Payer: Self-pay

## 2021-08-12 NOTE — Telephone Encounter (Signed)
Encourage patient to contact the pharmacy for refills or they can request refills through [facility] ? ?(Please schedule appointment if patient has not been seen in over a year) ? ? ? ?WHAT PHARMACY WOULD THEY LIKE THIS SENT TO: [id number] [place] [facility] [address]  ? ?MEDICATION NAME & DOSE:Adderall 20 mg  ? ?NOTES/COMMENTS FROM PATIENT:Pt normally takes Adderall 10 mg  but the pharmacy does not know when they will be getting the '10mg'$  in asking if 20 mg can be sent in placed pt said that she has a pill cutter at home  ? ? ? ? ? ?[place] please notify patient: ?It takes 48-72 hours to process rx refill requests ?Ask patient to call pharmacy to ensure rx is ready before heading there.  ? ?

## 2021-08-12 NOTE — Telephone Encounter (Signed)
Patient states she has been a taking adderall 10 mg but the pharmacy do not know when they are getting that dosage she is asking if 20 mg and she has a pill splinter at will cut them in half  ?

## 2021-08-13 ENCOUNTER — Other Ambulatory Visit: Payer: Self-pay | Admitting: Registered Nurse

## 2021-08-13 DIAGNOSIS — F909 Attention-deficit hyperactivity disorder, unspecified type: Secondary | ICD-10-CM

## 2021-08-15 ENCOUNTER — Other Ambulatory Visit: Payer: Self-pay | Admitting: Registered Nurse

## 2021-08-15 NOTE — Telephone Encounter (Signed)
Patient called wanting to check the status of the prescription. Advised that our messages 48 hours to respond. Patient verbalized understanding.  ?

## 2021-08-15 NOTE — Telephone Encounter (Signed)
I have that she is on Adderall '10mg'$  po tid. Can we double check with her on dosing? ? ?Thanks, ? ?Rich

## 2021-08-16 NOTE — Telephone Encounter (Signed)
Pt states that she is on the 10 mgs but since the shortage she would like the 20 mg and she would cut them in half. She states she only got 8 days worth of a script. She states that this is what the pharmacy told her to ask for.  ? ?Please advise  ?

## 2021-08-17 ENCOUNTER — Other Ambulatory Visit: Payer: Self-pay | Admitting: Registered Nurse

## 2021-08-17 DIAGNOSIS — F909 Attention-deficit hyperactivity disorder, unspecified type: Secondary | ICD-10-CM

## 2021-08-17 MED ORDER — AMPHETAMINE-DEXTROAMPHETAMINE 20 MG PO TABS: 10.0000 mg | ORAL_TABLET | Freq: Three times a day (TID) | ORAL | 0 refills | Status: DC

## 2021-08-17 NOTE — Telephone Encounter (Signed)
Pt aware ELEA  ?

## 2021-08-17 NOTE — Telephone Encounter (Signed)
My bad - misunderstood. Have sent '20mg'$  tabs to take 1/2 tab ('10mg'$  ) po tid. Sent 30 day supply (45 tabs) ? ?Thanks, ? ?Rich

## 2021-08-21 ENCOUNTER — Other Ambulatory Visit: Payer: Self-pay | Admitting: Registered Nurse

## 2021-08-21 DIAGNOSIS — G8929 Other chronic pain: Secondary | ICD-10-CM

## 2021-09-10 ENCOUNTER — Other Ambulatory Visit: Payer: Self-pay | Admitting: Registered Nurse

## 2021-09-10 DIAGNOSIS — F411 Generalized anxiety disorder: Secondary | ICD-10-CM

## 2021-09-10 DIAGNOSIS — G8929 Other chronic pain: Secondary | ICD-10-CM

## 2021-09-30 ENCOUNTER — Other Ambulatory Visit: Payer: Self-pay | Admitting: Registered Nurse

## 2021-09-30 DIAGNOSIS — F909 Attention-deficit hyperactivity disorder, unspecified type: Secondary | ICD-10-CM

## 2021-09-30 DIAGNOSIS — F411 Generalized anxiety disorder: Secondary | ICD-10-CM

## 2021-10-03 ENCOUNTER — Other Ambulatory Visit: Payer: Self-pay | Admitting: Registered Nurse

## 2021-10-03 DIAGNOSIS — G8929 Other chronic pain: Secondary | ICD-10-CM

## 2021-10-03 MED ORDER — CLONAZEPAM 0.5 MG PO TABS: 0.5000 mg | ORAL_TABLET | Freq: Three times a day (TID) | ORAL | 0 refills | Status: DC | PRN

## 2021-10-03 MED ORDER — AMPHETAMINE-DEXTROAMPHETAMINE 20 MG PO TABS: 10.0000 mg | ORAL_TABLET | Freq: Three times a day (TID) | ORAL | 0 refills | Status: DC

## 2021-10-03 NOTE — Telephone Encounter (Signed)
Patient is requesting a refill of the following medications: ?Requested Prescriptions  ? ?Pending Prescriptions Disp Refills  ? amphetamine-dextroamphetamine (ADDERALL) 20 MG tablet 45 tablet 0  ?  Sig: Take 0.5 tablets (10 mg total) by mouth 3 (three) times daily.  ? clonazePAM (KLONOPIN) 0.5 MG tablet 60 tablet 0  ? ? ?Date of patient request: 09/30/21 ?Last office visit: 07/07/21 ?Date of last refill: 08/17/21, 09/12/21 ?Last refill amount: 45, 60 ?Follow up time period per chart: 3 months ? ?

## 2021-10-07 ENCOUNTER — Other Ambulatory Visit: Payer: Self-pay | Admitting: Registered Nurse

## 2021-10-07 DIAGNOSIS — F411 Generalized anxiety disorder: Secondary | ICD-10-CM

## 2021-10-10 NOTE — Telephone Encounter (Signed)
Patient is requesting a refill of the following medications: Requested Prescriptions   Pending Prescriptions Disp Refills   clonazePAM (KLONOPIN) 0.5 MG tablet 60 tablet 0    Sig: Take 1 tablet (0.5 mg total) by mouth 3 (three) times daily as needed for anxiety.    Date of patient request: 10/07/2021 Last office visit: 07/07/2021 Date of last refill: 10/03/2021 Last refill amount: 60 tablets  Follow up time period per chart: n/a

## 2021-10-11 MED ORDER — CLONAZEPAM 0.5 MG PO TABS: 0.5000 mg | ORAL_TABLET | Freq: Three times a day (TID) | ORAL | 0 refills | Status: DC | PRN

## 2021-10-25 ENCOUNTER — Other Ambulatory Visit: Payer: Self-pay | Admitting: Registered Nurse

## 2021-10-25 DIAGNOSIS — F909 Attention-deficit hyperactivity disorder, unspecified type: Secondary | ICD-10-CM

## 2021-10-25 DIAGNOSIS — G8929 Other chronic pain: Secondary | ICD-10-CM

## 2021-10-25 DIAGNOSIS — F411 Generalized anxiety disorder: Secondary | ICD-10-CM

## 2021-10-25 MED ORDER — AMPHETAMINE-DEXTROAMPHETAMINE 20 MG PO TABS: 10.0000 mg | ORAL_TABLET | Freq: Three times a day (TID) | ORAL | 0 refills | Status: DC

## 2021-10-25 MED ORDER — CLONAZEPAM 0.5 MG PO TABS: 0.5000 mg | ORAL_TABLET | Freq: Three times a day (TID) | ORAL | 0 refills | Status: DC | PRN

## 2021-11-07 ENCOUNTER — Other Ambulatory Visit: Payer: Self-pay | Admitting: Registered Nurse

## 2021-11-07 DIAGNOSIS — F411 Generalized anxiety disorder: Secondary | ICD-10-CM

## 2021-11-21 ENCOUNTER — Other Ambulatory Visit: Payer: Self-pay | Admitting: Registered Nurse

## 2021-11-21 DIAGNOSIS — G8929 Other chronic pain: Secondary | ICD-10-CM

## 2021-11-27 ENCOUNTER — Other Ambulatory Visit: Payer: Self-pay | Admitting: Registered Nurse

## 2021-11-27 DIAGNOSIS — F909 Attention-deficit hyperactivity disorder, unspecified type: Secondary | ICD-10-CM

## 2021-11-28 ENCOUNTER — Other Ambulatory Visit: Payer: Self-pay | Admitting: Registered Nurse

## 2021-11-28 DIAGNOSIS — F909 Attention-deficit hyperactivity disorder, unspecified type: Secondary | ICD-10-CM

## 2021-11-28 MED ORDER — AMPHETAMINE-DEXTROAMPHETAMINE 20 MG PO TABS: 10.0000 mg | ORAL_TABLET | Freq: Three times a day (TID) | ORAL | 0 refills | Status: DC

## 2021-11-28 NOTE — Telephone Encounter (Signed)
Last visit 07/07/21. PDMP consulted, last fill 10/31/21 for 30 day supply (#45) no refill.  Due for visit in August  Kathrin Ruddy, NP

## 2021-12-13 ENCOUNTER — Other Ambulatory Visit: Payer: Self-pay | Admitting: Registered Nurse

## 2021-12-13 DIAGNOSIS — F411 Generalized anxiety disorder: Secondary | ICD-10-CM

## 2021-12-13 NOTE — Telephone Encounter (Signed)
Patient is requesting a refill of the following medications: Requested Prescriptions   Pending Prescriptions Disp Refills   clonazePAM (KLONOPIN) 0.5 MG tablet [Pharmacy Med Name: CLONAZEPAM 0.5 MG TABLET] 60 tablet 0    Sig: Take 1 tablet (0.5 mg total) by mouth 3 (three) times daily as needed for anxiety.    Date of patient request: 12/13/21 Last office visit: 07/07/21 Date of last refill: 10/30/21 Last refill amount: 60

## 2021-12-17 ENCOUNTER — Other Ambulatory Visit: Payer: Self-pay | Admitting: Registered Nurse

## 2021-12-17 DIAGNOSIS — G8929 Other chronic pain: Secondary | ICD-10-CM

## 2021-12-23 ENCOUNTER — Other Ambulatory Visit: Payer: Self-pay | Admitting: Registered Nurse

## 2021-12-23 DIAGNOSIS — F909 Attention-deficit hyperactivity disorder, unspecified type: Secondary | ICD-10-CM

## 2021-12-23 NOTE — Telephone Encounter (Signed)
Patient is requesting a refill of the following medications: Requested Prescriptions   Pending Prescriptions Disp Refills   amphetamine-dextroamphetamine (ADDERALL) 20 MG tablet 45 tablet 0    Sig: Take 0.5 tablets (10 mg total) by mouth 3 (three) times daily.    Date of patient request: 12/23/2021 Last office visit: 07/01/2021 Date of last refill: 11/28/2021 Last refill amount: 45 tablets Follow up time period per chart: n/a

## 2021-12-26 MED ORDER — AMPHETAMINE-DEXTROAMPHETAMINE 20 MG PO TABS: 10.0000 mg | ORAL_TABLET | Freq: Three times a day (TID) | ORAL | 0 refills | Status: DC

## 2021-12-28 ENCOUNTER — Encounter (INDEPENDENT_AMBULATORY_CARE_PROVIDER_SITE_OTHER): Payer: Self-pay

## 2022-01-18 ENCOUNTER — Other Ambulatory Visit: Payer: Self-pay | Admitting: Family Medicine

## 2022-01-18 DIAGNOSIS — F411 Generalized anxiety disorder: Secondary | ICD-10-CM

## 2022-01-18 DIAGNOSIS — F909 Attention-deficit hyperactivity disorder, unspecified type: Secondary | ICD-10-CM

## 2022-01-19 MED ORDER — CLONAZEPAM 0.5 MG PO TABS: 0.5000 mg | ORAL_TABLET | Freq: Three times a day (TID) | ORAL | 0 refills | Status: DC | PRN

## 2022-01-19 MED ORDER — AMPHETAMINE-DEXTROAMPHETAMINE 20 MG PO TABS: 10.0000 mg | ORAL_TABLET | Freq: Three times a day (TID) | ORAL | 0 refills | Status: DC

## 2022-01-20 ENCOUNTER — Encounter: Payer: Self-pay | Admitting: Family Medicine

## 2022-01-20 ENCOUNTER — Ambulatory Visit: Payer: Federal, State, Local not specified - PPO | Admitting: Family Medicine

## 2022-01-20 VITALS — BP 136/88 | HR 96 | Temp 97.9°F | Resp 16 | Ht 66.0 in | Wt 243.0 lb

## 2022-01-20 DIAGNOSIS — M5442 Lumbago with sciatica, left side: Secondary | ICD-10-CM | POA: Diagnosis not present

## 2022-01-20 DIAGNOSIS — N6320 Unspecified lump in the left breast, unspecified quadrant: Secondary | ICD-10-CM

## 2022-01-20 DIAGNOSIS — G8929 Other chronic pain: Secondary | ICD-10-CM

## 2022-01-20 DIAGNOSIS — N6310 Unspecified lump in the right breast, unspecified quadrant: Secondary | ICD-10-CM

## 2022-01-20 DIAGNOSIS — I10 Essential (primary) hypertension: Secondary | ICD-10-CM

## 2022-01-20 DIAGNOSIS — M5441 Lumbago with sciatica, right side: Secondary | ICD-10-CM

## 2022-01-20 MED ORDER — HYDROCHLOROTHIAZIDE 25 MG PO TABS: 25.0000 mg | ORAL_TABLET | Freq: Every day | ORAL | 0 refills | Status: DC

## 2022-01-20 MED ORDER — CYCLOBENZAPRINE HCL 5 MG PO TABS: ORAL_TABLET | ORAL | 1 refills | Status: DC

## 2022-01-20 NOTE — Progress Notes (Incomplete)
   Subjective:    Patient ID: Cassidy Hopkins, female    DOB: 1975/06/16, 46 y.o.   MRN: [id number]  HPI Breast lumps- pt does regular self breast exams in the shower.  3 days ago felt lump in R breast through her shirt (after dropping food on shirt).  Subsequently found 3 lumps on R side.  Found 1 lump on L side- larger than lumps on R.  Areas are TTP.  No skin changes.  No nipple d/c.  Pt has hx of 'fatty tumors'- had a biopsy that was benign.  Maternal side of family has extensive hx of cancer.  Last mammo 13 months ago- [facility] ([place])   Review of Systems For ROS see HPI     Objective:   Physical Exam        Assessment & Plan:

## 2022-01-20 NOTE — Patient Instructions (Addendum)
Follow up as needed or as scheduled We'll call you to schedule your diagnostic mammogram at [place] I know it's so hard but try not to freak out!!  We're going to hope for the best!! Call with any questions or concerns Hang in there!!!

## 2022-01-23 ENCOUNTER — Encounter: Payer: Self-pay | Admitting: Family Medicine

## 2022-01-24 ENCOUNTER — Telehealth: Payer: Self-pay | Admitting: Registered Nurse

## 2022-01-24 DIAGNOSIS — F909 Attention-deficit hyperactivity disorder, unspecified type: Secondary | ICD-10-CM

## 2022-01-24 MED ORDER — AMPHETAMINE-DEXTROAMPHETAMINE 10 MG PO TABS: 10.0000 mg | ORAL_TABLET | Freq: Three times a day (TID) | ORAL | 0 refills | Status: DC

## 2022-01-24 NOTE — Telephone Encounter (Signed)
Prescription Refill or Medication Request (non symptomatic) Reason for Call Symptomatic / Request for Health Information Initial Comment [name] states she was seen yesterday and had some prescriptions refilled. One of the prescriptions was for Adderall. She only takes '10mg'$  but the rx sent was for '20mg'$ . Translation No Disp. Time Eilene Ghazi Time) Disposition Final User 01/21/2022 4:10:02 PM Attempt made - no message left Laqueta Due, RN, [employer] 01/21/2022 4:25:37 PM FINAL ATTEMPT MADE - no message left Laqueta Due RN, Amber 01/21/2022 4:25:42 PM Send to RN Final Attempt Orpah Melter, RN, Amber 01/22/2022 12:40:04 AM Attempt made - no message left Scales, RN, [place]ah Loop 01/22/2022 12:45:30 AM FINAL ATTEMPT MADE - no message left Yes Scales, RN, Debroah Loop Final Disposition 01/22/2022 12:45:30 AM FINAL ATTEMPT MADE - no message left

## 2022-01-24 NOTE — Telephone Encounter (Signed)
Prescription changed to '10mg'$  TID as requested

## 2022-01-25 ENCOUNTER — Other Ambulatory Visit: Payer: Self-pay

## 2022-01-25 DIAGNOSIS — N6310 Unspecified lump in the right breast, unspecified quadrant: Secondary | ICD-10-CM

## 2022-01-25 DIAGNOSIS — N644 Mastodynia: Secondary | ICD-10-CM | POA: Diagnosis not present

## 2022-01-25 DIAGNOSIS — N6313 Unspecified lump in the right breast, lower outer quadrant: Secondary | ICD-10-CM | POA: Diagnosis not present

## 2022-01-25 DIAGNOSIS — R922 Inconclusive mammogram: Secondary | ICD-10-CM | POA: Diagnosis not present

## 2022-01-25 DIAGNOSIS — N6325 Unspecified lump in the left breast, overlapping quadrants: Secondary | ICD-10-CM | POA: Diagnosis not present

## 2022-01-25 DIAGNOSIS — N6320 Unspecified lump in the left breast, unspecified quadrant: Secondary | ICD-10-CM

## 2022-01-30 ENCOUNTER — Telehealth: Payer: Self-pay | Admitting: Registered Nurse

## 2022-01-30 DIAGNOSIS — N6320 Unspecified lump in the left breast, unspecified quadrant: Secondary | ICD-10-CM

## 2022-01-30 DIAGNOSIS — N6310 Unspecified lump in the right breast, unspecified quadrant: Secondary | ICD-10-CM

## 2022-01-30 NOTE — Telephone Encounter (Signed)
I placed these orders (or what I hope are the correct orders).  Typically the other breast centers we work with enter the order (either electronically or they fax it to me) and I sign it.  In the future, it would be easier for them to send me exactly what they want and I will sign off on it

## 2022-01-30 NOTE — Telephone Encounter (Signed)
[name] name: [place]  On DPR? :yes/no: No  Call back number: [phone]  Provider they see: Maximiano Coss  Reason for call:  pt has apptmt tomorrow morning; needs rt ultrasound biopsy and fine needle aspiration and rt cyst aspiration.  Fax # [phone]

## 2022-01-30 NOTE — Telephone Encounter (Signed)
Faxed

## 2022-01-30 NOTE — Telephone Encounter (Signed)
Could you put in the orders so that I may fax these back in time for procedure

## 2022-01-31 DIAGNOSIS — D241 Benign neoplasm of right breast: Secondary | ICD-10-CM | POA: Diagnosis not present

## 2022-01-31 DIAGNOSIS — N6313 Unspecified lump in the right breast, lower outer quadrant: Secondary | ICD-10-CM | POA: Diagnosis not present

## 2022-02-11 ENCOUNTER — Other Ambulatory Visit: Payer: Self-pay | Admitting: Family Medicine

## 2022-02-11 DIAGNOSIS — G8929 Other chronic pain: Secondary | ICD-10-CM

## 2022-02-12 ENCOUNTER — Ambulatory Visit
Admission: EM | Admit: 2022-02-12 | Discharge: 2022-02-12 | Disposition: A | Discharge status: Home / Self Care | Payer: Federal, State, Local not specified - PPO | Attending: Emergency Medicine | Admitting: Emergency Medicine

## 2022-02-12 ENCOUNTER — Other Ambulatory Visit: Payer: Self-pay

## 2022-02-12 DIAGNOSIS — J0100 Acute maxillary sinusitis, unspecified: Secondary | ICD-10-CM

## 2022-02-12 LAB — POCT RAPID STREP A (OFFICE): Rapid Strep A Screen: NEGATIVE

## 2022-02-12 MED ORDER — DOXYCYCLINE HYCLATE 100 MG PO CAPS: 100.0000 mg | ORAL_CAPSULE | Freq: Two times a day (BID) | ORAL | 0 refills | Status: AC

## 2022-02-12 MED ORDER — IPRATROPIUM BROMIDE 0.06 % NA SOLN: 2.0000 | Freq: Four times a day (QID) | NASAL | 0 refills | Status: DC

## 2022-02-12 MED ORDER — PROMETHAZINE-DM 6.25-15 MG/5ML PO SYRP: 5.0000 mL | ORAL_SOLUTION | Freq: Four times a day (QID) | ORAL | 0 refills | Status: DC | PRN

## 2022-02-12 NOTE — Discharge Instructions (Addendum)
Strep is negative.  Finish doxycycline, even if you feel better.  Atrovent nasal spray will help with the nasal congestion and postnasal drip.  Start Mucinex D, drink plenty of extra fluids.  Saline nasal irrigation with a Milta Deiters Med rinse and distilled water as often as you want.  Promethazine DM for cough.

## 2022-02-12 NOTE — ED Triage Notes (Signed)
Pt presents to Urgent Care with c/o sore throat, cough, and nasal congestion x 9 days. States she had a low-grade fever at onset of s/s. Has not done a COVID test.

## 2022-02-12 NOTE — Progress Notes (Signed)
   Subjective:    Patient ID: Cassidy Hopkins, female    DOB: Mar 21, 1976, 46 y.o.   MRN: [id number]  HPI Breast lumps- pt does regular self breast exams in the shower.  3 days ago felt lump in R breast through her shirt (after dropping food on shirt).  Subsequently found 3 lumps on R side.  Found 1 lump on L side- larger than lumps on R.  Areas are TTP.  No skin changes.  No nipple d/c.  Pt has hx of 'fatty tumors'- had a biopsy that was benign.  Maternal side of family has extensive hx of cancer.  Last mammo 13 months ago- [facility] ([place])   Review of Systems For ROS see HPI     Objective:   Physical Exam AAOx3, NAD Obese Understandably anxious L breast- firm but freely mobile mass medially, TTP R breast- firm but freely mobile masses medially and lower outer quadrant, TTP       Assessment & Plan:  Breast masses bilaterally- new.  Pt is understandably concerned given family hx of cancer.  UTD on mammo.  Will get diagnostic imaging.  Pt expressed understanding and is in agreement w/ plan.   HTN- refill provided on HCTZ  Back pain- refill provided on Flexeril

## 2022-02-12 NOTE — ED Provider Notes (Signed)
HPI  SUBJECTIVE:  Cassidy Hopkins is a 46 y.o. female who presents with 9 days of sore throat, clear rhinorrhea, cough productive of foul tasting, dark gray sputum, cervical lymphadenopathy, fatigue, sinus pain and pressure, postnasal drip, and ear itching.  No ear pain..  She is unable to sleep at night secondary to the cough.  No fevers above 100.4, upper dental pain, facial swelling, wheezing, shortness of breath.  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  She has had 3 doses of the COVID-vaccine, has not yet gotten the flu vaccine.  She tried cold medication with improvement in her symptoms.  Symptoms are worse when she is up and about.  She has a past medical history of hypothyroidism, and states that she is on hydrochlorothiazide for fluid retention.  PCP: [facility].    Past Medical History:  Diagnosis Date   Adult ADHD    Anxiety    Depression    Hyperlipidemia    Hypertension    Irregular heart rate    Migraines    Panic attack    Thyroid disease     Past Surgical History:  Procedure Laterality Date   BREAST SURGERY     CESAREAN SECTION  2009, 2014, 2016   CHOLECYSTECTOMY  1997    Family History  Problem Relation Age of Onset   Hypertension Mother    Hyperlipidemia Mother    Hyperlipidemia Father    Heart disease Father    Hypertension Father    Diabetes Father    Colon cancer Maternal Grandmother    Hyperlipidemia Maternal Grandmother     Social History   Tobacco Use   Smoking status: Never   Smokeless tobacco: Never  Vaping Use   Vaping Use: Never used  Substance Use Topics   Alcohol use: No   Drug use: No    No current facility-administered medications for this encounter.  Current Outpatient Medications:    doxycycline (VIBRAMYCIN) 100 MG capsule, Take 1 capsule (100 mg total) by mouth 2 (two) times daily for 10 days., Disp: 20 capsule, Rfl: 0   ipratropium (ATROVENT) 0.06 % nasal spray, Place 2 sprays into both  nostrils 4 (four) times daily., Disp: 15 mL, Rfl: 0   promethazine-dextromethorphan (PROMETHAZINE-DM) 6.25-15 MG/5ML syrup, Take 5 mLs by mouth 4 (four) times daily as needed for cough., Disp: 118 mL, Rfl: 0   acetaZOLAMIDE (DIAMOX) 250 MG tablet, TAKE 1 TABLET BY MOUTH EVERY DAY, Disp: 90 tablet, Rfl: 0   amphetamine-dextroamphetamine (ADDERALL) 10 MG tablet, Take 1 tablet (10 mg total) by mouth 3 (three) times daily., Disp: 90 tablet, Rfl: 0   clonazePAM (KLONOPIN) 0.5 MG tablet, Take 1 tablet (0.5 mg total) by mouth 3 (three) times daily as needed for anxiety., Disp: 60 tablet, Rfl: 0   cyclobenzaprine (FLEXERIL) 5 MG tablet, TAKE 1 TABLET BY MOUTH THREE TIMES A DAY AS NEEDED FOR MUSCLE SPASM, Disp: 30 tablet, Rfl: 1   hydrochlorothiazide (HYDRODIURIL) 25 MG tablet, Take 1 tablet (25 mg total) by mouth daily., Disp: 90 tablet, Rfl: 0   levothyroxine (SYNTHROID) 100 MCG tablet, Take 1 tablet (100 mcg total) by mouth daily before breakfast., Disp: 90 tablet, Rfl: 1   venlafaxine XR (EFFEXOR-XR) 75 MG 24 hr capsule, TAKE 1 CAPSULE BY MOUTH EVERY DAY WITH BREAKFAST, Disp: 90 capsule, Rfl: 1  Allergies  Allergen Reactions   Penicillin G Anaphylaxis   Penicillins Rash and Shortness Of Breath   Topiramate Anxiety  ROS  As noted in HPI.   Physical Exam  BP 124/86 (BP Location: Right Arm)   Pulse 88   Temp 98 F (36.7 C) (Oral)   Resp 20   Ht 5' 6.5" (1.689 m)   Wt 108.9 kg   LMP 01/22/2022 (Approximate)   SpO2 99%   BMI 38.16 kg/m   Constitutional: Well developed, well nourished, no acute distress Eyes:  EOMI, conjunctiva normal bilaterally HENT: Normocephalic, atraumatic,mucus membranes moist.  TMs normal bilaterally.  Positive clear nasal congestion.  Positive maxillary sinus tenderness bilaterally.  Erythematous oropharynx, tonsils without exudates.  Uvula midline. Neck: Positive tender left-sided cervical lymphadenopathy Respiratory: Normal inspiratory effort, lungs clear  bilaterally Cardiovascular: Normal rate, regular rhythm, no murmurs rubs or gallops GI: nondistended skin: No rash, skin intact Musculoskeletal: no deformities Neurologic: Alert & oriented x 3, no focal neuro deficits Psychiatric: Speech and behavior appropriate   ED Course   Medications - No data to display  Orders Placed This Encounter  Procedures   POCT rapid strep A    Standing Status:   Standing    Number of Occurrences:   1    Results for orders placed or performed during the hospital encounter of 02/12/22 (from the past 24 hour(s))  POCT rapid strep A     Status: None   Collection Time: 02/12/22 12:04 PM  Result Value Ref Range   Rapid Strep A Screen Negative Negative   No results found.  ED Clinical Impression  1. Acute non-recurrent maxillary sinusitis      ED Assessment/Plan      Sore throat for 9 days and left-sided lymphadenopathy which is new.  Checking strep.  Deferring COVID, flu testing because she is out of the treatment /quarantine window.  Strep negative.  Will treat as a sinusitis with Mucinex D, doxycycline for 10 days, Atrovent nasal spray, saline nasal irrigation, Promethazine DM to help her sleep.  Follow-up with PCP as needed.   Discussed labs, MDM, treatment plan, and plan for follow-up with patient. patient agrees with plan.   Meds ordered this encounter  Medications   doxycycline (VIBRAMYCIN) 100 MG capsule    Sig: Take 1 capsule (100 mg total) by mouth 2 (two) times daily for 10 days.    Dispense:  20 capsule    Refill:  0   ipratropium (ATROVENT) 0.06 % nasal spray    Sig: Place 2 sprays into both nostrils 4 (four) times daily.    Dispense:  15 mL    Refill:  0   promethazine-dextromethorphan (PROMETHAZINE-DM) 6.25-15 MG/5ML syrup    Sig: Take 5 mLs by mouth 4 (four) times daily as needed for cough.    Dispense:  118 mL    Refill:  0      *This clinic note was created using [employer]. Therefore, there may  be occasional mistakes despite careful proofreading.  ?    Melynda Ripple, MD 02/13/22 [phone]

## 2022-02-13 ENCOUNTER — Other Ambulatory Visit: Payer: Self-pay | Admitting: Family

## 2022-02-13 DIAGNOSIS — F411 Generalized anxiety disorder: Secondary | ICD-10-CM

## 2022-02-14 ENCOUNTER — Other Ambulatory Visit: Payer: Self-pay

## 2022-02-14 DIAGNOSIS — M797 Fibromyalgia: Secondary | ICD-10-CM

## 2022-02-14 DIAGNOSIS — N951 Menopausal and female climacteric states: Secondary | ICD-10-CM

## 2022-02-14 MED ORDER — VENLAFAXINE HCL ER 75 MG PO CP24: ORAL_CAPSULE | ORAL | 1 refills | Status: DC

## 2022-02-20 ENCOUNTER — Telehealth: Payer: Self-pay | Admitting: Registered Nurse

## 2022-02-20 DIAGNOSIS — F411 Generalized anxiety disorder: Secondary | ICD-10-CM

## 2022-02-20 DIAGNOSIS — G8929 Other chronic pain: Secondary | ICD-10-CM

## 2022-02-20 DIAGNOSIS — I10 Essential (primary) hypertension: Secondary | ICD-10-CM

## 2022-02-20 MED ORDER — CLONAZEPAM 0.5 MG PO TABS: 0.5000 mg | ORAL_TABLET | Freq: Three times a day (TID) | ORAL | 0 refills | Status: DC | PRN

## 2022-02-20 MED ORDER — CYCLOBENZAPRINE HCL 5 MG PO TABS: ORAL_TABLET | ORAL | 0 refills | Status: DC

## 2022-02-20 MED ORDER — AMPHETAMINE-DEXTROAMPHETAMINE 10 MG PO TABS: 10.0000 mg | ORAL_TABLET | Freq: Three times a day (TID) | ORAL | 0 refills | Status: DC

## 2022-02-20 MED ORDER — ACETAZOLAMIDE 250 MG PO TABS: 250.0000 mg | ORAL_TABLET | Freq: Every day | ORAL | 0 refills | Status: DC

## 2022-02-20 NOTE — Telephone Encounter (Signed)
Is it ok to refill these Rx's ? Last OV was 01/20/22 She has been advised she will need to est care with a new provider before next refills

## 2022-02-20 NOTE — Telephone Encounter (Signed)
Prescriptions sent and notes written on scripts indicating pt needs new provider

## 2022-02-20 NOTE — Telephone Encounter (Signed)
Encourage patient to contact the pharmacy for refills or they can request refills through [facility]  (Please schedule appointment if patient has not been seen in over a year)    WHAT PHARMACY WOULD THEY LIKE [facility] [address] [phone]  MEDICATION NAME & DOSE: diamox 250 mg AND clonazepam  0.'50mg'$  AND adderall 10 mg AND cyclobenaprine 5 mg  NOTES/COMMENTS FROM PATIENT: pt aware that sheeds to establish with new PCP; will call [place] office please notify patient: It takes 48-72 hours to process rx refill requests Ask patient to call pharmacy to ensure rx is ready before heading there.

## 2022-02-20 NOTE — Telephone Encounter (Signed)
Called pt and informed and ensureed she does have resources to get to a new provider discussed this this morning with reception team

## 2022-03-14 ENCOUNTER — Other Ambulatory Visit: Payer: Self-pay | Admitting: Family Medicine

## 2022-03-14 ENCOUNTER — Telehealth: Payer: Self-pay

## 2022-03-14 DIAGNOSIS — I10 Essential (primary) hypertension: Secondary | ICD-10-CM

## 2022-03-14 NOTE — Telephone Encounter (Signed)
Pharmacy sent a refill request for her Acetazolamide . I spoke to the pt and she states she did need a refill and she is calling [place] to est care

## 2022-03-17 ENCOUNTER — Other Ambulatory Visit: Payer: Self-pay | Admitting: Family Medicine

## 2022-03-17 DIAGNOSIS — F411 Generalized anxiety disorder: Secondary | ICD-10-CM

## 2022-03-17 DIAGNOSIS — G8929 Other chronic pain: Secondary | ICD-10-CM

## 2022-03-17 NOTE — Telephone Encounter (Signed)
Patient is requesting a refill of the following medications: Requested Prescriptions   Pending Prescriptions Disp Refills   clonazePAM (KLONOPIN) 0.5 MG tablet [Pharmacy Med Name: CLONAZEPAM 0.5 MG TABLET] 60 tablet 0    Sig: Take 1 tablet (0.5 mg total) by mouth 3 (three) times daily as needed for anxiety.   cyclobenzaprine (FLEXERIL) 5 MG tablet [Pharmacy Med Name: CYCLOBENZAPRINE 5 MG TABLET] 30 tablet 0    Sig: TAKE 1 TABLET BY MOUTH THREE TIMES A DAY AS NEEDED FOR MUSCLE SPASM.  Need to find new provider    Date of patient request: 03/17/22 Last office visit: 01/20/22 Date of last refill: 02/20/22 Last refill amount: 60, 30

## 2022-03-18 ENCOUNTER — Other Ambulatory Visit: Payer: Self-pay | Admitting: Family Medicine

## 2022-03-18 DIAGNOSIS — G8929 Other chronic pain: Secondary | ICD-10-CM

## 2022-03-18 DIAGNOSIS — F411 Generalized anxiety disorder: Secondary | ICD-10-CM

## 2022-03-20 ENCOUNTER — Other Ambulatory Visit: Payer: Self-pay | Admitting: Family Medicine

## 2022-03-20 ENCOUNTER — Other Ambulatory Visit: Payer: Self-pay | Admitting: Registered Nurse

## 2022-03-20 DIAGNOSIS — G8929 Other chronic pain: Secondary | ICD-10-CM

## 2022-03-20 DIAGNOSIS — F411 Generalized anxiety disorder: Secondary | ICD-10-CM

## 2022-03-20 NOTE — Telephone Encounter (Signed)
Called patient to discuss if she has a new PCP or anything scheduled as of yet. Dr Birdie Riddle has refused to refill this at this time

## 2022-03-20 NOTE — Telephone Encounter (Signed)
These are sometime automated requests from the pharmacy when Rx is "due" someone can reach out to the patient and find out status of new PCP and inform the patient

## 2022-03-20 NOTE — Telephone Encounter (Signed)
Encourage patient to contact the pharmacy for refills or they can request refills through [facility]  (Please schedule appointment if patient has not been seen in over a year)    WHAT PHARMACY WOULD THEY LIKE THIS SENT TO: [id number] [place] [place] - [address]  MEDICATION NAME & DOSE: Clonzepam 0.5 mg and cyclobenzaprine 5 mg   NOTES/COMMENTS FROM PATIENT: Pt called to get a refill on her medications. Pt has a new pcp, NP appt is 03/29/22.      [place] please notify patient: It takes 48-72 hours to process rx refill requests Ask patient to call pharmacy to ensure rx is ready before heading there.

## 2022-03-20 NOTE — Telephone Encounter (Signed)
This was the patient you had declined last week, had attempted a call to ask about PCP status without return patient now reports appt 03/29/22 and asking for one more fill. Can you complete this request or does patient need to wait till she can see new PCP

## 2022-03-20 NOTE — Telephone Encounter (Signed)
Patient is requesting a refill of the following medications: Requested Prescriptions   Pending Prescriptions Disp Refills   amphetamine-dextroamphetamine (ADDERALL) 10 MG tablet 90 tablet 0    Sig: Take 1 tablet (10 mg total) by mouth 3 (three) times daily. NEED TO FIND NEW PROVIDER   cyclobenzaprine (FLEXERIL) 5 MG tablet 30 tablet 0    Sig: TAKE 1 TABLET BY MOUTH THREE TIMES A DAY AS NEEDED FOR MUSCLE SPASM.  Need to find new provider   clonazePAM (KLONOPIN) 0.5 MG tablet 60 tablet 0    Sig: Take 1 tablet (0.5 mg total) by mouth 3 (three) times daily as needed for anxiety.    Date of patient request: 03/20/22 Last office visit: 01/20/22 Date of last refill: 02/20/22 Last refill amount: 90, 30, 60

## 2022-03-21 ENCOUNTER — Other Ambulatory Visit: Payer: Self-pay | Admitting: Family Medicine

## 2022-03-21 DIAGNOSIS — G8929 Other chronic pain: Secondary | ICD-10-CM

## 2022-03-21 DIAGNOSIS — F411 Generalized anxiety disorder: Secondary | ICD-10-CM

## 2022-03-21 MED ORDER — CLONAZEPAM 0.5 MG PO TABS: 0.5000 mg | ORAL_TABLET | Freq: Three times a day (TID) | ORAL | 0 refills | Status: DC | PRN

## 2022-03-21 MED ORDER — CYCLOBENZAPRINE HCL 5 MG PO TABS: ORAL_TABLET | ORAL | 0 refills | Status: DC

## 2022-03-21 NOTE — Telephone Encounter (Signed)
I hate to ask you but could you refuse this it has been sent in pharmacy sent a duplicate

## 2022-03-21 NOTE — Telephone Encounter (Signed)
LM for the patient to inform her of the refills

## 2022-03-29 ENCOUNTER — Encounter: Payer: Federal, State, Local not specified - PPO | Admitting: Internal Medicine

## 2022-04-07 ENCOUNTER — Other Ambulatory Visit: Payer: Self-pay | Admitting: Family Medicine

## 2022-04-07 DIAGNOSIS — F411 Generalized anxiety disorder: Secondary | ICD-10-CM

## 2022-04-07 DIAGNOSIS — G8929 Other chronic pain: Secondary | ICD-10-CM

## 2022-04-07 NOTE — Telephone Encounter (Signed)
Can you refuse ? Pt has been informed numerous times she needs a new PCP and from our last conversation she is suppose to be seeing one

## 2022-04-09 ENCOUNTER — Other Ambulatory Visit: Payer: Self-pay | Admitting: Family Medicine

## 2022-04-10 ENCOUNTER — Other Ambulatory Visit: Payer: Self-pay | Admitting: Family Medicine

## 2022-04-10 DIAGNOSIS — G8929 Other chronic pain: Secondary | ICD-10-CM

## 2022-04-10 DIAGNOSIS — F411 Generalized anxiety disorder: Secondary | ICD-10-CM

## 2022-04-10 NOTE — Telephone Encounter (Signed)
Can you refuse these I have called and left her pharmacy a Vm stating we will not refill these medications

## 2022-04-10 NOTE — Telephone Encounter (Signed)
Pt no-showed for her transfer of care appt on 11/8 which is why her appt is now 11/29.  I denied her medication as she was told it would not be refilled.

## 2022-04-10 NOTE — Telephone Encounter (Signed)
I also refilled the Clonazepam and the Flexeril just 2 weeks ago.  No refills should be needed at this time

## 2022-04-19 ENCOUNTER — Encounter: Payer: Federal, State, Local not specified - PPO | Admitting: Internal Medicine

## 2022-04-20 ENCOUNTER — Other Ambulatory Visit: Payer: Self-pay

## 2022-04-20 ENCOUNTER — Other Ambulatory Visit: Payer: Self-pay | Admitting: Family Medicine

## 2022-04-20 DIAGNOSIS — F411 Generalized anxiety disorder: Secondary | ICD-10-CM

## 2022-04-20 DIAGNOSIS — G8929 Other chronic pain: Secondary | ICD-10-CM

## 2022-04-20 NOTE — Telephone Encounter (Signed)
Patient is requesting a refill of the following medications: Requested Prescriptions   Pending Prescriptions Disp Refills   amphetamine-dextroamphetamine (ADDERALL) 10 MG tablet 90 tablet 0    Sig: Take 1 tablet (10 mg total) by mouth 3 (three) times daily. NEED TO FIND NEW PROVIDER   cyclobenzaprine (FLEXERIL) 5 MG tablet 30 tablet 0    Sig: TAKE 1 TABLET BY MOUTH THREE TIMES A DAY AS NEEDED FOR MUSCLE SPASM.  Need to find new provider   clonazePAM (KLONOPIN) 0.5 MG tablet 60 tablet 0    Sig: Take 1 tablet (0.5 mg total) by mouth 3 (three) times daily as needed for anxiety.    Date of patient request: 04/20/22 Last office visit: 01/20/22 Date of last refill: 03/21/22 Last refill amount: 30, 60, 90  Patient has appt on Monday to transfer care and is asking for a short supply of each of these as she notes she keeps being denied for these rx and feels unwell without them  See other phone note for today  Please advise

## 2022-04-21 MED ORDER — AMPHETAMINE-DEXTROAMPHETAMINE 10 MG PO TABS: 10.0000 mg | ORAL_TABLET | Freq: Three times a day (TID) | ORAL | 0 refills | Status: DC

## 2022-04-21 MED ORDER — CYCLOBENZAPRINE HCL 5 MG PO TABS: ORAL_TABLET | ORAL | 0 refills | Status: DC

## 2022-04-21 MED ORDER — CLONAZEPAM 0.5 MG PO TABS: 0.5000 mg | ORAL_TABLET | Freq: Three times a day (TID) | ORAL | 0 refills | Status: DC | PRN

## 2022-04-21 NOTE — Progress Notes (Signed)
Pt missed her new pt appt w/ new provider in early November.  She will not get any additional prescriptions from this office so I strongly encourage her to keep her appt on 12/4

## 2022-04-21 NOTE — Progress Notes (Signed)
Patient informed and states no plans to miss appt Monday

## 2022-04-24 ENCOUNTER — Encounter: Payer: Self-pay | Admitting: Internal Medicine

## 2022-04-24 ENCOUNTER — Ambulatory Visit (INDEPENDENT_AMBULATORY_CARE_PROVIDER_SITE_OTHER): Payer: Federal, State, Local not specified - PPO | Admitting: Internal Medicine

## 2022-04-24 VITALS — BP 100/68 | HR 110 | Temp 98.0°F | Resp 14 | Ht 66.5 in | Wt 240.4 lb

## 2022-04-24 DIAGNOSIS — N200 Calculus of kidney: Secondary | ICD-10-CM

## 2022-04-24 DIAGNOSIS — Z1211 Encounter for screening for malignant neoplasm of colon: Secondary | ICD-10-CM

## 2022-04-24 DIAGNOSIS — F902 Attention-deficit hyperactivity disorder, combined type: Secondary | ICD-10-CM

## 2022-04-24 DIAGNOSIS — M797 Fibromyalgia: Secondary | ICD-10-CM

## 2022-04-24 DIAGNOSIS — E039 Hypothyroidism, unspecified: Secondary | ICD-10-CM

## 2022-04-24 DIAGNOSIS — Z79899 Other long term (current) drug therapy: Secondary | ICD-10-CM

## 2022-04-24 DIAGNOSIS — Z9049 Acquired absence of other specified parts of digestive tract: Secondary | ICD-10-CM | POA: Diagnosis not present

## 2022-04-24 DIAGNOSIS — N951 Menopausal and female climacteric states: Secondary | ICD-10-CM

## 2022-04-24 DIAGNOSIS — I10 Essential (primary) hypertension: Secondary | ICD-10-CM

## 2022-04-24 DIAGNOSIS — F4310 Post-traumatic stress disorder, unspecified: Secondary | ICD-10-CM

## 2022-04-24 DIAGNOSIS — F411 Generalized anxiety disorder: Secondary | ICD-10-CM

## 2022-04-24 MED ORDER — HYDROCHLOROTHIAZIDE 25 MG PO TABS: 25.0000 mg | ORAL_TABLET | Freq: Every day | ORAL | 3 refills | Status: DC

## 2022-04-24 MED ORDER — AMPHETAMINE-DEXTROAMPHETAMINE 10 MG PO TABS: 10.0000 mg | ORAL_TABLET | Freq: Three times a day (TID) | ORAL | 0 refills | Status: DC

## 2022-04-24 MED ORDER — VENLAFAXINE HCL ER 75 MG PO CP24: ORAL_CAPSULE | ORAL | 3 refills | Status: DC

## 2022-04-24 MED ORDER — CLONAZEPAM 0.5 MG PO TABS: 0.5000 mg | ORAL_TABLET | Freq: Three times a day (TID) | ORAL | 1 refills | Status: DC | PRN

## 2022-04-24 MED ORDER — ACETAZOLAMIDE 250 MG PO TABS: ORAL_TABLET | ORAL | 3 refills | Status: DC

## 2022-04-24 MED ORDER — LEVOTHYROXINE SODIUM 100 MCG PO TABS: 100.0000 ug | ORAL_TABLET | Freq: Every day | ORAL | 3 refills | Status: DC

## 2022-04-24 NOTE — Patient Instructions (Addendum)
It was a pleasure seeing you today!  Your health and satisfaction are my top priorities. If you believe your experience today was worthy of a 5-star rating, I'd be grateful for your feedback! Loralee Pacas, MD   CHECKOUT CHECKLIST  '[]'$    Schedule next appointment(s):    Please follow up in 2 to 6 weeks  Any requested lab visits should be scheduled as appointments too  If you are not doing well:  Return to the office sooner Please bring all your medicine bottles to each appointment If your condition begins to worsen or become severe:  go to the emergency room or even call 911  '[]'$    Sign release of information authorizations: Any records we need for your care and to be your medical home  REMINDERS:   '[]'$   (Optional):  Review your clinical notes on MyChart after they are completed.     Today's draft of the physician documented plan for today's visit: (final revisions will be visible on MyChart chart later) PTSD (post-traumatic stress disorder)  Colon cancer screening -     Cologuard  Bilateral kidney stones  History of cholecystectomy  ADHD (attention deficit hyperactivity disorder), combined type Assessment & Plan: Treatment delayed until adulthood due to raised in [place] household.   Orders: -     Amphetamine-Dextroamphetamine; Take 1 tablet (10 mg total) by mouth 3 (three) times daily. NEED TO FIND NEW PROVIDER  Dispense: 90 tablet; Refill: 0  Generalized anxiety disorder Assessment & Plan: Referred to behavioral health to replace Declined to have psychiatry at this time   Orders: -     Ambulatory referral to Psychology -     clonazePAM; Take 1 tablet (0.5 mg total) by mouth 3 (three) times daily as needed for anxiety.  Dispense: 270 tablet; Refill: 1  Generalized anxiety disorder Assessment & Plan: Referred to behavioral health to replace Declined to have psychiatry at this time   Orders: -     Ambulatory referral to Psychology -     clonazePAM;  Take 1 tablet (0.5 mg total) by mouth 3 (three) times daily as needed for anxiety.  Dispense: 270 tablet; Refill: 1  Perimenopausal -     Ambulatory referral to Psychology -     Venlafaxine HCl ER; TAKE 1 CAPSULE BY MOUTH EVERY DAY WITH BREAKFAST  Dispense: 90 capsule; Refill: 3  Fibromyalgia muscle pain -     Ambulatory referral to Psychology -     Venlafaxine HCl ER; TAKE 1 CAPSULE BY MOUTH EVERY DAY WITH BREAKFAST  Dispense: 90 capsule; Refill: 3  Primary hypertension -     acetaZOLAMIDE; TAKE 1 TABLET BY MOUTH DAILY. PLEASE FIND NEW PROVIDER  Dispense: 90 tablet; Refill: 3 -     hydroCHLOROthiazide; Take 1 tablet (25 mg total) by mouth daily.  Dispense: 90 tablet; Refill: 3  Acquired hypothyroidism -     Levothyroxine Sodium; Take 1 tablet (100 mcg total) by mouth daily before breakfast.  Dispense: 90 tablet; Refill: 3 -     Amphetamine-Dextroamphetamine; Take 1 tablet (10 mg total) by mouth 3 (three) times daily. NEED TO FIND NEW PROVIDER  Dispense: 90 tablet; Refill: 0 -     Amphetamine-Dextroamphetamine; Take 1 tablet (10 mg total) by mouth 3 (three) times daily. NEED TO FIND NEW PROVIDER  Dispense: 90 tablet; Refill: 0  High risk medication use -     Drug Screen, 5 Panel, Ur    QUESTIONS & CONCERNS: CLINICAL: please contact us via phone (  [phone] OR MyChart messaging  LAB & IMAGING:   We will call you if the results are significantly abnormal or you don't use MyChart.  Most normal results will be posted to MyChart immediately and have a clinical review message by Dr. Randol Kern posted within 2-3 business days.   If you have not heard from [place] regarding the results in 2 weeks OR if you need priority reporting, please contact this office. MYCHART:  The fastest way to get your results and easiest way to stay in touch with [place] is by activating your My Chart account. Instructions are located on the last page of this paperwork.  BILLING: xray and lab orders are billed from separate  companies and questions./concerns should be directed to the [facility].  For visit charges please discuss with our administrative services COMPLAINTS:  please let Dr. Randol Kern know or see the [facility], by asking at the front desk: we want you to be satisfied with every experience and we would be grateful for the opportunity to address any problems
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
## 2022-04-24 NOTE — Assessment & Plan Note (Signed)
Referred to behavioral health to replace Declined to have psychiatry at this time

## 2022-04-24 NOTE — Assessment & Plan Note (Signed)
Treatment delayed until adulthood due to raised in [place] household.

## 2022-04-28 LAB — DRUG SCREEN, 5 PANEL, UR
Amphetamines, Urine: POSITIVE — AB
Cannabinoid Quant, Ur: NEGATIVE ng/mL
Cocaine (Metab.): NEGATIVE ng/mL
OPIATE QUANTITATIVE URINE: NEGATIVE ng/mL
PCP Quant, Ur: NEGATIVE ng/mL

## 2022-05-01 ENCOUNTER — Telehealth: Payer: Self-pay | Admitting: Internal Medicine

## 2022-05-01 NOTE — Telephone Encounter (Signed)
  LAST APPOINTMENT DATE:  04/24/22  NEXT APPOINTMENT DATE: 05/31/22  MEDICATION: clonazePAM (KLONOPIN) 0.5 MG tablet   Is the patient out of medication? Yes  PHARMACY: [id number] [place],  - [id number]: [phone] Fax: [phone]  Patient states pharmacy informed her that they did not receive the medication refill on 12/04. States she tried checking in again on 12/10, but they stated they still hadn't received the refill request.

## 2022-05-08 ENCOUNTER — Other Ambulatory Visit: Payer: Self-pay | Admitting: Family Medicine

## 2022-05-08 DIAGNOSIS — G8929 Other chronic pain: Secondary | ICD-10-CM

## 2022-05-09 NOTE — Telephone Encounter (Signed)
Rx sent on 04/24/22.

## 2022-05-25 ENCOUNTER — Telehealth: Payer: Self-pay | Admitting: Family Medicine

## 2022-05-25 DIAGNOSIS — G8929 Other chronic pain: Secondary | ICD-10-CM

## 2022-05-25 NOTE — Telephone Encounter (Signed)
LAST APPOINTMENT DATE:  04/24/22  NEXT APPOINTMENT DATE: 05/31/22  MEDICATION: cyclobenzaprine (FLEXERIL) 5 MG tablet   Is the patient out of medication? Yes  PHARMACY:  [id number] [place], [place] - [id number]: [phone] Fax: [phone]

## 2022-05-30 ENCOUNTER — Other Ambulatory Visit: Payer: Self-pay

## 2022-05-30 ENCOUNTER — Other Ambulatory Visit: Payer: Self-pay | Admitting: Family Medicine

## 2022-05-30 ENCOUNTER — Telehealth: Payer: Self-pay | Admitting: Internal Medicine

## 2022-05-30 ENCOUNTER — Other Ambulatory Visit: Payer: Self-pay | Admitting: Internal Medicine

## 2022-05-30 DIAGNOSIS — G8929 Other chronic pain: Secondary | ICD-10-CM

## 2022-05-30 MED ORDER — CLONAZEPAM 0.5 MG PO TABS: 0.5000 mg | ORAL_TABLET | Freq: Three times a day (TID) | ORAL | 5 refills | Status: DC | PRN

## 2022-05-30 MED ORDER — CYCLOBENZAPRINE HCL 5 MG PO TABS: ORAL_TABLET | ORAL | 2 refills | Status: DC

## 2022-05-30 NOTE — Telephone Encounter (Signed)
Also informed patient of Dr.Morrison's advice to try to slowly taper off of the clonazepam and to follow-up with  Psych if unable to. Patient was upset and stated that she was offended by his advice.

## 2022-05-30 NOTE — Telephone Encounter (Signed)
[facility] and was informed that they did not have the clonazepam prescription for the patient on their end, although there is a a receipt confirmation for 12/4 in the patient's chart. Afterwards, I spoke with the patient and her husband today to inform them of this information. Dr.Morrison sent in both prescriptions (sent in clonazepam again and cyclobenzaprine) and I spoke with pharmacy to confirm receipt of both prescriptions. Was informed that it is too soon for her to have the clonazepam filled.

## 2022-05-30 NOTE — Telephone Encounter (Signed)
Need to add RX  clonazePAM (KLONOPIN) 0.5 MG tablet    cyclobenzaprine (FLEXERIL) 5 MG tablet   Please send ASAP!!

## 2022-05-30 NOTE — Addendum Note (Signed)
Addended by: Loralee Pacas on: 05/30/2022 01:23 PM   Modules accepted: Orders

## 2022-05-30 NOTE — Telephone Encounter (Signed)
Need to add RX  clonazePAM (KLONOPIN) 0.5 MG tablet   cyclobenzaprine (FLEXERIL) 5 MG tablet   Please send ASAP!!

## 2022-05-31 ENCOUNTER — Ambulatory Visit: Payer: Federal, State, Local not specified - PPO | Admitting: Internal Medicine

## 2022-06-07 NOTE — Telephone Encounter (Signed)
This has already been taken care of

## 2022-06-29 ENCOUNTER — Telehealth: Payer: Self-pay | Admitting: Internal Medicine

## 2022-06-29 NOTE — Telephone Encounter (Signed)
Pt states her insurance company needs a call back to get the form and fax number for a prior authorization. Phone [phone]. Fax [phone]. Date of pick up - 06/23/22. Please put that on form.

## 2022-07-02 ENCOUNTER — Other Ambulatory Visit: Payer: Self-pay | Admitting: Internal Medicine

## 2022-07-02 DIAGNOSIS — G8929 Other chronic pain: Secondary | ICD-10-CM

## 2022-07-04 ENCOUNTER — Other Ambulatory Visit: Payer: Self-pay | Admitting: Internal Medicine

## 2022-07-04 ENCOUNTER — Other Ambulatory Visit: Payer: Self-pay

## 2022-07-04 DIAGNOSIS — E039 Hypothyroidism, unspecified: Secondary | ICD-10-CM

## 2022-07-04 DIAGNOSIS — F902 Attention-deficit hyperactivity disorder, combined type: Secondary | ICD-10-CM

## 2022-07-04 MED ORDER — AMPHETAMINE-DEXTROAMPHETAMINE 10 MG PO TABS: 10.0000 mg | ORAL_TABLET | Freq: Three times a day (TID) | ORAL | 0 refills | Status: DC

## 2022-07-04 NOTE — Telephone Encounter (Signed)
Was able to find paperwork and submitted PA that was approved immediately.

## 2022-07-04 NOTE — Telephone Encounter (Signed)
Left a vm for patient to call the office back to let me know which medication a PA is needed for (I called her insurance company to complete it on the phone but wasn't able to due to this) . I don't have any PA paperwork for her nor is anything in Covermymeds.

## 2022-07-04 NOTE — Progress Notes (Signed)
Pharmacy wasn't able to fill prior written scripts for methylphenidate due to wrong diagnosis of hypothyroidism, so I reordered them with the corrected diagnosis.

## 2022-07-04 NOTE — Telephone Encounter (Signed)
Left another vm notifying patient of the PA approval.

## 2022-08-09 ENCOUNTER — Other Ambulatory Visit: Payer: Self-pay | Admitting: Internal Medicine

## 2022-08-09 DIAGNOSIS — G8929 Other chronic pain: Secondary | ICD-10-CM

## 2022-09-09 ENCOUNTER — Other Ambulatory Visit: Payer: Self-pay | Admitting: Internal Medicine

## 2022-09-09 DIAGNOSIS — F902 Attention-deficit hyperactivity disorder, combined type: Secondary | ICD-10-CM

## 2022-09-11 NOTE — Telephone Encounter (Signed)
Last refill: 07/04/22 #90, 0 Last OV: 04/24/22 dx. PTSD, ADHD

## 2022-09-12 ENCOUNTER — Other Ambulatory Visit: Payer: Self-pay | Admitting: Internal Medicine

## 2022-09-12 DIAGNOSIS — F902 Attention-deficit hyperactivity disorder, combined type: Secondary | ICD-10-CM

## 2022-09-12 NOTE — Telephone Encounter (Signed)
Last refill: 07/04/22 #90, 0 Last OV: 04/24/22 dx. PTSD

## 2022-09-13 ENCOUNTER — Ambulatory Visit: Payer: Federal, State, Local not specified - PPO | Admitting: Internal Medicine

## 2022-09-13 ENCOUNTER — Encounter: Payer: Self-pay | Admitting: Internal Medicine

## 2022-09-13 VITALS — BP 139/90 | HR 99 | Temp 98.1°F | Ht 66.5 in | Wt 243.4 lb

## 2022-09-13 DIAGNOSIS — Z79899 Other long term (current) drug therapy: Secondary | ICD-10-CM | POA: Diagnosis not present

## 2022-09-13 DIAGNOSIS — M5127 Other intervertebral disc displacement, lumbosacral region: Secondary | ICD-10-CM

## 2022-09-13 DIAGNOSIS — F902 Attention-deficit hyperactivity disorder, combined type: Secondary | ICD-10-CM

## 2022-09-13 DIAGNOSIS — F411 Generalized anxiety disorder: Secondary | ICD-10-CM

## 2022-09-13 DIAGNOSIS — M545 Low back pain, unspecified: Secondary | ICD-10-CM

## 2022-09-13 MED ORDER — AMPHETAMINE-DEXTROAMPHETAMINE 10 MG PO TABS
10.0000 mg | ORAL_TABLET | Freq: Three times a day (TID) | ORAL | 0 refills | Status: DC
Start: 2022-11-12 — End: 2022-12-13

## 2022-09-13 MED ORDER — AMPHETAMINE-DEXTROAMPHETAMINE 10 MG PO TABS
10.0000 mg | ORAL_TABLET | Freq: Three times a day (TID) | ORAL | 0 refills | Status: DC
Start: 2022-10-13 — End: 2022-12-13

## 2022-09-13 MED ORDER — AMPHETAMINE-DEXTROAMPHETAMINE 10 MG PO TABS: 10.0000 mg | ORAL_TABLET | Freq: Three times a day (TID) | ORAL | 0 refills | Status: DC
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
## 2022-09-13 NOTE — Assessment & Plan Note (Addendum)
She reports excellent adherence to her controlled substance medication regimen and compliance with expectations. No evidence of misuse or diversion identified at this visit and no suspicion has been raised for such. Refreshed contract Urine Drug Screening Data:    Component Value Date/Time   COCAINSCRNUR Negative 04/24/2022 1545     Adherence:  She has been briefed on our diversion prevention protocol, which is integral to our medication management strategy. She understands the importance of bringing her medications in their original containers for verification, the option of submitting time-stamped medication photos via MyChart, and the necessity of routine urine drug screenings. She agrees to these terms, which are in place to ensure the safe and effective use of her medically necessary controlled substance prescriptions, and to fulfill our regulatory obligations. Cassidy Hopkins  history of adherence to our prescribing agreement has been exemplary, with no indications of misuse or diversion. We will continue to monitor and support her treatment plan, ensuring compliance with safety protocols and regulatory requirements.

## 2022-09-13 NOTE — Assessment & Plan Note (Signed)
Encouraged patient to taper clonazepam as tolerated Encouraged patient to continue with Effexor

## 2022-09-13 NOTE — Patient Instructions (Addendum)
It was a pleasure seeing you today!  Your health and satisfaction are my top priorities. If you believe your experience today was worthy of a 5-star rating, I'd be grateful for your feedback! Cassidy Olszewski, MD   Next Steps: Schedule Follow-Up:  If any of your medical issues become urgent or worsen, please don't hesitate to reach out or seek emergency room care. In the meantime, I strongly encourage scheduling routine follow up appointments prior to leaving or calling [phone] if you don't have one scheduled yet.  We recommend your next follow-up appointment no later than Return for chronic disease monitoring and management .  Please return sooner if you are not doing well.  Preventive Care:  Don't forget to schedule your annual preventive care visit!  This important checkup is typically covered by insurance and helps identify potential health issues early.  Typically its 100% insurance covered with no co-pay and helps to get surveillance labwork paid for.  Lab & X-ray Appointments:  Scheduled any incomplete lab tests today or call us to schedule.  XRays can be done without an appointment at [facility] at [facility] ([address], [address]), M-F 8:30am-noon or 1pm-5pm.  Just tell them you're there for X-rays ordered by Dr. Jon Billings.  We'll receive the results and contact you by phone or MyChart to discuss next steps.  Medical Information Release:  If you have any relevant medical information we don't have, please sign a release form so we can obtain it for your records.  Bring to Your Next Appointment: Medications: Please bring all your medication bottles to your next appointment to ensure we have an accurate record of your prescriptions. Health Diaries: If you're monitoring any health conditions at home, keeping a diary of your readings can be very helpful for discussions at your next appointment.  Please Review your early draft clinical notes below and the final encounter summary  tomorrow on MyChart after its been completed.   High risk medication use Assessment & Plan: Interim history:   She reports excellent adherence to her controlled substance medication regimen and compliance with expectations. No evidence of misuse or diversion identified at this visit and no suspicion has been raised for such.  Indication for controlled substance: Attention Deficit Hyperactivity Disorder (ADHD) and generalized anxiety disorder PTSD  Medication and dose: see associated problem and med list  Controlled substance contract signed (Y/N): Yes, scanned  PDMP last reviewed (include red flags): 09/13/22 see below Urine Drug Screening Data:    Component Value Date/Time   COCAINSCRNUR Negative 04/24/2022 1545     Adherence:  Cassidy Hopkins has been briefed on our diversion prevention protocol, which is integral to our medication management strategy. She understands the importance of bringing her medications in their original containers for verification, the option of submitting time-stamped medication photos via MyChart, and the necessity of routine urine drug screenings. She agrees to these terms, which are in place to ensure the safe and effective use of her medically necessary controlled substance prescriptions, and to fulfill our regulatory obligations. Cassidy Hopkins  history of adherence to our prescribing agreement has been exemplary, with no indications of misuse or diversion. We will continue to monitor and support her treatment plan, ensuring compliance with safety protocols and regulatory requirements.    Generalized anxiety disorder Assessment & Plan: Encouraged patient to taper clonazepam as tolerated Encouraged patient to continue with Effexor    ADHD (attention deficit hyperactivity disorder), combined type -     Amphetamine-Dextroamphetamine; Take 1 tablet (  10 mg total) by mouth with breakfast, with lunch, and with evening meal. Take three tablets daily by mouth  Dispense: 90  tablet; Refill: 0 -     Amphetamine-Dextroamphetamine; Take 1 tablet (10 mg total) by mouth in the morning, at noon, and at bedtime. Take three tablets daily by mouth  Dispense: 90 tablet; Refill: 0 -     Amphetamine-Dextroamphetamine; Take 1 tablet (10 mg total) by mouth in the morning, at noon, and at bedtime. Take three tablets daily by mouth  Dispense: 90 tablet; Refill: 0  Back pain at L4-L5 level -     DG Lumb Spine Flex&Ext Only; Future  Herniation of intervertebral disc between L5 and S1     Getting Answers and Following Up: Simple Questions & Concerns: For quick questions or basic follow-up after your visit, reach [place] at [phone] or MyChart messaging. Complex Concerns: If your concern is more complex, scheduling an appointment might be best. Discuss this with the staff to find the most suitable option. Lab & Imaging Results: We'll contact you directly if results are abnormal or you don't use MyChart. Most normal results will be on MyChart within 2-3 business days, with a review message from Dr. Jon Billings. Haven't heard back in 2 weeks? Need results sooner? Contact us at [phone]. Referrals: Our referral coordinator will manage specialist referrals. The specialist's office should contact you within 2 weeks to schedule an appointment. Call us if you haven't heard from them after 2 weeks.  Staying Connected:  MyChart: Activate your MyChart for the fastest way to access results and message [place]. See the last page of this paperwork for instructions.  Billing: X-ray & Lab Orders: These are billed by separate companies. Contact the invoicing company directly for questions or concerns. Visit Charges: Discuss any billing inquiries with our administrative services team.  Feedback & Satisfaction: Share Your Experience: We strive for your satisfaction! If you have any complaints, please let Dr. Jon Billings know directly or contact our Practice Administrators, Edwena Felty or [employer], by asking at the front desk.  Scheduling Tips: Shorter Wait Times: 8 am and 1 pm appointments often have the quickest wait times. Longer Appointments: If you need more time during your visit, talk to the front desk. Due to insurance regulations, multiple back-to-back appointments might be necessary.
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
## 2022-09-17 ENCOUNTER — Other Ambulatory Visit: Payer: Self-pay | Admitting: Internal Medicine

## 2022-09-17 DIAGNOSIS — G8929 Other chronic pain: Secondary | ICD-10-CM

## 2022-10-11 ENCOUNTER — Telehealth: Payer: Self-pay | Admitting: Internal Medicine

## 2022-11-03 ENCOUNTER — Other Ambulatory Visit: Payer: Self-pay | Admitting: Internal Medicine

## 2022-11-03 DIAGNOSIS — G8929 Other chronic pain: Secondary | ICD-10-CM

## 2022-11-06 ENCOUNTER — Telehealth: Payer: Self-pay | Admitting: Internal Medicine

## 2022-11-06 NOTE — Telephone Encounter (Signed)
Patient states she going [place] of [place] tomorrow night 11/07/22 and will run out of  amphetamine-dextroamphetamine (ADDERALL) 10 MG tablet Before returning 11/20/22  Requests to be called to be advised as to what she should do (get new RX?)

## 2022-11-07 NOTE — Telephone Encounter (Signed)
Is this okay to refill? 

## 2022-11-07 NOTE — Telephone Encounter (Signed)
I called pts pharmacy, Pharmacist will fill early.  Returned pts call and pt gave a verbalized understanding.

## 2022-11-20 ENCOUNTER — Other Ambulatory Visit: Payer: Self-pay | Admitting: Internal Medicine

## 2022-11-20 DIAGNOSIS — F411 Generalized anxiety disorder: Secondary | ICD-10-CM

## 2022-12-11 ENCOUNTER — Other Ambulatory Visit: Payer: Self-pay | Admitting: Internal Medicine

## 2022-12-11 DIAGNOSIS — F902 Attention-deficit hyperactivity disorder, combined type: Secondary | ICD-10-CM

## 2022-12-13 ENCOUNTER — Encounter: Payer: Self-pay | Admitting: Internal Medicine

## 2022-12-13 ENCOUNTER — Ambulatory Visit: Payer: Federal, State, Local not specified - PPO | Admitting: Internal Medicine

## 2022-12-13 VITALS — BP 121/89 | HR 101 | Temp 97.6°F | Ht 66.5 in | Wt 246.2 lb

## 2022-12-13 DIAGNOSIS — G932 Benign intracranial hypertension: Secondary | ICD-10-CM

## 2022-12-13 DIAGNOSIS — E669 Obesity, unspecified: Secondary | ICD-10-CM | POA: Diagnosis not present

## 2022-12-13 DIAGNOSIS — F411 Generalized anxiety disorder: Secondary | ICD-10-CM

## 2022-12-13 DIAGNOSIS — F902 Attention-deficit hyperactivity disorder, combined type: Secondary | ICD-10-CM

## 2022-12-13 DIAGNOSIS — F4310 Post-traumatic stress disorder, unspecified: Secondary | ICD-10-CM | POA: Diagnosis not present

## 2022-12-13 DIAGNOSIS — Z9049 Acquired absence of other specified parts of digestive tract: Secondary | ICD-10-CM

## 2022-12-13 DIAGNOSIS — M5127 Other intervertebral disc displacement, lumbosacral region: Secondary | ICD-10-CM

## 2022-12-13 DIAGNOSIS — E039 Hypothyroidism, unspecified: Secondary | ICD-10-CM

## 2022-12-13 DIAGNOSIS — F5104 Psychophysiologic insomnia: Secondary | ICD-10-CM | POA: Diagnosis not present

## 2022-12-13 DIAGNOSIS — Z79899 Other long term (current) drug therapy: Secondary | ICD-10-CM

## 2022-12-13 DIAGNOSIS — F908 Attention-deficit hyperactivity disorder, other type: Secondary | ICD-10-CM

## 2022-12-13 DIAGNOSIS — E038 Other specified hypothyroidism: Secondary | ICD-10-CM

## 2022-12-13 LAB — LIPID PANEL
Cholesterol: 247 mg/dL — ABNORMAL HIGH (ref 0–200)
HDL: 64.1 mg/dL (ref >39.00)
LDL Cholesterol: 156 mg/dL — ABNORMAL HIGH (ref 0–99)
NonHDL: 182.63
Total CHOL/HDL Ratio: 4
Triglycerides: 134 mg/dL (ref 0.0–149.0)
VLDL: 26.8 mg/dL (ref 0.0–40.0)

## 2022-12-13 LAB — COMPREHENSIVE METABOLIC PANEL
ALT: 15 U/L (ref 0–35)
AST: 18 U/L (ref 0–37)
Albumin: 4.1 g/dL (ref 3.5–5.2)
Alkaline Phosphatase: 105 U/L (ref 39–117)
BUN: 14 mg/dL (ref 6–23)
CO2: 27 mEq/L (ref 19–32)
Calcium: 9.5 mg/dL (ref 8.4–10.5)
Chloride: 100 mEq/L (ref 96–112)
Creatinine, Ser: 0.78 mg/dL (ref 0.40–1.20)
GFR: 89.28 mL/min (ref >60.00)
Glucose, Bld: 109 mg/dL — ABNORMAL HIGH (ref 70–99)
Potassium: 3.5 mEq/L (ref 3.5–5.1)
Sodium: 136 mEq/L (ref 135–145)
Total Bilirubin: 0.4 mg/dL (ref 0.2–1.2)
Total Protein: 7.6 g/dL (ref 6.0–8.3)

## 2022-12-13 LAB — CBC WITH DIFFERENTIAL/PLATELET
Basophils Absolute: 0.1 10*3/uL (ref 0.0–0.1)
Basophils Relative: 1 % (ref 0.0–3.0)
Eosinophils Absolute: 0.4 10*3/uL (ref 0.0–0.7)
Eosinophils Relative: 5.2 % — ABNORMAL HIGH (ref 0.0–5.0)
HCT: 47.2 % — ABNORMAL HIGH (ref 36.0–46.0)
Hemoglobin: 15.7 g/dL — ABNORMAL HIGH (ref 12.0–15.0)
Lymphocytes Relative: 29.6 % (ref 12.0–46.0)
Lymphs Abs: 2.2 10*3/uL (ref 0.7–4.0)
MCHC: 33.2 g/dL (ref 30.0–36.0)
MCV: 91 fl (ref 78.0–100.0)
Monocytes Absolute: 0.4 10*3/uL (ref 0.1–1.0)
Monocytes Relative: 5.1 % (ref 3.0–12.0)
Neutro Abs: 4.4 10*3/uL (ref 1.4–7.7)
Neutrophils Relative %: 59.1 % (ref 43.0–77.0)
Platelets: 415 10*3/uL — ABNORMAL HIGH (ref 150.0–400.0)
RBC: 5.18 Mil/uL — ABNORMAL HIGH (ref 3.87–5.11)
RDW: 13 % (ref 11.5–15.5)
WBC: 7.4 10*3/uL (ref 4.0–10.5)

## 2022-12-13 LAB — T4, FREE: Free T4: 0.81 ng/dL (ref 0.60–1.60)

## 2022-12-13 LAB — TSH: TSH: 5.33 u[IU]/mL (ref 0.35–5.50)

## 2022-12-13 MED ORDER — PRAZOSIN HCL 2 MG PO CAPS
2.0000 mg | ORAL_CAPSULE | Freq: Every day | ORAL | 2 refills | Status: DC
Start: 2022-12-13 — End: 2023-02-12

## 2022-12-13 MED ORDER — ZOLPIDEM TARTRATE 5 MG PO TABS
5.0000 mg | ORAL_TABLET | Freq: Every evening | ORAL | 0 refills | Status: DC | PRN
Start: 2022-12-13 — End: 2023-03-15

## 2022-12-13 MED ORDER — AMPHETAMINE-DEXTROAMPHETAMINE 10 MG PO TABS
10.0000 mg | ORAL_TABLET | Freq: Three times a day (TID) | ORAL | 0 refills | Status: DC
Start: 2022-12-13 — End: 2023-03-15

## 2022-12-13 MED ORDER — AMPHETAMINE-DEXTROAMPHETAMINE 10 MG PO TABS
10.0000 mg | ORAL_TABLET | Freq: Three times a day (TID) | ORAL | 0 refills | Status: DC
Start: 2023-01-12 — End: 2023-03-15

## 2022-12-13 MED ORDER — DOXEPIN HCL 10 MG PO CAPS
10.0000 mg | ORAL_CAPSULE | Freq: Every day | ORAL | 2 refills | Status: DC
Start: 2022-12-13 — End: 2023-01-04

## 2022-12-13 MED ORDER — AMPHETAMINE-DEXTROAMPHETAMINE 10 MG PO TABS
10.0000 mg | ORAL_TABLET | Freq: Three times a day (TID) | ORAL | 0 refills | Status: DC
Start: 2023-02-11 — End: 2023-03-15
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
## 2022-12-13 NOTE — Assessment & Plan Note (Addendum)
Clinically euthyroid, continue with thyroid hormone replacement. Recheck labs today and adjust medication if needed.  Hypothyroidism: They are on Synthroid, and the last thyroid function test was in October 2023. We will order thyroid function tests to assess the current thyroid status.Stable. We need records of prior workup we have just continued medication(s) and need more information.

## 2022-12-13 NOTE — Assessment & Plan Note (Signed)
Spoke on importance of Eye Movement Desensitization and Reprocessing (EMDR) treatment(s) and limits/weaknesses/risks of Klonopin for chronic treatment(s), she will continue tapering efforts. Gave info on [employer] [facility] (EMDR) providers.

## 2022-12-13 NOTE — Assessment & Plan Note (Addendum)
Anxiety and PTSD: They expressed concerns about the use of Clonazepam due to potential risks, including dementia and increased anxiety over time. We discussed the mechanism of action and the risks/benefits of Clonazepam. They have abruptly stopped Clonazepam and are experiencing withdrawal symptoms, necessitating a discussion on the need for a slow taper. They have significant PTSD related to past trauma, and we discussed the benefits of EMDR therapy for PTSD. We will gradually taper off Clonazepam, initiate EMDR therapy for PTSD, and provide short-term sleep aids including Doxepin, Hydroxyzine, and Prazosin as needed.Encouraged patient to to complete follow up imaging as planned

## 2022-12-13 NOTE — Patient Instructions (Addendum)
It was a pleasure seeing you today! Your health and satisfaction are our top priorities.  Cassidy Hew, MD  Your Providers PCP: Lula Olszewski, MD,  [phone]) Referring Provider: Lula Olszewski, MD,  [phone]) Care Team Provider: Bjorn Pippin, MD,  [phone])  VISIT SUMMARY:  During your recent visit, we discussed your ongoing anxiety, PTSD, ADHD, and hypothyroidism. You expressed concerns about the use of clonazepam due to potential side effects, and we discussed alternative therapies and medications. We also discussed your satisfaction with your current ADHD medication, Adderall, and the need for further testing to assess your thyroid function.  YOUR PLAN:  -ANXIETY AND PTSD: We discussed your concerns about clonazepam and decided to gradually taper off this medication. We also discussed the benefits of Eye Movement Desensitization and Reprocessing (EMDR) therapy for your PTSD. This is a type of therapy that helps reduce distress associated with traumatic memories. We will also provide short-term sleep aids as needed to help manage your insomnia.  -HYPOTHYROIDISM: Your hypothyroidism is currently managed with Synthroid, a medication that replaces or provides more thyroid hormone. However, we will order further tests to assess your current thyroid function and ensure the medication is working effectively.  -ADHD: You are currently taking Adderall three times a day for your ADHD and are satisfied with this regimen. We will continue this medication at the current dose. ADHD, or Attention Deficit Hyperactivity Disorder, is a condition that affects your ability to focus and control impulsive behaviors.  INSTRUCTIONS:  We will order a complete blood count to check for anemia and a lipid panel if you are fasting. These tests will help [place] understand your overall health. We have scheduled a follow-up appointment in 90 days. Please continue to manage your anxiety with exercise and  other non-pharmacological interventions as discussed.   NEXT STEPS: [x]  Early Intervention: Schedule sooner appointment, call our on-call services, or go to emergency room if there is any significant Increase in pain or discomfort New or worsening symptoms Sudden or severe changes in your health [x]  Flexible Follow-Up: We recommend a 90 days follow up for optimal routine care. This allows for progress monitoring and treatment adjustments. However, if you are doing poorly we recommend you return sooner. [x]  Preventive Care: Schedule your annual preventive care visit! It's typically covered by insurance and helps identify potential health issues early. [x]  Lab & X-ray Appointments: Incomplete tests scheduled today, or call to schedule. X-rays: [facility] (M-F, 8:30am-noon or 1pm-5pm). [x]  Medical Information Release: Sign a release form at front desk to obtain relevant medical information we don't have.  MAKING THE MOST OF OUR FOCUSED 20 MINUTE APPOINTMENTS: [x]   Clearly state your top concerns at the beginning of the visit to focus our discussion [x]   If you anticipate you will need more time, please inform the front desk during scheduling - we can book multiple appointments in the same week. [x]   If you have transportation problems- use our convenient video appointments or ask about transportation support. [x]   We can get down to business faster if you use MyChart to update information before the visit and submit non-urgent questions before your visit. Thank you for taking the time to provide details through MyChart.  Let our nurse know and she can import this information into your encounter documents.  Arrival and Wait Times: [x]   Arriving on time ensures that everyone receives prompt attention. [x]   Early morning (8a) and afternoon (1p) appointments tend to have shortest wait times. [x]   Unfortunately, we cannot delay appointments for late arrivals or hold slots during phone  calls.  Getting Answers and Following Up [x]   Simple Questions & Concerns: For quick questions or basic follow-up after your visit, reach [place] at [phone] or MyChart messaging. [x]   Complex Concerns: If your concern is more complex, scheduling an appointment might be best. Discuss this with the staff to find the most suitable option. [x]   Lab & Imaging Results: We'll contact you directly if results are abnormal or you don't use MyChart. Most normal results will be on MyChart within 2-3 business days, with a review message from Dr. Jon Billings. Haven't heard back in 2 weeks? Need results sooner? Contact us at [phone]. [x]   Referrals: Our referral coordinator will manage specialist referrals. The specialist's office should contact you within 2 weeks to schedule an appointment. Call us if you haven't heard from them after 2 weeks.  Staying Connected [x]   MyChart: Activate your MyChart for the fastest way to access results and message [place]. See the last page of this paperwork for instructions on how to activate.  Bring to Your Next Appointment [x]   Medications: Please bring all your medication bottles to your next appointment to ensure we have an accurate record of your prescriptions. [x]   Health Diaries: If you're monitoring any health conditions at home, keeping a diary of your readings can be very helpful for discussions at your next appointment.  Billing [x]   X-ray & Lab Orders: These are billed by separate companies. Contact the invoicing company directly for questions or concerns. [x]   Visit Charges: Discuss any billing inquiries with our administrative services team.  Your Satisfaction Matters [x]   Share Your Experience: We strive for your satisfaction! If you have any complaints, or preferably compliments, please let Dr. Jon Billings know directly or contact our Practice Administrators, Edwena Felty or [employer], by asking at the front desk.   Reviewing Your Records [x]   Review  this early draft of your clinical encounter notes below and the final encounter summary tomorrow on MyChart after its been completed.  All orders placed so far are visible here: Generalized anxiety disorder -     Lipid panel -     TSH -     Comprehensive metabolic panel -     CBC with Differential/Platelet -     T4, free  Herniation of intervertebral disc between L5 and S1 Assessment & Plan: Anxiety and PTSD: They expressed concerns about the use of Clonazepam due to potential risks, including dementia and increased anxiety over time. We discussed the mechanism of action and the risks/benefits of Clonazepam. They have abruptly stopped Clonazepam and are experiencing withdrawal symptoms, necessitating a discussion on the need for a slow taper. They have significant PTSD related to past trauma, and we discussed the benefits of EMDR therapy for PTSD. We will gradually taper off Clonazepam, initiate EMDR therapy for PTSD, and provide short-term sleep aids including Doxepin, Hydroxyzine, and Prazosin as needed.Encouraged patient to to complete follow up imaging as planned   Psychophysiological insomnia -     Zolpidem Tartrate; Take 1 tablet (5 mg total) by mouth at bedtime as needed for sleep.  Dispense: 15 tablet; Refill: 0 -     Prazosin HCl; Take 1 capsule (2 mg total) by mouth at bedtime.  Dispense: 30 capsule; Refill: 2 -     Doxepin HCl; Take 1 capsule (10 mg total) by mouth at bedtime.  Dispense: 30 capsule; Refill: 2  PTSD (post-traumatic stress disorder)  Attention deficit hyperactivity disorder (ADHD), other type -     Amphetamine-Dextroamphetamine; Take 1 tablet (10 mg total) by mouth in the morning, at noon, and at bedtime.  Dispense: 90 tablet; Refill: 0 -     Amphetamine-Dextroamphetamine; Take 1 tablet (10 mg total) by mouth in the morning, at noon, and at bedtime.  Dispense: 90 tablet; Refill: 0 -     Amphetamine-Dextroamphetamine; Take 1 tablet (10 mg total) by mouth in the  morning, at noon, and at bedtime.  Dispense: 90 tablet; Refill: 0  Other specified hypothyroidism Assessment & Plan: Clinically euthyroid, continue with thyroid hormone replacement. Recheck labs today and adjust medication if needed.  Hypothyroidism: They are on Synthroid, and the last thyroid function test was in October 2023. We will order thyroid function tests to assess the current thyroid status.Stable. We need records of prior workup we have just continued medication(s) and need more information.    Acquired hypothyroidism Assessment & Plan: Clinically euthyroid, continue with thyroid hormone replacement. Recheck labs today and adjust medication if needed.  Hypothyroidism: They are on Synthroid, and the last thyroid function test was in October 2023. We will order thyroid function tests to assess the current thyroid status.Stable. We need records of prior workup we have just continued medication(s) and need more information.   Orders: -     TSH -     T4, free  Obesity (BMI 30-39.9) -     Lipid panel  Pseudotumor cerebri            [facility] Address: [address], [place], [place], [address] Phone: [phone] Services: Individual Counseling, Family Therapy, Couples Counseling, Trauma Therapy, Stress Management, Depression Treatment, Anxiety Treatment, and Grief Counseling   Christophe Louis, [facility] Address: [address] [place], [place], [place], [address] Phone: [phone] Specializes in EMDR, PTSD, anxiety, cognitive behavioral therapy, and other disorders   [facility] Address: [address] [place], [place], [place], [address] Phone: [phone] Offers professional counseling specializing in Complex Trauma and Dissociative Disorders, mood disorders, personality disorders, and EMDR 3.  [facility] Address: [address] [place], [place], [address] Phone: [phone] Specializes in trauma,  addiction, and perinatal therapy services 4.  [facility] [address] [place], [place] [address] [phone]

## 2022-12-13 NOTE — Assessment & Plan Note (Signed)
ADHD: They are on Adderall 10mg  three times a day and are satisfied with the current dose. We will continue Adderall 10mg  three times a day.  Discussed XR formulation but she has tried and didn't like.  Holding off on 90 days option through [facility] but will discuss with husband.

## 2022-12-13 NOTE — Assessment & Plan Note (Addendum)
Assessment: Risks and benefits were weighed and continued maintenance of the controlled substance prescription will be provided.   Continued education about risks and benefits and safe use was also provided.  Importance of securing medications has been reviewed.  Relevant comorbid conditions include severe PTSD that she prefers not to discuss.  These factors are taken into account in the overall treatment plan to minimize potential interactions or complications.   I spent 10 minutes today discussing the risks of Klonopin in detail and how to safely taper it as per her preference.  I explained we will need alternative treatment of her PTSD for this to be successful and it should be done very slowly  PDMP reviewed during this encounter.  Reviewed Urine Drug Screening Data in problem overview : plan to complete by 04/2023 Reviewed to determine if controlled substance contract needs updated, plant to re complete by  08/2023   Adherence:  No evidence of misuse or diversion has been identified during this visit, and there is no suspicion of such behavior.  Patient has  been briefed on our diversion prevention protocol, which is integral to our medication management strategy.  Explained and confirmed understanding of the importance of bringing medications in their original containers for verification, the option of submitting time-stamped medication photos via MyChart, and the necessity of routine urine drug screenings.   Terms were agreed upon, signed by written contract, and this is in place to ensure the safe and effective use of medically necessary controlled substance prescriptions, and to fulfill our regulatory obligations.  Adherence to our prescribing agreement has been exemplary, with no indications of misuse or diversion. We will continue to monitor and support the agreed-upon treatment plan, ensuring compliance with safety protocols and regulatory requirements.

## 2022-12-14 ENCOUNTER — Encounter: Payer: Self-pay | Admitting: Internal Medicine

## 2022-12-14 DIAGNOSIS — D751 Secondary polycythemia: Secondary | ICD-10-CM | POA: Insufficient documentation

## 2022-12-14 DIAGNOSIS — E785 Hyperlipidemia, unspecified: Secondary | ICD-10-CM | POA: Insufficient documentation

## 2022-12-14 NOTE — Progress Notes (Signed)
Assessment: 1. Incidental polycythemia (elevated Hgb and Hct) 2. Hyperlipidemia 3. Multiple comorbidities, including obesity, hypothyroidism, and anxiety disorders  Plan: 1. Investigate cause of polycythemia:    a. Order erythropoietin (EPO) level    b. Order JAK2, CALR, and MPL mutation testing    c. Schedule abdominal ultrasound to assess spleen size    d. Order iron studies, LDH, and uric acid levels    e. Consider sleep study to rule out obstructive sleep apnea  2. Manage polycythemia:    a. Encourage adequate hydration    b. Recommend low-dose aspirin (81mg  daily)     c. Consider phlebotomy if Hct > 52% or if patient becomes symptomatic- I don't think she needs yet  3. Address hyperlipidemia:    a. Repeat lipid panel for 3-61months    b. Encourage lifestyle modifications: diet rich in fruits, vegetables, whole grains, and lean proteins; regular exercise (aim for 150 minutes/week)    c. Consider statin therapy based on ASCVD risk score (currently only 1.5% so not needed but we can send rosuvastatin 20 if she'd like to try based on high risk family history)  4. Follow-up:    a. Schedule follow-up appointment in 3-4 weeks to review test results    b. Monitor CBC every 3-4 months initially

## 2022-12-18 ENCOUNTER — Other Ambulatory Visit: Payer: Self-pay | Admitting: Internal Medicine

## 2022-12-18 DIAGNOSIS — G8929 Other chronic pain: Secondary | ICD-10-CM

## 2023-01-04 ENCOUNTER — Other Ambulatory Visit: Payer: Self-pay | Admitting: Internal Medicine

## 2023-01-04 DIAGNOSIS — F5104 Psychophysiologic insomnia: Secondary | ICD-10-CM

## 2023-01-10 ENCOUNTER — Ambulatory Visit: Payer: Federal, State, Local not specified - PPO | Admitting: Internal Medicine

## 2023-02-10 ENCOUNTER — Other Ambulatory Visit: Payer: Self-pay | Admitting: Internal Medicine

## 2023-02-10 DIAGNOSIS — F5104 Psychophysiologic insomnia: Secondary | ICD-10-CM

## 2023-02-11 ENCOUNTER — Other Ambulatory Visit: Payer: Self-pay | Admitting: Internal Medicine

## 2023-02-11 DIAGNOSIS — G8929 Other chronic pain: Secondary | ICD-10-CM

## 2023-03-08 ENCOUNTER — Other Ambulatory Visit: Payer: Self-pay | Admitting: Internal Medicine

## 2023-03-08 DIAGNOSIS — F908 Attention-deficit hyperactivity disorder, other type: Secondary | ICD-10-CM

## 2023-03-11 ENCOUNTER — Encounter: Payer: Self-pay | Admitting: Internal Medicine

## 2023-03-12 NOTE — Telephone Encounter (Signed)
Patient called wanting to know if she could get worked in sooner since out of medication. Please Advise.

## 2023-03-15 ENCOUNTER — Encounter: Payer: Self-pay | Admitting: Internal Medicine

## 2023-03-15 ENCOUNTER — Ambulatory Visit: Payer: Federal, State, Local not specified - PPO | Admitting: Internal Medicine

## 2023-03-15 VITALS — BP 130/81 | HR 88 | Temp 97.6°F | Ht <= 58 in | Wt 250.4 lb

## 2023-03-15 DIAGNOSIS — F908 Attention-deficit hyperactivity disorder, other type: Secondary | ICD-10-CM

## 2023-03-15 DIAGNOSIS — R638 Other symptoms and signs concerning food and fluid intake: Secondary | ICD-10-CM

## 2023-03-15 DIAGNOSIS — F902 Attention-deficit hyperactivity disorder, combined type: Secondary | ICD-10-CM

## 2023-03-15 DIAGNOSIS — E669 Obesity, unspecified: Secondary | ICD-10-CM

## 2023-03-15 DIAGNOSIS — Z79899 Other long term (current) drug therapy: Secondary | ICD-10-CM | POA: Diagnosis not present

## 2023-03-15 DIAGNOSIS — F5101 Primary insomnia: Secondary | ICD-10-CM

## 2023-03-15 MED ORDER — SEMAGLUTIDE-WEIGHT MANAGEMENT 0.25 MG/0.5ML ~~LOC~~ SOAJ
0.2500 mg | SUBCUTANEOUS | 0 refills | Status: AC
Start: 2023-03-15 — End: 2023-04-12

## 2023-03-15 MED ORDER — SEMAGLUTIDE-WEIGHT MANAGEMENT 1.7 MG/0.75ML ~~LOC~~ SOAJ
1.7000 mg | SUBCUTANEOUS | 0 refills | Status: AC
Start: 2023-06-10 — End: 2023-07-08

## 2023-03-15 MED ORDER — AMPHETAMINE-DEXTROAMPHETAMINE 10 MG PO TABS
10.0000 mg | ORAL_TABLET | Freq: Three times a day (TID) | ORAL | 0 refills | Status: DC
Start: 2023-03-15 — End: 2023-06-19

## 2023-03-15 MED ORDER — AMPHETAMINE-DEXTROAMPHETAMINE 10 MG PO TABS
10.0000 mg | ORAL_TABLET | Freq: Three times a day (TID) | ORAL | 0 refills | Status: DC
Start: 2023-05-14 — End: 2023-06-19

## 2023-03-15 MED ORDER — SEMAGLUTIDE-WEIGHT MANAGEMENT 0.5 MG/0.5ML ~~LOC~~ SOAJ
0.5000 mg | SUBCUTANEOUS | 0 refills | Status: AC
Start: 2023-04-13 — End: 2023-05-11

## 2023-03-15 MED ORDER — SEMAGLUTIDE-WEIGHT MANAGEMENT 1 MG/0.5ML ~~LOC~~ SOAJ
1.0000 mg | SUBCUTANEOUS | 0 refills | Status: AC
Start: 2023-05-12 — End: 2023-06-09

## 2023-03-15 MED ORDER — AMPHETAMINE-DEXTROAMPHETAMINE 10 MG PO TABS
10.0000 mg | ORAL_TABLET | Freq: Three times a day (TID) | ORAL | 0 refills | Status: DC
Start: 2023-04-14 — End: 2023-06-19

## 2023-03-15 MED ORDER — SEMAGLUTIDE-WEIGHT MANAGEMENT 2.4 MG/0.75ML ~~LOC~~ SOAJ
2.4000 mg | SUBCUTANEOUS | 0 refills | Status: DC
Start: 2023-07-09 — End: 2023-07-24

## 2023-03-15 NOTE — Assessment & Plan Note (Signed)
Stable on Adderall 10mg  TID without side effects. We will continue Adderall 10mg  TID and implement a diversion prevention plan requiring her to take a picture of the half-empty bottle monthly and bring any unused pills to non-ADHD related visits.

## 2023-03-15 NOTE — Assessment & Plan Note (Signed)
This patient does not have any significant contraindications for stimulant use such as hypertension, tachycardia, arrhythmia, psychosis, bipolar disorder, severe anorexia, and Tourette syndrome. her prior records have been reviewed to confirm the diagnosis of attention deficit disorder/ADHD. She was given an opportunity to ask questions/clarifications about any aspect of the diagnosis and treatment plan at today's visit. PDMP reviewed during this encounter. as part of continued controlled substance prescribing.  No barriers to understanding were identified  Follow up visit in 90 days for next prescription was recommended

## 2023-03-15 NOTE — Patient Instructions (Signed)
VISIT SUMMARY:  During your visit, we discussed your ongoing management of ADHD, sleep disturbances, hypertension, and your interest in weight loss. You reported that your current medications for ADHD and sleep disturbances are effective, but you are experiencing increased difficulty focusing due to job-related stress. We also discussed your desire to lose weight and manage sugar cravings, and you expressed interest in a new medication, Zepa. Your hypertension appears to be well-managed with your current medication, and we reviewed general health maintenance recommendations.  YOUR PLAN:  -ADHD: Attention Deficit Hyperactivity Disorder (ADHD) is a condition characterized by symptoms such as inattention, hyperactivity, and impulsivity. Your current medication, Adderall 10mg  three times daily, will be continued as it has been effective. To prevent medication diversion, you will need to take a picture of your half-empty bottle monthly and bring any unused pills to non-ADHD related visits.  -INSOMNIA: Insomnia is a sleep disorder where you have trouble falling or staying asleep. Your sleep disturbances are well controlled with Prazosin 2mg  nightly, and we will continue this medication. We also encourage you to use sleep evaluation apps to work towards not depending on medication.  -OBESITY: Obesity is a condition characterized by excessive body fat. You expressed interest in losing weight and managing sugar cravings. We will prescribing Wegovy due to Zepbound not covered for weight loss if your insurance covers it. Additionally, we encourage you to practice mindfulness and adopt healthy lifestyle habits.  -HYPERTENSION: Hypertension, or high blood pressure, is a condition where the force of the blood against your artery walls is too high. Your blood pressure readings varied during the visit, but we will continue your current medication and recheck your blood pressure at the next visit.  -GENERAL HEALTH  MAINTENANCE:  we encourage you to get the flu vaccine.  INSTRUCTIONS:  Please take a picture of your half-empty Adderall bottle monthly and bring any unused pills to non-ADHD related visits. We will recheck your blood pressure at your next visit. If your insurance covers Zepa, we will consider prescribing it for weight loss. Continue using sleep evaluation apps to help manage your insomnia. Don't forget to get your flu vaccine.

## 2023-03-15 NOTE — Assessment & Plan Note (Signed)
BMI of 1222 due to incorrect height entry, with expressed interest in weight loss. We wil trial Wegovy due to Zepbound not covered for weight losand encourage her to practice mindfulness and healthy lifestyle habits.

## 2023-03-15 NOTE — Progress Notes (Signed)
Cassidy Hopkins: [phone]   Routine Medical Office Visit  Patient:  Cassidy Hopkins      Age: 47 y.o.       Sex:  female  Date:   03/15/2023  Patient Care Team: Cassidy Olszewski, MD as PCP - General (Internal Medicine) Cassidy Pippin, MD as Attending Physician (Urology) Today's Healthcare Provider: Lula Olszewski, MD   Assessment  Assessment/Plan:   Cassidy Hopkins was seen today for adhd.  ADHD (attention deficit hyperactivity disorder), combined type Overview: Officially tested 05/2020. Results scanned.  Pleased with long term 10 mg Adderall three times daily dosing   Assessment & Plan: Stable on Adderall 10mg  TID without side effects. We will continue Adderall 10mg  TID and implement a diversion prevention plan requiring her to take a picture of the half-empty bottle monthly and bring any unused pills to non-ADHD related visits.   High risk medication use Overview: Indication for controlled substance: add and complex anxiety/PTSD; these prescriptions were taken over from prior provider Dr. Kateri Plummer # pills per month: 90 of Adderall and 45 Klonopin, tapering Klonopin after discussing risks and benefits  Last UDS date: 04/24/22 Controlled substance contract signed (Y/N): Yes,09/13/22 completed Diversion Prevention:  risk of discontinuation prescribing  with legal or substance use issues, we do pill counts for diversion prevention.  Assessment & Plan: This patient does not have any significant contraindications for stimulant use such as hypertension, tachycardia, arrhythmia, psychosis, bipolar disorder, severe anorexia, and Tourette syndrome. her prior records have been reviewed to confirm the diagnosis of attention deficit disorder/ADHD. She was given an opportunity to ask questions/clarifications about any aspect of the diagnosis and treatment plan at today's visit. PDMP reviewed during this encounter. as part of continued controlled substance prescribing.  No barriers to  understanding were identified  Follow up visit in 90 days for next prescription was recommended   Other specified attention deficit hyperactivity disorder (ADHD) -     Amphetamine-Dextroamphetamine; Take 1 tablet (10 mg total) by mouth in the morning, at noon, and at bedtime.  Dispense: 90 tablet; Refill: 0 -     Amphetamine-Dextroamphetamine; Take 1 tablet (10 mg total) by mouth in the morning, at noon, and at bedtime.  Dispense: 90 tablet; Refill: 0 -     Amphetamine-Dextroamphetamine; Take 1 tablet (10 mg total) by mouth in the morning, at noon, and at bedtime.  Dispense: 90 tablet; Refill: 0  Weight disorder -     Semaglutide-Weight Management; Inject 0.25 mg into the skin once a week for 28 days.  Dispense: 2 mL; Refill: 0 -     Semaglutide-Weight Management; Inject 0.5 mg into the skin once a week for 28 days.  Dispense: 2 mL; Refill: 0 -     Semaglutide-Weight Management; Inject 1 mg into the skin once a week for 28 days.  Dispense: 2 mL; Refill: 0 -     Semaglutide-Weight Management; Inject 1.7 mg into the skin once a week for 28 days.  Dispense: 3 mL; Refill: 0 -     Semaglutide-Weight Management; Inject 2.4 mg into the skin once a week for 28 days.  Dispense: 3 mL; Refill: 0  Primary insomnia Assessment & Plan: Well controlled on Prazosin 2mg  nightly. We will continue Prazosin 2mg  nightly and encourage her to work towards not depending on any medicine by using sleep evaluation apps.   Obesity (BMI 30-39.9) Assessment & Plan: BMI of 1222 due to incorrect height entry, with expressed interest in weight loss. We wil trial [facility]  due to Zepbound not covered for weight losand encourage her to practice mindfulness and healthy lifestyle habits.          Subjective:   Cassidy Hopkins is a 47 y.o. female presented today to address the following problems listed in the chief complaint: Chief Complaint  Patient presents with   ADHD    Medication refill of adderall.     HPI    Cassidy Hopkins is here today for follow up of ADD/ADHD. She reports she is taking medication as directed and continues to feel it is beneficial. The medications continue to help with focus and attention and task completion. She denies adverse side effects; specifically no headaches, appetite suppression, weight loss, sleeping difficulty, heart palpitations, chest pain or significant weight changes.  She requests the medicine be left the same dose at this time.    Discussed the use of AI scribe software for clinical note transcription with the patient, who gave verbal consent to proceed.  History of Present Illness   The patient, with a history of ADHD, presents for a routine follow-up visit to discuss her ongoing medication regimen. She has been on a stable dose of Adderall (10mg  three times daily) for an extended period, which she reports has been effective in managing her symptoms. However, she recently started a new job and has noticed increased difficulty focusing over the past few days, which she attributes to the stress of the new position.  The patient also reports a history of sleep disturbances, which have been successfully managed with Prazosin 2mg  nightly. She describes a positive response to this medication, noting that it allows her to fall asleep easily after her nightly routine. She expresses satisfaction with this regimen and has no desire to make changes at this time.  In addition to ADHD and sleep disturbances, the patient has a history of hypertension, managed with an unspecified medication. She reports no side effects or concerns related to this medication.  The patient also discusses her struggle with weight and sugar addiction, expressing a desire to lose approximately 50 pounds. She expresses interest in a new medication, Zepa, which she hopes will assist in weight loss and curb her sugar cravings. She is aware of the potential side effects, including nausea and constipation, but is  willing to proceed if her insurance covers the medication.  The patient denies any illicit drug use and is compliant with her medication regimen, understanding the importance of not diverting her prescribed medications. She agrees to provide periodic evidence of her medication compliance by sending pictures of her half-empty medication bottles.  Overall, the patient appears to be managing her chronic conditions effectively with her current medication regimen, but is seeking additional support for weight loss and sugar addiction.       The following chart data was reviewed: Patient Active Problem List   Diagnosis Date Noted   Hyperlipidemia 12/14/2022   Polycythemia 12/14/2022   Herniation of intervertebral disc between L5 and S1 09/13/2022   History of cholecystectomy 04/24/2022   PTSD (post-traumatic stress disorder) 04/24/2022   High risk medication use 04/24/2022   ADHD (attention deficit hyperactivity disorder), combined type 07/22/2020   Insomnia 05/08/2019   Bilateral kidney stones 11/04/2018   Social anxiety disorder 12/03/2017   Multiple lipomas 07/07/2016   Generalized anxiety disorder 03/31/2016   Hypothyroidism 01/17/2016   Pseudotumor cerebri 01/17/2016   Obesity (BMI 30-39.9) 01/17/2016   Current Meds  Medication Sig   acetaZOLAMIDE (DIAMOX) 250 MG tablet TAKE 1 TABLET BY MOUTH  DAILY. PLEASE FIND NEW PROVIDER   cyclobenzaprine (FLEXERIL) 5 MG tablet TAKE 1 TABLET BY MOUTH THREE TIMES A DAY AS NEEDED FOR MUSCLE SPASM. NEED TO FIND NEW PROVIDER   hydrochlorothiazide (HYDRODIURIL) 25 MG tablet Take 1 tablet (25 mg total) by mouth daily.   levothyroxine (SYNTHROID) 100 MCG tablet Take 1 tablet (100 mcg total) by mouth daily before breakfast.   prazosin (MINIPRESS) 2 MG capsule TAKE 1 CAPSULE BY MOUTH AT BEDTIME.   Semaglutide-Weight Management 0.25 MG/0.5ML SOAJ Inject 0.25 mg into the skin once a week for 28 days.   [START ON 04/13/2023] Semaglutide-Weight Management 0.5  MG/0.5ML SOAJ Inject 0.5 mg into the skin once a week for 28 days.   [START ON 05/12/2023] Semaglutide-Weight Management 1 MG/0.5ML SOAJ Inject 1 mg into the skin once a week for 28 days.   [START ON 06/10/2023] Semaglutide-Weight Management 1.7 MG/0.75ML SOAJ Inject 1.7 mg into the skin once a week for 28 days.   [START ON 07/09/2023] Semaglutide-Weight Management 2.4 MG/0.75ML SOAJ Inject 2.4 mg into the skin once a week for 28 days.   venlafaxine XR (EFFEXOR-XR) 75 MG 24 hr capsule TAKE 1 CAPSULE BY MOUTH EVERY DAY WITH BREAKFAST   [DISCONTINUED] amphetamine-dextroamphetamine (ADDERALL) 10 MG tablet Take 1 tablet (10 mg total) by mouth in the morning, at noon, and at bedtime.    Problem-focused charting was updated in her chart at  today's medical interview as follows: Problem  High Risk Medication Use   Indication for controlled substance: add and complex anxiety/PTSD; these prescriptions were taken over from prior provider Dr. Kateri Plummer # pills per month: 90 of Adderall and 45 Klonopin, tapering Klonopin after discussing risks and benefits  Last UDS date: 04/24/22 Controlled substance contract signed (Y/N): Yes,09/13/22 completed Diversion Prevention:  risk of discontinuation prescribing  with legal or substance use issues, we do pill counts for diversion prevention.   Adhd (Attention Deficit Hyperactivity Disorder), Combined Type   Officially tested 05/2020. Results scanned.  Pleased with long term 10 mg Adderall three times daily dosing    Insomnia  Obesity (Bmi 30-39.9)        Objective:  Physical Exam  BP 130/81 (BP Location: Left Arm, Patient Position: Sitting)   Pulse 88   Temp 97.6 F (36.4 C) (Temporal)   Ht 1' (0.305 m)   Wt 250 lb 6.4 oz (113.6 kg)   SpO2 97%   BMI 1222.57 kg/m  Vital signs reviewed.  Nursing notes reviewed. General Appearance/Constitutional:   truncal adiposity   female in no acute distress Musculoskeletal: All extremities are intact.  Neurological:   Awake, alert,  No obvious focal neurological deficits or cognitive impairments Psychiatric:  Appropriate mood, pleasant demeanor Problem-specific findings:  attentiveness and hyperactivity seem grossly appropriate, no pressured speech, delusions, or responding to external stimuli

## 2023-03-15 NOTE — Assessment & Plan Note (Signed)
Well controlled on Prazosin 2mg  nightly. We will continue Prazosin 2mg  nightly and encourage her to work towards not depending on any medicine by using sleep evaluation apps.

## 2023-04-11 ENCOUNTER — Other Ambulatory Visit: Payer: Self-pay | Admitting: Internal Medicine

## 2023-04-11 DIAGNOSIS — G8929 Other chronic pain: Secondary | ICD-10-CM

## 2023-05-04 ENCOUNTER — Other Ambulatory Visit: Payer: Self-pay | Admitting: Internal Medicine

## 2023-05-04 DIAGNOSIS — N951 Menopausal and female climacteric states: Secondary | ICD-10-CM

## 2023-05-04 DIAGNOSIS — M797 Fibromyalgia: Secondary | ICD-10-CM

## 2023-05-14 ENCOUNTER — Telehealth: Payer: Self-pay | Admitting: Internal Medicine

## 2023-05-14 NOTE — Telephone Encounter (Signed)
Final outcome: I called patient and was able to inform her that medication has been sent in. Patient stated she was aware and she thanks [place] for the assistance.    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 [name] states she picked her medication up on the 23rd of last month and wants to know if she can get it today instead of waiting till tomorrow. Translation No Disp. Time Lamount Cohen Time) Disposition Final User 05/13/2023 9:39:58 PM Send To Nurse Maudry Mayhew, RN, [facility] 05/14/2023 6:56:18 AM Send to Clinical Angie Fava, RN, [facility] 05/14/2023 1:05:25 PM FINAL ATTEMPT MADE - no message left Yes Kathlen Mody, RN, [facility] 05/14/2023 1:05:31 PM Send to RN Final Attempt Candee Furbish, RN, [facility] Final Disposition 05/14/2023 1:05:25 PM FINAL ATTEMPT MADE - no message left Yes Kathlen Mody, RN, [facility]

## 2023-06-11 ENCOUNTER — Other Ambulatory Visit: Payer: Self-pay | Admitting: Internal Medicine

## 2023-06-11 ENCOUNTER — Ambulatory Visit: Payer: Federal, State, Local not specified - PPO | Admitting: Internal Medicine

## 2023-06-11 DIAGNOSIS — F908 Attention-deficit hyperactivity disorder, other type: Secondary | ICD-10-CM

## 2023-06-11 NOTE — Telephone Encounter (Signed)
03/15/2023 LOV  03/15/2023 Fill Date  90/0 refills

## 2023-06-19 ENCOUNTER — Encounter: Payer: Self-pay | Admitting: Internal Medicine

## 2023-06-19 ENCOUNTER — Ambulatory Visit: Payer: Federal, State, Local not specified - PPO | Admitting: Internal Medicine

## 2023-06-19 VITALS — BP 137/89 | HR 88 | Temp 97.7°F | Wt 252.2 lb

## 2023-06-19 DIAGNOSIS — M5442 Lumbago with sciatica, left side: Secondary | ICD-10-CM

## 2023-06-19 DIAGNOSIS — M5127 Other intervertebral disc displacement, lumbosacral region: Secondary | ICD-10-CM | POA: Diagnosis not present

## 2023-06-19 DIAGNOSIS — M5441 Lumbago with sciatica, right side: Secondary | ICD-10-CM

## 2023-06-19 DIAGNOSIS — Z79899 Other long term (current) drug therapy: Secondary | ICD-10-CM

## 2023-06-19 DIAGNOSIS — F902 Attention-deficit hyperactivity disorder, combined type: Secondary | ICD-10-CM

## 2023-06-19 DIAGNOSIS — G932 Benign intracranial hypertension: Secondary | ICD-10-CM

## 2023-06-19 DIAGNOSIS — G8929 Other chronic pain: Secondary | ICD-10-CM

## 2023-06-19 DIAGNOSIS — F908 Attention-deficit hyperactivity disorder, other type: Secondary | ICD-10-CM | POA: Diagnosis not present

## 2023-06-19 DIAGNOSIS — I10 Essential (primary) hypertension: Secondary | ICD-10-CM

## 2023-06-19 DIAGNOSIS — F411 Generalized anxiety disorder: Secondary | ICD-10-CM

## 2023-06-19 MED ORDER — SERTRALINE HCL 25 MG PO TABS: 25.0000 mg | ORAL_TABLET | Freq: Every day | ORAL | 3 refills | Status: DC

## 2023-06-19 MED ORDER — AMPHETAMINE-DEXTROAMPHETAMINE 10 MG PO TABS: ORAL_TABLET | ORAL | 0 refills | Status: DC

## 2023-06-19 MED ORDER — ACETAZOLAMIDE 250 MG PO TABS: ORAL_TABLET | ORAL | 3 refills | Status: AC

## 2023-06-19 MED ORDER — CYCLOBENZAPRINE HCL 5 MG PO TABS
ORAL_TABLET | ORAL | 2 refills | Status: DC
Start: 2023-06-19 — End: 2023-09-18

## 2023-06-19 NOTE — Assessment & Plan Note (Signed)
Generalized Anxiety Disorder There is a recent increase in anxiety. Discussed starting SSRIs for long-term management, as they have not been used before and are open to treatment. Begin sertraline at a low dose. Refer to behavioral health for therapy, preferably with a female therapist, and consider EMDR therapy for PTSD.

## 2023-06-19 NOTE — Patient Instructions (Addendum)
VISIT SUMMARY:  Today, we discussed the management of your ADHD, anxiety, chronic back pain, and pseudotumor cerebri. We reviewed your current medications and made some adjustments to better address your symptoms and overall health. We also talked about starting new treatments and referrals to specialists for further care.  YOUR PLAN:  -ATTENTION-DEFICIT/HYPERACTIVITY DISORDER (ADHD): ADHD is a condition that affects focus and behavior. You will continue taking Adderall 10 mg three times daily during your night shifts to help maintain alertness. Avoid taking it at bedtime to prevent sleep disruption. A urine drug screen will be done today, and a controlled substance contract will be completed at your next visit. Please upload your pill counts for Adderall on the 13th of each month.  -GENERALIZED ANXIETY DISORDER: Generalized Anxiety Disorder is characterized by excessive worry and anxiety. We discussed starting sertraline at a low dose to help manage your anxiety. You will also be referred to behavioral health for therapy, preferably with a female therapist, and consider EMDR therapy for PTSD.  -CHRONIC BACK PAIN WITH MUSCLE SPASMS: Chronic back pain with muscle spasms can be caused by physical strain, such as lifting heavy patients at work. You will continue using cyclobenzaprine as needed for severe muscle spasms, especially after work. Be mindful of its sedative effects and try to limit long-term use.  -PSEUDOTUMOR CEREBRI: Pseudotumor cerebri is a condition where pressure builds up in the brain, mimicking a tumor. Although you have not noticed changes after stopping acetazolamide, it is important to resume it at a low dose to prevent complications. You will be referred to a neurologist for further management.  INSTRUCTIONS:  Please schedule a follow-up appointment in 1-3 months. If you do not hear from the behavioral health referral within 1-2 weeks, please call our office.     [facility] Address: [address], [place], [place], [address] Phone: [phone] Services: Individual Counseling, Family Therapy, Couples Counseling, Trauma Therapy, Stress Management, Depression Treatment, Anxiety Treatment, and Grief Counseling   Christophe Louis, [facility] Address: [address] [place], [place], [place], [address] Phone: [phone] Specializes in EMDR, PTSD, anxiety, cognitive behavioral therapy, and other disorders   [facility] Address: [address] [place], [place], [place], [address] Phone: [phone] Offers professional counseling specializing in Complex Trauma and Dissociative Disorders, mood disorders, personality disorders, and EMDR 3.  [facility] Address: [address] [place], [place], [address] Phone: [phone] Specializes in trauma, addiction, and perinatal therapy services 4.  [facility] [address] [place], [place] [address] [phone]  Faythe Dingwall counselor doing Eye Movement Desensitization and Reprocessing (EMDR) [phone] [address] [place], [place] [address]   It was a pleasure seeing you today! Your health and satisfaction are our top priorities.  Glenetta Hew, MD  Your Providers PCP: Lula Olszewski, MD,  [phone]) Referring Provider: Lula Olszewski, MD,  [phone]) Care Team Provider: Bjorn Pippin, MD,  [phone])     NEXT STEPS: [x]  Early Intervention: Schedule sooner appointment, call our on-call services, or go to emergency room if there is any significant Increase in pain or discomfort New or worsening symptoms Sudden or severe changes in your health [x]  Flexible Follow-Up: We recommend a Return in about 1 month (around 07/20/2023). for optimal routine care. This allows for progress monitoring and treatment adjustments. [x]  Preventive Care: Schedule your annual preventive care visit! It's typically covered  by insurance and helps  identify potential health issues early. [x]  Lab & X-ray Appointments: Incomplete tests scheduled today, or call to schedule. X-rays: [facility] (M-F, 8:30am-noon or 1pm-5pm). [x]  Medical Information Release: Sign a release form at front desk to obtain relevant medical information we don't have.  MAKING THE MOST OF OUR FOCUSED 20 MINUTE APPOINTMENTS: [x]   Clearly state your top concerns at the beginning of the visit to focus our discussion [x]   If you anticipate you will need more time, please inform the front desk during scheduling - we can book multiple appointments in the same week. [x]   If you have transportation problems- use our convenient video appointments or ask about transportation support. [x]   We can get down to business faster if you use MyChart to update information before the visit and submit non-urgent questions before your visit. Thank you for taking the time to provide details through MyChart.  Let our nurse know and she can import this information into your encounter documents.  Arrival and Wait Times: [x]   Arriving on time ensures that everyone receives prompt attention. [x]   Early morning (8a) and afternoon (1p) appointments tend to have shortest wait times. [x]   Unfortunately, we cannot delay appointments for late arrivals or hold slots during phone calls.  Getting Answers and Following Up [x]   Simple Questions & Concerns: For quick questions or basic follow-up after your visit, reach [place] at [phone] or MyChart messaging. [x]   Complex Concerns: If your concern is more complex, scheduling an appointment might be best. Discuss this with the staff to find the most suitable option. [x]   Lab & Imaging Results: We'll contact you directly if results are abnormal or you don't use MyChart. Most normal results will be on MyChart within 2-3 business days, with a review message from Dr. Jon Billings. Haven't heard back in 2 weeks? Need results  sooner? Contact us at [phone]. [x]   Referrals: Our referral coordinator will manage specialist referrals. The specialist's office should contact you within 2 weeks to schedule an appointment. Call us if you haven't heard from them after 2 weeks.  Staying Connected [x]   MyChart: Activate your MyChart for the fastest way to access results and message [place]. See the last page of this paperwork for instructions on how to activate.  Bring to Your Next Appointment [x]   Medications: Please bring all your medication bottles to your next appointment to ensure we have an accurate record of your prescriptions. [x]   Health Diaries: If you're monitoring any health conditions at home, keeping a diary of your readings can be very helpful for discussions at your next appointment.  Billing [x]   X-ray & Lab Orders: These are billed by separate companies. Contact the invoicing company directly for questions or concerns. [x]   Visit Charges: Discuss any billing inquiries with our administrative services team.  Your Satisfaction Matters [x]   Share Your Experience: We strive for your satisfaction! If you have any complaints, or preferably compliments, please let Dr. Jon Billings know directly or contact our Practice Administrators, Edwena Felty or [employer], by asking at the front desk.   Reviewing Your Records [x]   Review this early draft of your clinical encounter notes below and the final encounter summary tomorrow on MyChart after its been completed.  All orders placed so far are visible here: ADHD (attention deficit hyperactivity disorder), combined type  Other specified attention deficit hyperactivity disorder (ADHD) -     Amphetamine-Dextroamphetamine; Take 3 times daily  Dispense: 90 tablet; Refill: 0 -  Amphetamine-Dextroamphetamine; Take 3 times daily  Dispense: 90 tablet; Refill: 0 -     Amphetamine-Dextroamphetamine; Take 3 times daily  Dispense: 90 tablet; Refill: 0  Chronic bilateral low  back pain with bilateral sciatica -     Cyclobenzaprine HCl; TAKE 1 TABLET BY MOUTH THREE TIMES A DAY AS NEEDED FOR MUSCLE SPASM.  Limit use to severe spasm  Dispense: 30 tablet; Refill: 2  Herniation of intervertebral disc between L5 and S1  High risk medication use -     DRUG MONITOR, PANEL 5, SCREEN, URINE  Primary hypertension  Pseudotumor cerebri -     acetaZOLAMIDE; TAKE 1 TABLET BY MOUTH DAILY. PLEASE FIND NEW PROVIDER  Dispense: 90 tablet; Refill: 3 -     Ambulatory referral to Neurology  Generalized anxiety disorder -     Ambulatory referral to Psychology -     Sertraline HCl; Take 1 tablet (25 mg total) by mouth daily.  Dispense: 30 tablet; Refill: 3

## 2023-06-19 NOTE — Assessment & Plan Note (Signed)
Attention-Deficit/Hyperactivity Disorder (ADHD) ADHD is managed with Adderall 10 mg three times daily, which is taken during night shifts to maintain alertness. It is important not to take Adderall at bedtime to avoid sleep disruption. A urine drug screen will be performed today, and a controlled substance contract will be completed at the next visit. Continue Adderall 10 mg three times daily, with updated prescription instructions. Upload pill counts for Adderall on the 13th of each month.

## 2023-06-19 NOTE — Assessment & Plan Note (Signed)
Pseudotumor Cerebri Pseudotumor cerebri is managed with acetazolamide. They have been off acetazolamide for a week with no significant changes. It is important to continue acetazolamide to prevent intracranial hypertension and potential complications. Resume acetazolamide at a low dose and refer to a neurologist for further management.

## 2023-06-19 NOTE — Progress Notes (Signed)
==============================  [facility]: [phone]   -- Medical Office Visit --  Patient: Cassidy Hopkins      Age: 48 y.o.       Sex:  female  Date:   06/19/2023 Today's Healthcare Provider: Lula Olszewski, MD  ==============================   CHIEF COMPLAINT: Medication Refill (Adderall.)  SUBJECTIVE: Background This is a 48 y.o. female who has Hypothyroidism; Pseudotumor cerebri; Obesity (BMI 30-39.9); Generalized anxiety disorder; Multiple lipomas; Social anxiety disorder; Bilateral kidney stones; Insomnia; ADHD (attention deficit hyperactivity disorder), combined type; History of cholecystectomy; PTSD (post-traumatic stress disorder); High risk medication use; Herniation of intervertebral disc between L5 and S1; Hyperlipidemia; and Polycythemia on their problem list.  History of Present Illness The patient is a 48 year old female with ADHD and pseudotumor cerebri who presents for medication management and follow-up.  She manages her ADHD with Adderall, taking 10 mg three times a day, primarily during her night shifts to maintain alertness. She avoids taking it at bedtime as it disrupts her sleep. She finds the current dosing effective for her needs.  She has a history of pseudotumor cerebri and was initially prescribed acetazolamide due to swelling in her eyes from fluid buildup. She started with four tablets a day and was weaned down to one. She has not taken it for a week and has not noticed any difference, possibly due to being in menopause for six months. She is uncertain if she still needs the medication.  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 She uses cyclobenzaprine for relief but tries not to use it if her back is not hurting.  She has a history of anxiety and has been off clonidine. She describes having a 'really bad day' recently and is considering starting an  SSRI for anxiety management. She has not taken SSRIs before and is concerned about their effects. She discusses a significant family history of mental health issues, including her sister's suicide and her mother's death following a refusal to eat. She acknowledges a history of trauma and is open to therapy, preferring a female therapist.   Reviewed chart records that patient  has a past medical history of Adult ADHD, Anxiety, Depression, Hyperlipidemia, Hypertension, Irregular heart rate, Migraines, Panic attack, Right ureteral stone (11/04/2018), and Thyroid disease.    Problem list overviews that were updated at today's visit: Problem  High Risk Medication Use   Indication for controlled substance: add and complex anxiety/PTSD; these prescriptions were taken over from prior provider Dr. Kateri Plummer # pills per month: 90 of Adderall Last UDS date: 04/24/22 Controlled substance contract signed (Y/N): Yes,09/13/22 completed Diversion Prevention:  risk of discontinuation prescribing  with legal or substance use issues, we do pill counts for diversion prevention.     Today's Verbally Confirmed Medications - Adderall - Cyclobenzaprine - Acetazolamide Current Outpatient Medications on File Prior to Visit  Medication Sig   hydrochlorothiazide (HYDRODIURIL) 25 MG tablet Take 1 tablet (25 mg total) by mouth daily.   levothyroxine (SYNTHROID) 100 MCG tablet Take 1 tablet (100 mcg total) by mouth daily before breakfast.   prazosin (MINIPRESS) 2 MG capsule TAKE 1 CAPSULE BY MOUTH AT BEDTIME.   Semaglutide-Weight Management 1.7 MG/0.75ML SOAJ Inject 1.7 mg into the skin once a week for 28 days.   [START ON 07/09/2023] Semaglutide-Weight Management 2.4 MG/0.75ML SOAJ Inject 2.4 mg into the skin once a week for 28 days.   venlafaxine XR (EFFEXOR-XR) 75  MG 24 hr capsule TAKE 1 CAPSULE BY MOUTH EVERY DAY WITH BREAKFAST   No current facility-administered medications on file prior to visit.   Medications  Discontinued During This Encounter  Medication Reason   amphetamine-dextroamphetamine (ADDERALL) 10 MG tablet    amphetamine-dextroamphetamine (ADDERALL) 10 MG tablet    acetaZOLAMIDE (DIAMOX) 250 MG tablet Reorder   amphetamine-dextroamphetamine (ADDERALL) 10 MG tablet    cyclobenzaprine (FLEXERIL) 5 MG tablet Reorder      Objective   Physical Exam     06/19/2023   12:02 PM 03/15/2023   10:02 AM 03/15/2023    9:47 AM  Vitals with BMI  Height   1\' 0"   Weight 252 lbs 3 oz  250 lbs 6 oz  BMI   1220.97  Systolic 137 130 161  Diastolic 89 81 98  Pulse 88  88   Wt Readings from Last 10 Encounters:  06/19/23 252 lb 3.2 oz (114.4 kg)  03/15/23 250 lb 6.4 oz (113.6 kg)  12/13/22 246 lb 3.2 oz (111.7 kg)  09/13/22 243 lb 6.4 oz (110.4 kg)  04/24/22 240 lb 6.4 oz (109 kg)  02/12/22 240 lb (108.9 kg)  01/20/22 243 lb (110.2 kg)  03/10/21 238 lb 3.2 oz (108 kg)  01/25/21 245 lb (111.1 kg)  08/19/20 240 lb (108.9 kg)   Vital signs reviewed.  Nursing notes reviewed. Weight trend reviewed. Abnormalities and Problem-Specific physical exam findings:  stressed about having a rough weak, nearly cried  General Appearance:  No acute distress appreciable.   Well-groomed, healthy-appearing female.  Well proportioned with no abnormal fat distribution.  Good muscle tone. Pulmonary:  Normal work of breathing at rest, no respiratory distress apparent. SpO2: 98 %  Musculoskeletal: All extremities are intact.  Neurological:  Awake, alert, oriented, and engaged.  No obvious focal neurological deficits or cognitive impairments.  Sensorium seems unclouded.   Speech is clear and coherent with logical content. Psychiatric:  Appropriate mood, pleasant and cooperative demeanor, thoughtful and engaged during the exam    No results found for any visits on 06/19/23. Office Visit on 12/13/2022  Component Date Value   Cholesterol 12/13/2022 247 (H)    Triglycerides 12/13/2022 134.0    HDL 12/13/2022 64.10     VLDL 12/13/2022 26.8    LDL Cholesterol 12/13/2022 156 (H)    Total CHOL/HDL Ratio 12/13/2022 4    NonHDL 12/13/2022 182.63    TSH 12/13/2022 5.33    Sodium 12/13/2022 136    Potassium 12/13/2022 3.5    Chloride 12/13/2022 100    CO2 12/13/2022 27    Glucose, Bld 12/13/2022 109 (H)    BUN 12/13/2022 14    Creatinine, Ser 12/13/2022 0.78    Total Bilirubin 12/13/2022 0.4    Alkaline Phosphatase 12/13/2022 105    AST 12/13/2022 18    ALT 12/13/2022 15    Total Protein 12/13/2022 7.6    Albumin 12/13/2022 4.1    GFR 12/13/2022 89.28    Calcium 12/13/2022 9.5    WBC 12/13/2022 7.4    RBC 12/13/2022 5.18 (H)    Hemoglobin 12/13/2022 15.7 (H)    HCT 12/13/2022 47.2 (H)    MCV 12/13/2022 91.0    MCHC 12/13/2022 33.2    RDW 12/13/2022 13.0    Platelets 12/13/2022 415.0 (H)    Neutrophils Relative % 12/13/2022 59.1    Lymphocytes Relative 12/13/2022 29.6    Monocytes Relative 12/13/2022 5.1    Eosinophils Relative 12/13/2022 5.2 (H)    Basophils Relative 12/13/2022  1.0    Neutro Abs 12/13/2022 4.4    Lymphs Abs 12/13/2022 2.2    Monocytes Absolute 12/13/2022 0.4    Eosinophils Absolute 12/13/2022 0.4    Basophils Absolute 12/13/2022 0.1    Free T4 12/13/2022 0.81   No image results found. No results found.No results found.     Assessment & Plan ADHD (attention deficit hyperactivity disorder), combined type Attention-Deficit/Hyperactivity Disorder (ADHD) ADHD is managed with Adderall 10 mg three times daily, which is taken during night shifts to maintain alertness. It is important not to take Adderall at bedtime to avoid sleep disruption. A urine drug screen will be performed today, and a controlled substance contract will be completed at the next visit. Continue Adderall 10 mg three times daily, with updated prescription instructions. Upload pill counts for Adderall on the 13th of each month. 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 Cyclobenzaprine is used as needed for muscle spasms, mainly after work. Discussed the sedative effects and the importance of limiting long-term use. Renew cyclobenzaprine prescription with updated instructions to use as needed for severe spasms. Herniation of intervertebral disc between L5 and S1  High risk medication use  Primary hypertension  Pseudotumor cerebri Pseudotumor Cerebri Pseudotumor cerebri is managed with acetazolamide. They have been off acetazolamide for a week with no significant changes. It is important to continue acetazolamide to prevent intracranial hypertension and potential complications. Resume acetazolamide at a low dose and refer to a neurologist for further management. Generalized anxiety disorder Generalized Anxiety Disorder There is a recent increase in anxiety. Discussed starting SSRIs for long-term management, as they have not been used before and are open to treatment. Begin sertraline at a low dose. Refer to behavioral health for therapy, preferably with a female therapist, and consider EMDR therapy for PTSD. Follow-up Schedule a follow-up appointment in 1-3 months. Call if there is no contact from the behavioral health referral within 1-2 weeks.     Orders Placed During this Encounter:   Orders Placed This Encounter  Procedures   Ambulatory referral to Psychology    Referral Priority:   Routine    Referral Reason:   Specialty Services Required    Number of Visits Requested:   1   Meds ordered this encounter  Medications   amphetamine-dextroamphetamine (ADDERALL) 10 MG tablet    Sig: Take 3 times daily    Dispense:  90 tablet    Refill:  0   amphetamine-dextroamphetamine (ADDERALL) 10 MG tablet    Sig: Take 3 times daily    Dispense:  90 tablet    Refill:  0    amphetamine-dextroamphetamine (ADDERALL) 10 MG tablet    Sig: Take 3 times daily    Dispense:  90 tablet    Refill:  0   cyclobenzaprine (FLEXERIL) 5 MG tablet    Sig: TAKE 1 TABLET BY MOUTH THREE TIMES A DAY AS NEEDED FOR MUSCLE SPASM.  Limit use to severe spasm    Dispense:  30 tablet    Refill:  2   acetaZOLAMIDE (DIAMOX) 250 MG tablet    Sig: TAKE 1 TABLET BY MOUTH DAILY. PLEASE FIND NEW PROVIDER    Dispense:  90 tablet    Refill:  3   sertraline (ZOLOFT) 25 MG tablet    Sig: Take 1 tablet (25 mg total) by  mouth daily.    Dispense:  30 tablet    Refill:  3   Follow-up Schedule a follow-up appointment in 1-3 months. Call if there is no contact from the behavioral health referral within 1-2 weeks.    This document was synthesized by artificial intelligence (Abridge) using HIPAA-compliant recording of the clinical interaction;   We discussed the use of AI scribe software for clinical note transcription with the patient, who gave verbal consent to proceed.    Additional Info: This encounter employed state-of-the-art, real-time, collaborative documentation. The patient actively reviewed and assisted in updating their electronic medical record on a shared screen, ensuring transparency and facilitating joint problem-solving for the problem list, overview, and plan. This approach promotes accurate, informed care. The treatment plan was discussed and reviewed in detail, including medication safety, potential side effects, and all patient questions. We confirmed understanding and comfort with the plan. Follow-up instructions were established, including contacting the office for any concerns, returning if symptoms worsen, persist, or new symptoms develop, and precautions for potential emergency department visits.

## 2023-06-20 LAB — DRUG MONITOR, PANEL 5, SCREEN, URINE
Amphetamines: NEGATIVE ng/mL (ref <500)
Barbiturates: NEGATIVE ng/mL (ref <300)
Benzodiazepines: NEGATIVE ng/mL (ref <100)
Cocaine Metabolite: NEGATIVE ng/mL (ref <150)
Creatinine: 137.4 mg/dL (ref >=20.0)
Marijuana Metabolite: NEGATIVE ng/mL (ref <20)
Methadone Metabolite: NEGATIVE ng/mL (ref <100)
Opiates: NEGATIVE ng/mL (ref <100)
Oxidant: NEGATIVE ug/mL (ref <200)
Oxycodone: NEGATIVE ng/mL (ref <100)
pH: 6.6 (ref 4.5–9.0)

## 2023-06-20 LAB — DM TEMPLATE

## 2023-06-21 ENCOUNTER — Other Ambulatory Visit (HOSPITAL_COMMUNITY): Payer: Self-pay

## 2023-06-21 ENCOUNTER — Telehealth: Payer: Self-pay

## 2023-06-21 NOTE — Telephone Encounter (Signed)
Pharmacy Patient Advocate Encounter   Received notification from CoverMyMeds that prior authorization for Wegovy 0.25MG /0.5ML auto-injectors is required/requested.   Insurance verification completed.   The patient is insured through [facility] .   Prior Authorization form/request asks a question that requires your assistance. Please see the question below and advise accordingly. The PA will not be submitted until the necessary information is received.

## 2023-06-25 ENCOUNTER — Ambulatory Visit: Payer: Federal, State, Local not specified - PPO | Admitting: Internal Medicine

## 2023-06-26 ENCOUNTER — Encounter: Payer: Self-pay | Admitting: Neurology

## 2023-06-28 ENCOUNTER — Other Ambulatory Visit (HOSPITAL_COMMUNITY): Payer: Self-pay

## 2023-07-02 NOTE — Telephone Encounter (Signed)
 Pharmacy Patient Advocate Encounter  Request expired. Documentation on chart looks like this dosage d/c.

## 2023-07-07 ENCOUNTER — Other Ambulatory Visit: Payer: Self-pay | Admitting: Internal Medicine

## 2023-07-07 DIAGNOSIS — I10 Essential (primary) hypertension: Secondary | ICD-10-CM

## 2023-07-12 ENCOUNTER — Other Ambulatory Visit: Payer: Self-pay | Admitting: Internal Medicine

## 2023-07-12 DIAGNOSIS — F411 Generalized anxiety disorder: Secondary | ICD-10-CM

## 2023-07-17 ENCOUNTER — Other Ambulatory Visit (HOSPITAL_COMMUNITY): Payer: Self-pay

## 2023-07-19 ENCOUNTER — Telehealth: Payer: Self-pay

## 2023-07-19 ENCOUNTER — Other Ambulatory Visit (HOSPITAL_COMMUNITY): Payer: Self-pay

## 2023-07-19 NOTE — Telephone Encounter (Signed)
 Copied from CRM [id number]. Topic: Clinical - Prescription Issue >> Jul 19, 2023  9:15 AM Fredrich Romans wrote: Reason for CRM: Patient needs a PA for medication  amphetamine-dextroamphetamine (ADDERALL) 10 MG tablet'     Requested that PA team start a prior authorization for this medication for patient. Donzetta Starch, CMA

## 2023-07-19 NOTE — Telephone Encounter (Signed)
 Pharmacy Patient Advocate Encounter   Received notification from Physician's Office that prior authorization for Amphetamine-Dextroamphetamine 10MG  tablets is required/requested.   Insurance verification completed.   The patient is insured through [employer] .   Per test claim: PA required and submitted KEY/EOC/Request #: [id number] APPROVED from 07/19/23 to 07/18/24. Ran test claim, Copay is $15. This test claim was processed through [facility]- copay amounts may vary at other pharmacies due to pharmacy/plan contracts, or as the patient moves through the different stages of their insurance plan.   PA Case ID #: [id number]

## 2023-07-20 NOTE — Telephone Encounter (Signed)
 LVM Rx Amphetamine-Dextroamphetamine 10MG  tablets  APPROVED from 07/19/23 to 07/18/24. Contact your pharmacy for refills

## 2023-07-24 ENCOUNTER — Other Ambulatory Visit: Payer: Self-pay

## 2023-07-24 DIAGNOSIS — E669 Obesity, unspecified: Secondary | ICD-10-CM

## 2023-07-24 MED ORDER — WEGOVY 0.25 MG/0.5ML ~~LOC~~ SOAJ: 0.2500 mg | SUBCUTANEOUS | 1 refills | Status: AC

## 2023-07-24 NOTE — Telephone Encounter (Signed)
 Also, advised patient to find out what weight loss medications her insurance company will cover, if any and let us know.

## 2023-07-24 NOTE — Telephone Encounter (Signed)
 Sent my chart message message advising patient of your advice. Also, sent in a new prescription for the lowest dosage of Wegovy.

## 2023-08-01 ENCOUNTER — Ambulatory Visit: Payer: Federal, State, Local not specified - PPO | Admitting: Neurology

## 2023-08-30 ENCOUNTER — Telehealth: Payer: Self-pay

## 2023-08-30 NOTE — Telephone Encounter (Signed)
 Pharmacy Patient Advocate Encounter   Received notification from CoverMyMeds that prior authorization for [facility] 2.4MG /0.75ML auto-injectors is required/requested.   Insurance verification completed.   The patient is insured through [facility] .   Per test claim: PA required; PA submitted to above mentioned insurance via CoverMyMeds Key/confirmation #/EOC [facility] Status is pending

## 2023-08-31 NOTE — Telephone Encounter (Signed)
 Pharmacy Patient Advocate Encounter  Received notification from [facility] that Prior Authorization for [facility] 2.4MG /0.75ML auto-injectors has been DENIED.  Full denial letter will be uploaded to the media tab. See denial reason below.   PA #/Case ID/Reference #: [facility]

## 2023-09-03 NOTE — Telephone Encounter (Signed)
Please see PA denial

## 2023-09-05 NOTE — Telephone Encounter (Signed)
 MyChart message sent to patient advising Dr Markham Silence recommendations/ advise

## 2023-09-07 ENCOUNTER — Telehealth: Payer: Self-pay

## 2023-09-07 ENCOUNTER — Other Ambulatory Visit (HOSPITAL_COMMUNITY): Payer: Self-pay

## 2023-09-07 NOTE — Telephone Encounter (Signed)
 Pharmacy Patient Advocate Encounter   Received notification from CoverMyMeds that prior authorization for WEGOVY  0.25MG /0.5ML is required/requested.   Insurance verification completed.   The patient is insured through [facility] .   Per test claim: PA required; PA submitted to above mentioned insurance via CoverMyMeds Key/confirmation #/EOC [facility] Status is pending  I informed insurance of the weight loss plan the patient is now on, however they do require at least 5% of their baseline body weight lost.

## 2023-09-10 NOTE — Telephone Encounter (Signed)
 Pharmacy Patient Advocate Encounter  Received notification from [facility] that Prior Authorization for Wegovy  0.25MG /0.5ML auto-injectors has been DENIED.  Full denial letter will be uploaded to the media tab. See denial reason below.   PA #/Case ID/Reference #: [id number]

## 2023-09-17 ENCOUNTER — Encounter: Payer: Self-pay | Admitting: Internal Medicine

## 2023-09-17 ENCOUNTER — Ambulatory Visit: Payer: Federal, State, Local not specified - PPO | Admitting: Internal Medicine

## 2023-09-17 VITALS — BP 126/84 | HR 101 | Temp 97.2°F | Ht 66.5 in | Wt 253.2 lb

## 2023-09-17 DIAGNOSIS — E039 Hypothyroidism, unspecified: Secondary | ICD-10-CM | POA: Diagnosis not present

## 2023-09-17 DIAGNOSIS — Z79899 Other long term (current) drug therapy: Secondary | ICD-10-CM

## 2023-09-17 DIAGNOSIS — M797 Fibromyalgia: Secondary | ICD-10-CM

## 2023-09-17 DIAGNOSIS — F411 Generalized anxiety disorder: Secondary | ICD-10-CM | POA: Diagnosis not present

## 2023-09-17 DIAGNOSIS — F902 Attention-deficit hyperactivity disorder, combined type: Secondary | ICD-10-CM

## 2023-09-17 DIAGNOSIS — N951 Menopausal and female climacteric states: Secondary | ICD-10-CM

## 2023-09-17 DIAGNOSIS — F908 Attention-deficit hyperactivity disorder, other type: Secondary | ICD-10-CM

## 2023-09-17 MED ORDER — AMPHETAMINE-DEXTROAMPHETAMINE 10 MG PO TABS: ORAL_TABLET | ORAL | 0 refills | Status: DC

## 2023-09-17 MED ORDER — VENLAFAXINE HCL ER 37.5 MG PO CP24: 37.5000 mg | ORAL_CAPSULE | Freq: Every day | ORAL | 3 refills | Status: AC

## 2023-09-17 MED ORDER — VENLAFAXINE HCL ER 75 MG PO CP24
ORAL_CAPSULE | ORAL | 3 refills | Status: AC
Start: 2023-09-17 — End: ?

## 2023-09-17 MED ORDER — LEVOTHYROXINE SODIUM 100 MCG PO TABS: 100.0000 ug | ORAL_TABLET | Freq: Every day | ORAL | 3 refills | Status: AC

## 2023-09-17 NOTE — Patient Instructions (Signed)
 VISIT SUMMARY:  During your visit, we discussed your ongoing management of depression, anxiety, ADHD, and hypothyroidism. You mentioned experiencing side effects from Zoloft  and have discontinued its use. We also talked about your menopausal symptoms and the recent stress from a custody battle. You are currently satisfied with your ADHD medication and are open to adjusting your Effexor  dosage to better manage your symptoms. Additionally, we addressed your need for a Synthroid  refill.  YOUR PLAN:  -MENOPAUSAL AND PERIMENOPAUSAL DISORDERS: You are experiencing menopausal symptoms, including hot flashes, which are common during this stage of life. We confirmed menopause as you have not had a menstrual period for seven months.  -GENERALIZED ANXIETY DISORDER: Generalized Anxiety Disorder involves persistent and excessive worry about various aspects of life. Your condition is well-managed with Effexor , which also helps reduce hot flashes. We will increase your Effexor  XR dosage to 112.5 mg daily by adding a 37.5 mg pill to your current 75 mg dose.  -ATTENTION-DEFICIT/HYPERACTIVITY DISORDER, COMBINED TYPE: ADHD is a condition characterized by symptoms of inattention, hyperactivity, and impulsivity. Your ADHD is well-controlled with Adderall, and you find your current regimen effective. We will send a 90-day supply of Adderall 10 mg, three times a day, [facility]  -HYPOTHYROIDISM: Hypothyroidism is a condition where the thyroid  gland does not produce enough thyroid  hormone. Your condition is well-managed, and we will refill your Synthroid  prescription for one year.  INSTRUCTIONS:  Please follow up with us  if you experience any new or worsening symptoms. Continue taking your medications as prescribed. We will increase your Effexor  XR dosage to 112.5 mg daily. [facility] for a 90-day supply. Your Synthroid  prescription  has been refilled for one year. If you have any questions or concerns, do not hesitate to contact us .

## 2023-09-17 NOTE — Assessment & Plan Note (Signed)
 ADHD is well-controlled with Adderall, and the current regimen is effective. Send a 90-day supply of Adderall 10 mg, three times a day, in [facility]

## 2023-09-17 NOTE — Progress Notes (Signed)
 ==============================  [facility]: [phone]   -- Medical Office Visit --  Patient: Cassidy Hopkins      Age: 48 y.o.       Sex:  female  Date:   09/17/2023 Today's Healthcare Provider: Anthon Kins, MD  ==============================   Chief Complaint: ADHD (3 month f/u ADHD.  )  History of Present Illness 48 year old female with depression and anxiety who presents for medication management and follow-up.  She experienced side effects from Zoloft , which she discontinued after one week. She feels triggered due to a recent custody battle initiated by her ex-partner, increasing her stress levels. Despite these challenges, she feels stable and is not currently interested in starting a new medication for her depression and anxiety.  She is currently taking Adderall 10 mg twice a day to manage her ADHD symptoms, which she finds beneficial, especially given her night work schedule. She is satisfied with her current regimen and does not wish to make changes at this time.  She is also taking Effexor  75 mg for depression and anxiety. She mentions experiencing hot flashes, which she attributes to menopause, as she has been without a menstrual period for seven months. She is open to adjusting her Effexor  dosage to help manage her symptoms.  She requires a refill of Synthroid , which she believes was last checked during her previous visit when blood and urine tests were conducted. She does not recall the exact timing of her last thyroid  panel.  She is writing a self-help book, which she finds therapeutic. She mentions her sister, a published Chart[employer] loss adjuster, who passed away, and that her book will be published in her sister's honor.  Background Reviewed: Problem List: has Hypothyroidism; Pseudotumor cerebri; Obesity (BMI 30-39.9); Generalized anxiety disorder; Multiple lipomas; Social anxiety disorder; Bilateral kidney stones; Insomnia; ADHD (attention deficit  hyperactivity disorder), combined type; History of cholecystectomy; PTSD (post-traumatic stress disorder); High risk medication use; Herniation of intervertebral disc between L5 and S1; Hyperlipidemia; and Polycythemia on their problem list. Medications:  has a current medication list which includes the following prescription(s): acetazolamide , amphetamine -dextroamphetamine , amphetamine -dextroamphetamine , cetirizine, cyclobenzaprine , hydrochlorothiazide , prazosin , venlafaxine  xr, amphetamine -dextroamphetamine , [START ON 11/16/2023] amphetamine -dextroamphetamine , [START ON 10/17/2023] amphetamine -dextroamphetamine , levothyroxine , wegovy , and venlafaxine  xr.  Allergies:  is allergic to penicillin g, penicillins, and topiramate.  Past Medical History:  has a past medical history of Adult ADHD, Anxiety, Depression, Hyperlipidemia, Hypertension, Irregular heart rate, Migraines, Panic attack, Right ureteral stone (11/04/2018), and Thyroid  disease. Past Surgical History:   has a past surgical history that includes Cholecystectomy (1997); Cesarean section (2009, 2014, 2016); and Breast surgery. Social History:   reports that she has never smoked. She has never used smokeless tobacco. She reports that she does not drink alcohol and does not use drugs. Family History:  family history includes Colon cancer in her maternal grandmother; Diabetes in her father; Heart disease in her father; Hyperlipidemia in her father, maternal grandmother, and mother; Hypertension in her father and mother. Depression Screen and Health Maintenance:    12/13/2022    9:35 AM 09/13/2022    1:38 PM 01/20/2022    1:43 PM 03/10/2021   10:02 AM  PHQ 2/9 Scores  PHQ - 2 Score 0 2 1 2   PHQ- 9 Score 2 4 10 9     Medication Reconciliation: Current Outpatient Medications on File Prior to Visit  Medication Sig   acetaZOLAMIDE  (DIAMOX ) 250 MG tablet TAKE 1 TABLET BY MOUTH DAILY. PLEASE FIND NEW PROVIDER   amphetamine -dextroamphetamine   (  ADDERALL) 10 MG tablet Take 3 times daily   amphetamine -dextroamphetamine  (ADDERALL) 10 MG tablet Take 3 times daily   cetirizine (ZYRTEC) 10 MG chewable tablet Chew 10 mg by mouth daily.   cyclobenzaprine  (FLEXERIL ) 5 MG tablet TAKE 1 TABLET BY MOUTH THREE TIMES A DAY AS NEEDED FOR MUSCLE SPASM.  Limit use to severe spasm   hydrochlorothiazide  (HYDRODIURIL ) 25 MG tablet TAKE 1 TABLET (25 MG TOTAL) BY MOUTH DAILY.   prazosin  (MINIPRESS ) 2 MG capsule TAKE 1 CAPSULE BY MOUTH AT BEDTIME.   Semaglutide -Weight Management (WEGOVY ) 0.25 MG/0.5ML SOAJ Inject 0.25 mg into the skin once a week. (Patient not taking: Reported on 09/17/2023)   No current facility-administered medications on file prior to visit.   Medications Discontinued During This Encounter  Medication Reason   sertraline  (ZOLOFT ) 25 MG tablet Completed Course   levothyroxine  (SYNTHROID ) 100 MCG tablet Reorder   venlafaxine  XR (EFFEXOR -XR) 75 MG 24 hr capsule Reorder   amphetamine -dextroamphetamine  (ADDERALL) 10 MG tablet Reorder     Physical Exam:    09/17/2023    8:48 AM 06/19/2023   12:02 PM 03/15/2023   10:02 AM  Vitals with BMI  Height 5' 6.5"    Weight 253 lbs 3 oz 252 lbs 3 oz   BMI 40.26    Systolic 126 137 098  Diastolic 84 89 81  Pulse 101 88   Vital signs reviewed.  Nursing notes reviewed. Weight trend reviewed. Physical Exam General Appearance: Patient is well-developed, well-nourished, and in no acute distress. Gait and posture appear normal. Pulmonary: Respirations are unlabored; no wheezing, rales, or other abnormal breath sounds noted on auscultation. Neurological: Patient is awake, alert, and oriented to person, place, and time. No focal neurological deficits detected on screening exam. Coordination, muscle strength, and sensation are intact. Psychiatric/Mental Status: Patient's mood is appropriate with a pleasant, calm demeanor. Speech is clear, coherent, and goal-directed. No observable signs of acute  psychosis, mania, or significant anxiety. Substance Misuse Indicators: Pupils are equal, round, and reactive to light. No track marks, skin lesions, or other visible stigmata of substance misuse. Behavior is cooperative and consistent with stable ADHD management, with no signs suggestive of intoxication or withdrawal.     No results found for any visits on 09/17/23. Office Visit on 06/19/2023  Component Date Value   Amphetamines 06/19/2023 NEGATIVE    Barbiturates 06/19/2023 NEGATIVE    Benzodiazepines 06/19/2023 NEGATIVE    Cocaine Metabolite 06/19/2023 NEGATIVE    Marijuana Metabolite 06/19/2023 NEGATIVE    Methadone Metabolite 06/19/2023 NEGATIVE    Opiates 06/19/2023 NEGATIVE    Oxycodone  06/19/2023 NEGATIVE    Creatinine 06/19/2023 137.4    pH 06/19/2023 6.6    Oxidant 06/19/2023 NEGATIVE    Notes and Comments 06/19/2023    Office Visit on 12/13/2022  Component Date Value   Cholesterol 12/13/2022 247 (H)    Triglycerides 12/13/2022 134.0    HDL 12/13/2022 64.10    VLDL 12/13/2022 26.8    LDL Cholesterol 12/13/2022 156 (H)    Total CHOL/HDL Ratio 12/13/2022 4    NonHDL 12/13/2022 182.63    TSH 12/13/2022 5.33    Sodium 12/13/2022 136    Potassium 12/13/2022 3.5    Chloride 12/13/2022 100    CO2 12/13/2022 27    Glucose, Bld 12/13/2022 109 (H)    BUN 12/13/2022 14    Creatinine, Ser 12/13/2022 0.78    Total Bilirubin 12/13/2022 0.4    Alkaline Phosphatase 12/13/2022 105    AST 12/13/2022 18  ALT 12/13/2022 15    Total Protein 12/13/2022 7.6    Albumin 12/13/2022 4.1    GFR 12/13/2022 89.28    Calcium 12/13/2022 9.5    WBC 12/13/2022 7.4    RBC 12/13/2022 5.18 (H)    Hemoglobin 12/13/2022 15.7 (H)    HCT 12/13/2022 47.2 (H)    MCV 12/13/2022 91.0    MCHC 12/13/2022 33.2    RDW 12/13/2022 13.0    Platelets 12/13/2022 415.0 (H)    Neutrophils Relative % 12/13/2022 59.1    Lymphocytes Relative 12/13/2022 29.6    Monocytes Relative 12/13/2022 5.1     Eosinophils Relative 12/13/2022 5.2 (H)    Basophils Relative 12/13/2022 1.0    Neutro Abs 12/13/2022 4.4    Lymphs Abs 12/13/2022 2.2    Monocytes Absolute 12/13/2022 0.4    Eosinophils Absolute 12/13/2022 0.4    Basophils Absolute 12/13/2022 0.1    Free T4 12/13/2022 0.81   No image results found. No results found.    Results LABS Urine culture: Hematuria Drug screen: Negative    Assessment & Plan ADHD (attention deficit hyperactivity disorder), combined type ADHD is well-controlled with Adderall, and the current regimen is effective. Send a 90-day supply of Adderall 10 mg, three times a day, [facility] High risk medication use PDMP not reviewed this encounter. Urine drug screen recently normal. Acquired hypothyroidism Her hypothyroidism is well-managed. A recent thyroid  panel was performed, though specific results were not discussed. Refill Synthroid  for one year. Generalized anxiety disorder Her Generalized Anxiety Disorder is well-managed despite recent stressors, such as a custody battle. Zoloft  was discontinued due to side effects, and Effexor  is currently used for management, also helping to reduce hot flashes. Increase Effexor  XR to 112.5 mg daily by adding a 37.5 mg pill to the current 75 mg dose. Perimenopausal She is experiencing menopausal symptoms, including hot flashes, with menopause confirmed by seven months of amenorrhea. Fibromyalgia muscle pain Increase Effexor  more for anxiety, perimenopausal symptom(s).        Orders Placed During this Encounter:  No orders of the defined types were placed in this encounter.  Meds ordered this encounter  Medications   amphetamine -dextroamphetamine  (ADDERALL) 10 MG tablet    Sig: Take 3 times daily    Dispense:  90 tablet    Refill:  0   levothyroxine  (SYNTHROID ) 100 MCG tablet    Sig: Take 1 tablet (100 mcg total) by mouth daily before breakfast.    Dispense:  90 tablet    Refill:  3    venlafaxine  XR (EFFEXOR  XR) 37.5 MG 24 hr capsule    Sig: Take 1 capsule (37.5 mg total) by mouth daily with breakfast.    Dispense:  90 capsule    Refill:  3   venlafaxine  XR (EFFEXOR -XR) 75 MG 24 hr capsule    Sig: TAKE 1 CAPSULE BY MOUTH EVERY DAY WITH BREAKFAST    Dispense:  90 capsule    Refill:  3   amphetamine -dextroamphetamine  (ADDERALL) 10 MG tablet    Sig: Take 3 times daily    Dispense:  90 tablet    Refill:  0   amphetamine -dextroamphetamine  (ADDERALL) 10 MG tablet    Sig: Take 3 times daily    Dispense:  90 tablet    Refill:  0      This document was synthesized by artificial intelligence (Abridge) using HIPAA-compliant recording of the clinical interaction;   We discussed the use of AI scribe software for clinical  note transcription with the patient, who gave verbal consent to proceed. additional Info: This encounter employed state-of-the-art, real-time, collaborative documentation. The patient actively reviewed and assisted in updating their electronic medical record on a shared screen, ensuring transparency and facilitating joint problem-solving for the problem list, overview, and plan. This approach promotes accurate, informed care. The treatment plan was discussed and reviewed in detail, including medication safety, potential side effects, and all patient questions. We confirmed understanding and comfort with the plan. Follow-up instructions were established, including contacting the office for any concerns, returning if symptoms worsen, persist, or new symptoms develop, and precautions for potential emergency department visits.

## 2023-09-17 NOTE — Assessment & Plan Note (Signed)
 Her Generalized Anxiety Disorder is well-managed despite recent stressors, such as a custody battle. Zoloft  was discontinued due to side effects, and Effexor  is currently used for management, also helping to reduce hot flashes. Increase Effexor  XR to 112.5 mg daily by adding a 37.5 mg pill to the current 75 mg dose.

## 2023-09-17 NOTE — Assessment & Plan Note (Signed)
 Her hypothyroidism is well-managed. A recent thyroid  panel was performed, though specific results were not discussed. Refill Synthroid  for one year.

## 2023-09-17 NOTE — Assessment & Plan Note (Signed)
 PDMP not reviewed this encounter. Urine drug screen recently normal.

## 2023-09-18 ENCOUNTER — Other Ambulatory Visit: Payer: Self-pay | Admitting: Internal Medicine

## 2023-09-18 DIAGNOSIS — G8929 Other chronic pain: Secondary | ICD-10-CM

## 2023-11-02 ENCOUNTER — Telehealth: Payer: Self-pay

## 2023-11-02 ENCOUNTER — Other Ambulatory Visit (HOSPITAL_COMMUNITY): Payer: Self-pay

## 2023-11-02 NOTE — Telephone Encounter (Signed)
 Pharmacy Patient Advocate Encounter   Received notification from Onbase that prior authorization for Wegovy  0.25MG /0.5ML auto-injectors is required/requested.   Insurance verification completed.   The patient is insured through [facility] .   Per test claim: PA required; PA submitted to above mentioned insurance via CoverMyMeds Key/confirmation #/EOC [id number] Status is pending

## 2023-11-02 NOTE — Telephone Encounter (Signed)
 Pharmacy Patient Advocate Encounter  Received notification from [facility] that Prior Authorization for Wegovy  0.25MG /0.5ML has been APPROVED from 10/03/23 to 04/30/24   PA #/Case ID/Reference #: [id number]

## 2023-11-23 ENCOUNTER — Other Ambulatory Visit: Payer: Self-pay | Admitting: Internal Medicine

## 2023-11-23 DIAGNOSIS — G8929 Other chronic pain: Secondary | ICD-10-CM

## 2023-12-05 IMAGING — CR DG LUMBAR SPINE COMPLETE 4+V
5 series · 5 of 5 positions shown · non-contrast
Comparison: Lumbar spine 02/20/2018.

CLINICAL DATA: Chronic low back pain.

EXAM:
LUMBAR SPINE - COMPLETE 4+ VIEW

[t lumbar spine ap]
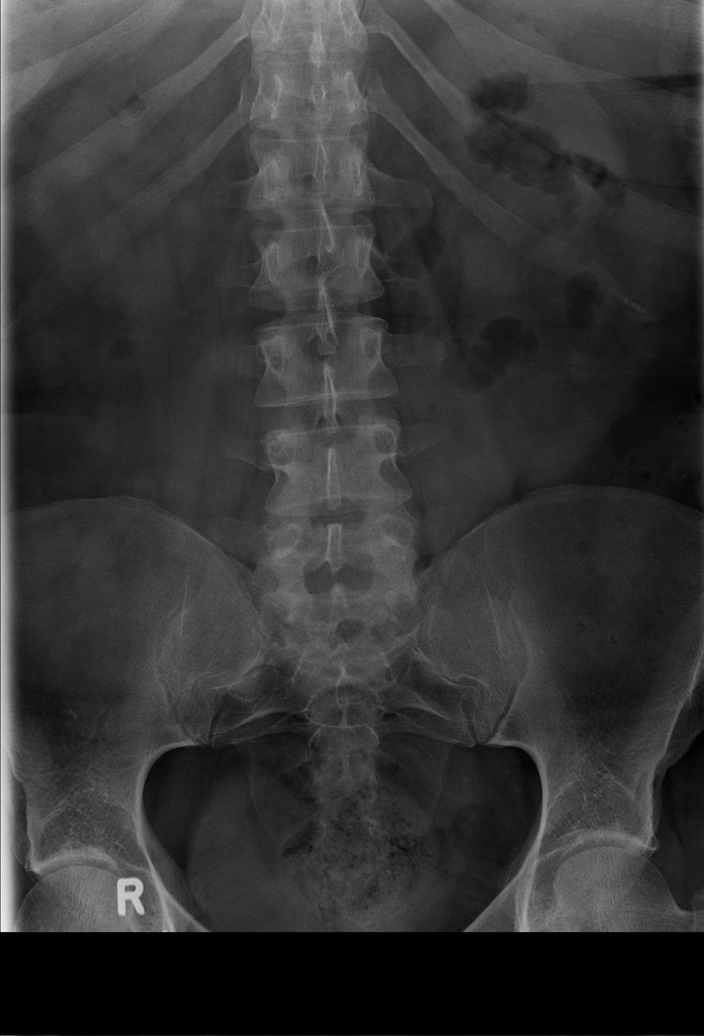

[t lumbar spine obl (1 of 2)]
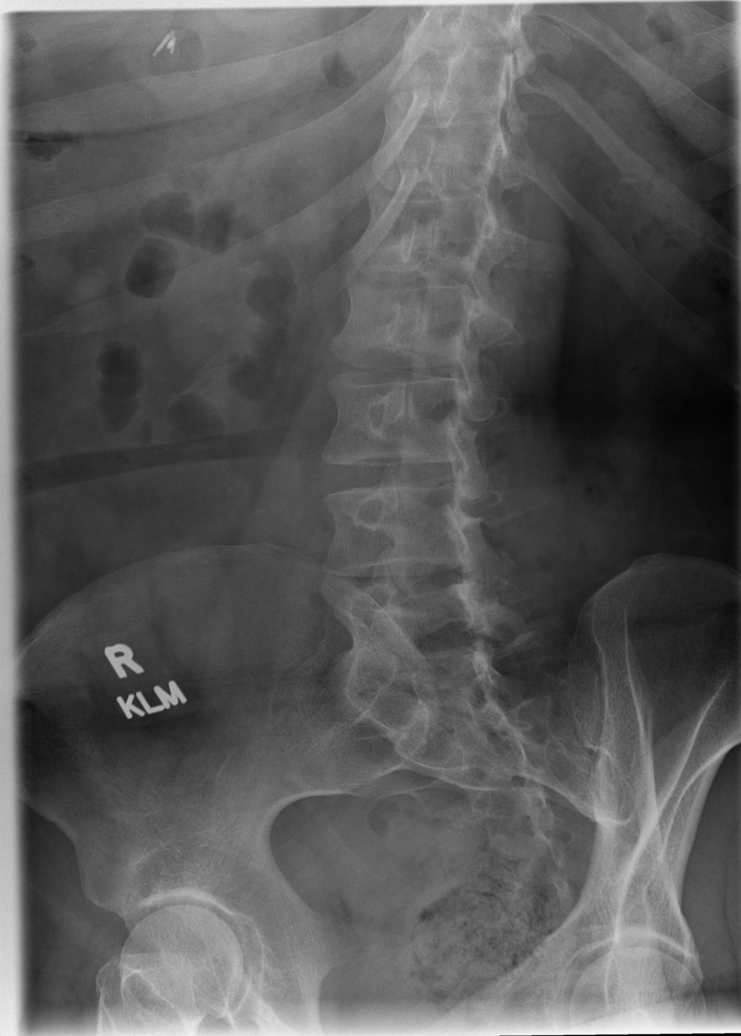

[t lumbar spine obl (2 of 2)]
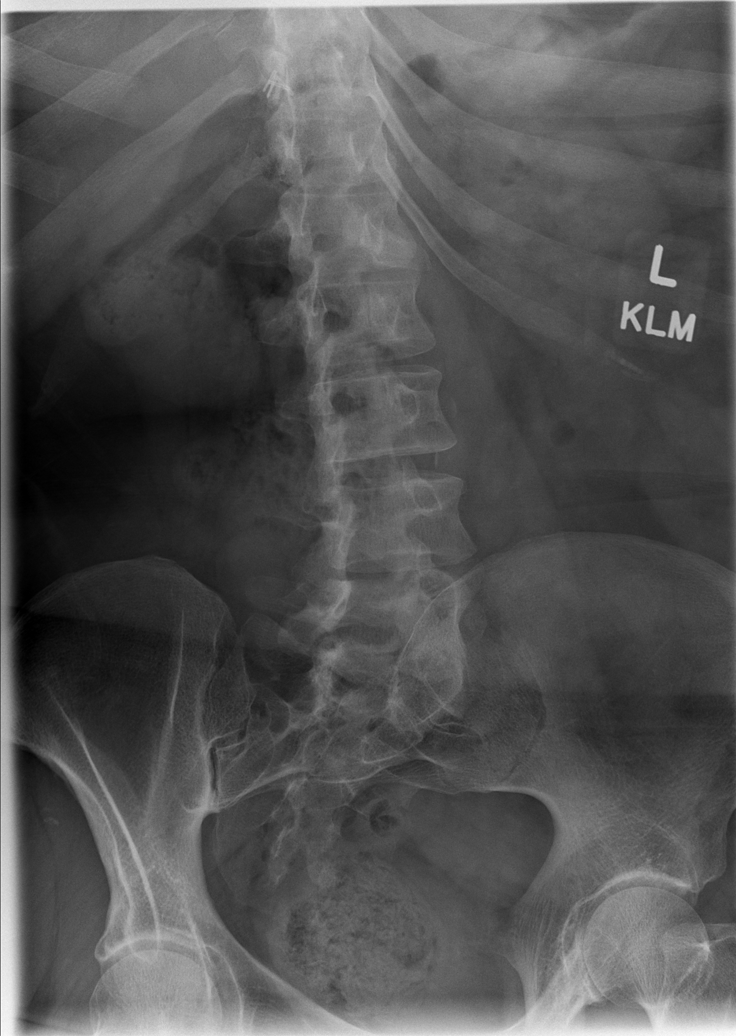

[t lumbar spine lat]
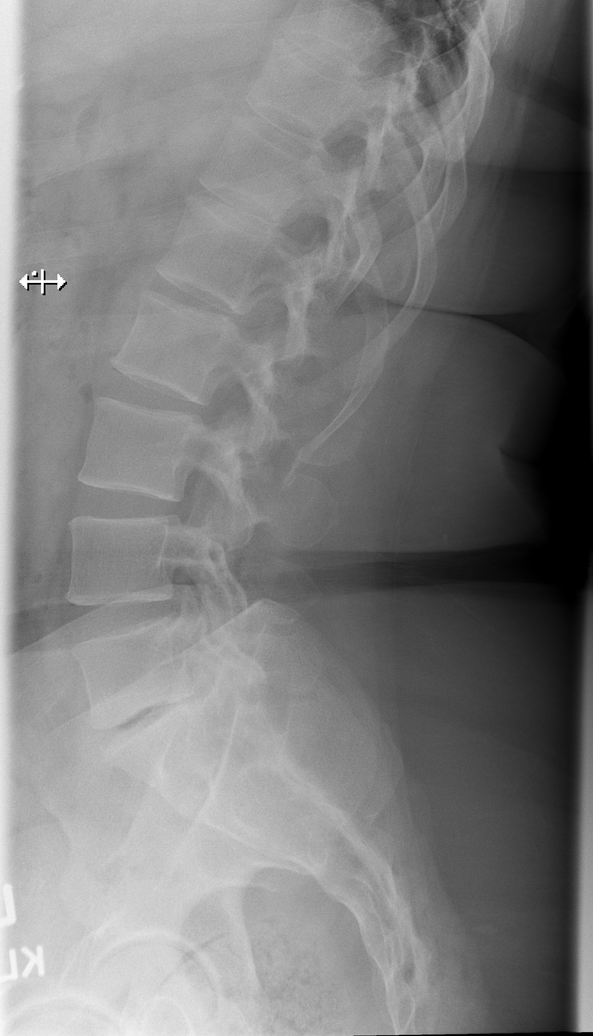

[t lumbar l-5 s-1 spot]
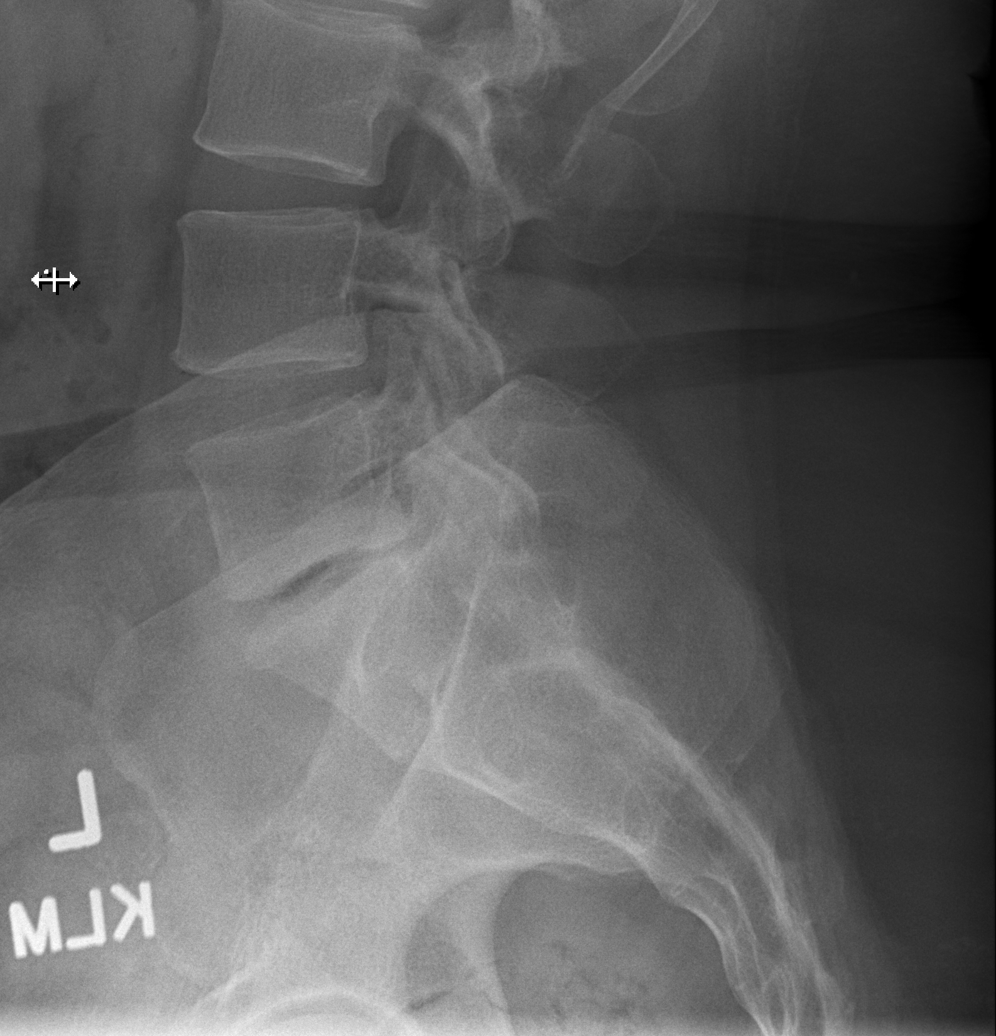

[5 of 5 positions shown; findings below may reference images not displayed]

FINDINGS: Spinal alignment is within normal limits. There is moderate disc
space narrowing at L5-S1 which has progressed. Disc spaces are
otherwise well maintained. No acute fracture. Soft tissues are
within normal limits.
IMPRESSION: Moderate degenerative narrowing of the L5-S1 disc space has
progressed compared to the prior study.

## 2023-12-17 ENCOUNTER — Ambulatory Visit: Admitting: Internal Medicine

## 2023-12-18 ENCOUNTER — Other Ambulatory Visit: Payer: Self-pay | Admitting: Internal Medicine

## 2023-12-18 DIAGNOSIS — F908 Attention-deficit hyperactivity disorder, other type: Secondary | ICD-10-CM

## 2023-12-18 NOTE — Telephone Encounter (Unsigned)
 Copied from CRM [id number]. Topic: Clinical - Medication Refill >> Dec 18, 2023  4:20 PM Chiquita SQUIBB wrote: Medication: amphetamine -dextroamphetamine  (ADDERALL) 10 MG tablet [[id number]]  Has the patient contacted their pharmacy? Yes (Agent: If no, request that the patient contact the pharmacy for the refill. If patient does not wish to contact the pharmacy document the reason why and proceed with request.) (Agent: If yes, when and what did the pharmacy advise?)  This is the patient's preferred pharmacy:  [id number] [place] [place] - [address] [place] [address] Phone: [phone] Fax: [phone]  Is this the correct pharmacy for this prescription? Yes If no, delete pharmacy and type the correct one.   Has the prescription been filled recently? No  Is the patient out of the medication? Yes- Patient has been out for two days.  Has the patient been seen for an appointment in the last year OR does the patient have an upcoming appointment? Yes- Patient missed her last appointment but it scheduled for the first available.  Can we respond through MyChart? No  Agent: Please be advised that Rx refills may take up to 3 business days. We ask that you follow-up with your pharmacy.

## 2023-12-19 ENCOUNTER — Encounter: Payer: Self-pay | Admitting: Internal Medicine

## 2023-12-19 ENCOUNTER — Telehealth (INDEPENDENT_AMBULATORY_CARE_PROVIDER_SITE_OTHER): Admitting: Internal Medicine

## 2023-12-19 DIAGNOSIS — F5104 Psychophysiologic insomnia: Secondary | ICD-10-CM | POA: Diagnosis not present

## 2023-12-19 DIAGNOSIS — F908 Attention-deficit hyperactivity disorder, other type: Secondary | ICD-10-CM

## 2023-12-19 DIAGNOSIS — F902 Attention-deficit hyperactivity disorder, combined type: Secondary | ICD-10-CM | POA: Diagnosis not present

## 2023-12-19 DIAGNOSIS — Z79899 Other long term (current) drug therapy: Secondary | ICD-10-CM

## 2023-12-19 MED ORDER — AMPHETAMINE-DEXTROAMPHETAMINE 10 MG PO TABS: ORAL_TABLET | ORAL | 0 refills | Status: AC

## 2023-12-19 MED ORDER — AMPHETAMINE-DEXTROAMPHETAMINE 10 MG PO TABS: ORAL_TABLET | ORAL | 0 refills | Status: DC

## 2023-12-19 MED ORDER — PRAZOSIN HCL 2 MG PO CAPS
2.0000 mg | ORAL_CAPSULE | Freq: Every day | ORAL | 3 refills | Status: AC
Start: 2023-12-19 — End: ?

## 2023-12-19 NOTE — Assessment & Plan Note (Signed)
 The patient continues to demonstrate excellent control of ADHD symptoms on her established regimen of Adderall 10 mg three times daily. She reports sustained benefit with this dose, with no significant side effects or evidence of misuse/diversion. She has a well-documented history of stable response and adherence to the medication, including consistent pill counts and compliance with diversion prevention protocols. Previous trials of extended-release formulations were not tolerated. There is no indication of sleep disturbance or other adverse effects, as long as dosing is appropriately timed. She remains highly functional and satisfied with her treatment plan.  Plan: Continue Adderall 10 mg TID Prescribed a 90-day supply, dispensed as three separate 30-day prescriptions per regulatory guidelines. Reinforce importance of not taking late doses to avoid sleep disruption. Ongoing Monitoring Continue monthly pill counts and/or photo documentation of remaining medication to support safe use and diversion prevention. Periodic urine drug screens to verify adherence and absence of non-prescribed substances. Review and renew controlled substance agreement annually or as required.  PDMP reviewed during this encounter.  No abnormal activity. Side Effect and Efficacy Surveillance Continue to monitor for potential side effects (insomnia, appetite suppression, mood changes, cardiovascular symptoms). Reassess benefit and tolerability at each visit; encourage patient to report any concerns promptly. Patient Education Reviewed safe storage and handling of stimulant medication in AVS Discussed risks of misuse/diversion and the importance of adherence to prescribed regimen. Follow-up Routine follow-up in 3 months or sooner as needed. Patient instructed to contact clinic with any new or concerning symptoms. Summary: ADHD remains well controlled on current therapy. No evidence of misuse, diversion, or side effects.  Continue current regimen with appropriate monitoring and safety precautions.

## 2023-12-19 NOTE — Progress Notes (Signed)
 ====================================  [facility]: [phone]   --  Virtual Video Medical Office Visit --  Patient: Cassidy Hopkins      Age: 48 y.o.       Sex:  female  Date:   12/19/2023 Today's Healthcare Provider: Bernardino KANDICE Cone, MD  ====================================    Chief Complaint/Reason For Visit: Medication Refill Attention Deficit Hyperactivity Disorder (ADHD) medication(s) management  Chart reviewed: has Hypothyroidism; Pseudotumor cerebri; Obesity (BMI 30-39.9); Generalized anxiety disorder; Multiple lipomas; Social anxiety disorder; Bilateral kidney stones; Insomnia; ADHD (attention deficit hyperactivity disorder), combined type; History of cholecystectomy; PTSD (post-traumatic stress disorder); High risk medication use; Herniation of intervertebral disc between L5 and S1; Hyperlipidemia; and Polycythemia on their problem list..  Chart reviewed:  has a past medical history of Adult ADHD, Anxiety, Depression, Hyperlipidemia, Hypertension, Irregular heart rate, Migraines, Panic attack, Right ureteral stone (11/04/2018), and Thyroid  disease. Discussed the use of AI scribe software for clinical note transcription with the patient, who gave verbal consent to proceed.  History of Present Illness Patient reports she is continuing to take Adderall 10 g 3 times daily and working overnight shifts that she does not mind because she is a night owl.  She denies any problems with difficulty sleeping and has her room set up for it to sleep in the daytime.  She reports the medication is working well and does not need to adjust it.  However she forgot to refill the prazosin  when she ran out a few months ago and she did find it helpful for any leftover anxiety that sometimes be at the end of shifts so she would like that to be refilled.  She is also taking her Effexor  regularly and is going up on the dose since we last spoke.  She is not reporting any new  issues reports she is taking the medication as prescribed    Medications reviewed Current Outpatient Medications on File Prior to Visit  Medication Sig   acetaZOLAMIDE  (DIAMOX ) 250 MG tablet TAKE 1 TABLET BY MOUTH DAILY. PLEASE FIND NEW PROVIDER   cetirizine (ZYRTEC) 10 MG chewable tablet Chew 10 mg by mouth daily.   cyclobenzaprine  (FLEXERIL ) 5 MG tablet TAKE 1 TABLET BY MOUTH THREE TIMES A DAY AS NEEDED FOR MUSCLE SPASM. LIMIT USE TO SEVERE SPASM   hydrochlorothiazide  (HYDRODIURIL ) 25 MG tablet TAKE 1 TABLET (25 MG TOTAL) BY MOUTH DAILY.   levothyroxine  (SYNTHROID ) 100 MCG tablet Take 1 tablet (100 mcg total) by mouth daily before breakfast.   venlafaxine  XR (EFFEXOR  XR) 37.5 MG 24 hr capsule Take 1 capsule (37.5 mg total) by mouth daily with breakfast.   amphetamine -dextroamphetamine  (ADDERALL) 10 MG tablet Take 3 times daily   amphetamine -dextroamphetamine  (ADDERALL) 10 MG tablet Take 3 times daily   Semaglutide -Weight Management (WEGOVY ) 0.25 MG/0.5ML SOAJ Inject 0.25 mg into the skin once a week. (Patient not taking: Reported on 09/17/2023)   venlafaxine  XR (EFFEXOR -XR) 75 MG 24 hr capsule TAKE 1 CAPSULE BY MOUTH EVERY DAY WITH BREAKFAST   No current facility-administered medications on file prior to visit.   Medications Discontinued During This Encounter  Medication Reason   prazosin  (MINIPRESS ) 2 MG capsule Reorder   amphetamine -dextroamphetamine  (ADDERALL) 10 MG tablet Reorder   amphetamine -dextroamphetamine  (ADDERALL) 10 MG tablet Reorder   amphetamine -dextroamphetamine  (ADDERALL) 10 MG tablet Reorder        Exam Context: Evaluation limited by virtual format; however, patient is clearly visualized, cooperative, and engaged throughout. General Appearance: Well-developed, well-nourished; no acute distress by  limited video assessment. Pulmonary: No respiratory distress apparent; normal work of breathing observed. Neurological: Patient is awake, alert, and demonstrates no  obvious focal neurological deficits or cognitive impairments; sensorium appears unclouded. Psychiatric/Mental Status: Mood is appropriate; demeanor is pleasant, calm, and articulate. Speech is coherent and goal-directed with no evidence of slurred or pressured speech. No abnormal psychomotor activity noted. Substance Misuse Indicators: Pupils appear symmetric and reactive as far as can be assessed via video. No track marks, skin lesions, or other stigmata of substance misuse visible. No signs of intoxication or withdrawal are evident.        No results found for any visits on 12/19/23. Office Visit on 06/19/2023  Component Date Value   Amphetamines 06/19/2023 NEGATIVE    Barbiturates 06/19/2023 NEGATIVE    Benzodiazepines 06/19/2023 NEGATIVE    Cocaine Metabolite 06/19/2023 NEGATIVE    Marijuana Metabolite 06/19/2023 NEGATIVE    Methadone Metabolite 06/19/2023 NEGATIVE    Opiates 06/19/2023 NEGATIVE    Oxycodone  06/19/2023 NEGATIVE    Creatinine 06/19/2023 137.4    pH 06/19/2023 6.6    Oxidant 06/19/2023 NEGATIVE    Notes and Comments 06/19/2023    No image results found. No results found.No results found.     Assessment & Plan ADHD (attention deficit hyperactivity disorder), combined type Other specified attention deficit hyperactivity disorder (ADHD) Psychophysiological insomnia High risk medication use The patient continues to demonstrate excellent control of ADHD symptoms on her established regimen of Adderall 10 mg three times daily. She reports sustained benefit with this dose, with no significant side effects or evidence of misuse/diversion. She has a well-documented history of stable response and adherence to the medication, including consistent pill counts and compliance with diversion prevention protocols. Previous trials of extended-release formulations were not tolerated. There is no indication of sleep disturbance or other adverse effects, as long as dosing is  appropriately timed. She remains highly functional and satisfied with her treatment plan.  Plan: Continue Adderall 10 mg TID Prescribed a 90-day supply, dispensed as three separate 30-day prescriptions per regulatory guidelines. Reinforce importance of not taking late doses to avoid sleep disruption. Ongoing Monitoring Continue monthly pill counts and/or photo documentation of remaining medication to support safe use and diversion prevention. Periodic urine drug screens to verify adherence and absence of non-prescribed substances. Review and renew controlled substance agreement annually or as required.  PDMP reviewed during this encounter.  No abnormal activity. Side Effect and Efficacy Surveillance Continue to monitor for potential side effects (insomnia, appetite suppression, mood changes, cardiovascular symptoms). Reassess benefit and tolerability at each visit; encourage patient to report any concerns promptly. Patient Education Reviewed safe storage and handling of stimulant medication in AVS Discussed risks of misuse/diversion and the importance of adherence to prescribed regimen. Follow-up Routine follow-up in 3 months or sooner as needed. Patient instructed to contact clinic with any new or concerning symptoms. Summary: ADHD remains well controlled on current therapy. No evidence of misuse, diversion, or side effects. Continue current regimen with appropriate monitoring and safety precautions.       Orders Placed During this Encounter:  No orders of the defined types were placed in this encounter.  Meds ordered this encounter  Medications   amphetamine -dextroamphetamine  (ADDERALL) 10 MG tablet    Sig: Take 3 times daily    Dispense:  90 tablet    Refill:  0   amphetamine -dextroamphetamine  (ADDERALL) 10 MG tablet    Sig: Take 3 times daily    Dispense:  90 tablet    Refill:  0   amphetamine -dextroamphetamine  (ADDERALL) 10 MG tablet    Sig: Take 3 times daily    Dispense:   90 tablet    Refill:  0   prazosin  (MINIPRESS ) 2 MG capsule    Sig: Take 1 capsule (2 mg total) by mouth at bedtime.    Dispense:  90 capsule    Refill:  3    Treatment plan discussed and reviewed in detail. Explained medication safety and potential side effects.  Answered all patient questions and confirmed understanding and comfort with the plan. Encouraged patient to contact our office if they have any questions or concerns.  Agreed on patient coming for a sooner office visit if symptoms worsen, persist, or new symptoms develop. Discussed precautions in case of needing to visit the Emergency Department.    ----------------------------------------------------- Attestation:  Today's Healthcare Provider Bernardino KANDICE Cone, MD was located at office at [facility] [address] [place] [address].  The patient was located at home. All video encounter participant identities and locations confirmed visually and verbally.Today's Telemedicine visit was conducted via synchronous Video after consent for telemedicine was obtained:  Video connection was never lost    This document was transcribed and resynthesized, in part, by artificial intelligence ([employer]) using HIPAA-compliant recording of the clinical interaction;   We have discussed the our use of AI scribe software for clinical note transcription with the patient, who has given verbal consent to proceed.

## 2023-12-19 NOTE — Assessment & Plan Note (Addendum)
 The patient continues to demonstrate excellent control of ADHD symptoms on her established regimen of Adderall 10 mg three times daily. She reports sustained benefit with this dose, with no significant side effects or evidence of misuse/diversion. She has a well-documented history of stable response and adherence to the medication, including consistent pill counts and compliance with diversion prevention protocols. Previous trials of extended-release formulations were not tolerated. There is no indication of sleep disturbance or other adverse effects, as long as dosing is appropriately timed. She remains highly functional and satisfied with her treatment plan.  Plan: Continue Adderall 10 mg TID Prescribed a 90-day supply, dispensed as three separate 30-day prescriptions per regulatory guidelines. Reinforce importance of not taking late doses to avoid sleep disruption. Ongoing Monitoring Continue monthly pill counts and/or photo documentation of remaining medication to support safe use and diversion prevention. Periodic urine drug screens to verify adherence and absence of non-prescribed substances. Review and renew controlled substance agreement annually or as required.  PDMP reviewed during this encounter.  No abnormal activity. Side Effect and Efficacy Surveillance Continue to monitor for potential side effects (insomnia, appetite suppression, mood changes, cardiovascular symptoms). Reassess benefit and tolerability at each visit; encourage patient to report any concerns promptly. Patient Education Reviewed safe storage and handling of stimulant medication in AVS Discussed risks of misuse/diversion and the importance of adherence to prescribed regimen. Follow-up Routine follow-up in 3 months or sooner as needed. Patient instructed to contact clinic with any new or concerning symptoms. Summary: ADHD remains well controlled on current therapy. No evidence of misuse, diversion, or side effects.  Continue current regimen with appropriate monitoring and safety precautions.

## 2023-12-20 ENCOUNTER — Encounter: Payer: Self-pay | Admitting: Internal Medicine

## 2023-12-21 ENCOUNTER — Other Ambulatory Visit (HOSPITAL_COMMUNITY): Payer: Self-pay

## 2023-12-30 ENCOUNTER — Other Ambulatory Visit: Payer: Self-pay | Admitting: Internal Medicine

## 2023-12-30 DIAGNOSIS — G8929 Other chronic pain: Secondary | ICD-10-CM

## 2024-01-09 ENCOUNTER — Ambulatory Visit: Admitting: Internal Medicine

## 2024-01-29 ENCOUNTER — Encounter: Payer: Self-pay | Admitting: Internal Medicine

## 2024-02-20 ENCOUNTER — Other Ambulatory Visit: Payer: Self-pay | Admitting: Internal Medicine

## 2024-02-20 DIAGNOSIS — G8929 Other chronic pain: Secondary | ICD-10-CM

## 2024-02-25 DIAGNOSIS — R062 Wheezing: Secondary | ICD-10-CM | POA: Diagnosis not present

## 2024-02-25 DIAGNOSIS — R509 Fever, unspecified: Secondary | ICD-10-CM | POA: Diagnosis not present

## 2024-02-25 DIAGNOSIS — B356 Tinea cruris: Secondary | ICD-10-CM | POA: Diagnosis not present

## 2024-02-25 DIAGNOSIS — R82998 Other abnormal findings in urine: Secondary | ICD-10-CM | POA: Diagnosis not present

## 2024-03-20 ENCOUNTER — Ambulatory Visit: Admitting: Internal Medicine

## 2024-03-20 ENCOUNTER — Encounter: Payer: Self-pay | Admitting: Internal Medicine

## 2024-03-20 VITALS — BP 122/82 | HR 98 | Temp 98.0°F | Ht 66.5 in | Wt 250.4 lb

## 2024-03-20 DIAGNOSIS — F902 Attention-deficit hyperactivity disorder, combined type: Secondary | ICD-10-CM

## 2024-03-20 DIAGNOSIS — Z780 Asymptomatic menopausal state: Secondary | ICD-10-CM | POA: Diagnosis not present

## 2024-03-20 DIAGNOSIS — E782 Mixed hyperlipidemia: Secondary | ICD-10-CM

## 2024-03-20 DIAGNOSIS — L659 Nonscarring hair loss, unspecified: Secondary | ICD-10-CM | POA: Insufficient documentation

## 2024-03-20 MED ORDER — AMPHETAMINE-DEXTROAMPHETAMINE 10 MG PO TABS: ORAL_TABLET | ORAL | 0 refills | Status: DC

## 2024-03-20 MED ORDER — AMPHETAMINE-DEXTROAMPHETAMINE 10 MG PO TABS: ORAL_TABLET | ORAL | 0 refills | Status: AC

## 2024-03-20 NOTE — Assessment & Plan Note (Signed)
 She has a high stroke risk due to elevated cholesterol and a family history of hyperlipidemia and hypertension. Incorporate dietary changes to manage cholesterol levels. Recommend dietary intake of superfoods such as nuts, extra virgin olive oil, and avocado. Advise against synthetic fats and recommend using real butter instead of substitutes. The 10-year ASCVD risk score (Arnett DK, et al., 2019) is: 1.7%   Values used to calculate the score:     Age: 48 years     Clincally relevant sex: Female     Is Non-Hispanic African American: No     Diabetic: No     Tobacco smoker: No     Systolic Blood Pressure: 122 mmHg     Is BP treated: Yes     HDL Cholesterol: 64.1 mg/dL     Total Cholesterol: 247 mg/dL

## 2024-03-20 NOTE — Assessment & Plan Note (Signed)
 She is experiencing menopausal symptoms, primarily hair loss, and is eight months since her last menstrual cycle. Concerned about hair loss, she declines estrogen replacement therapy due to the associated risks of stroke and blood clots, especially given her high stroke risk from hyperlipidemia. Alternative options like Nutrafol are discussed, noting it is less effective than estrogen but safe with no side effects. She opts for non-hormonal management of hair loss.

## 2024-03-20 NOTE — Patient Instructions (Signed)
 It was a pleasure seeing you today! Your health and satisfaction are our top priorities.  Cassidy Cone, MD  VISIT SUMMARY: During your visit, we discussed your current medications, concerns about menopausal symptoms, and reviewed your overall health. You are managing your ADHD well with your current medication, and we addressed your concerns about hair loss related to menopause. We also discussed your cholesterol levels and ways to manage them through dietary changes.  YOUR PLAN: -MIXED HYPERLIPIDEMIA: Mixed hyperlipidemia means you have high levels of cholesterol and triglycerides in your blood, which increases your risk of stroke. We recommend incorporating dietary changes to manage your cholesterol levels, such as eating superfoods like nuts, extra virgin olive oil, and avocado, and avoiding synthetic fats. Use real butter instead of substitutes.  -MENOPAUSAL SYMPTOMS WITH NONSCARRING HAIR LOSS: You are experiencing menopausal symptoms, primarily hair loss, and it has been eight months since your last menstrual cycle. Due to the risks associated with estrogen replacement therapy, we discussed alternative options like Nutrafol, which is less effective but safe with no side effects. You opted for non-hormonal management of your hair loss.  -ATTENTION DEFICIT HYPERACTIVITY DISORDER, COMBINED TYPE: ADHD is a condition that affects your ability to focus and control impulses. You are currently managing it well with Adderall 10 mg three times daily. We will continue this dosage and provide a 90-day supply. Please submit monthly pill counts to monitor your medication adherence.  INSTRUCTIONS: Please follow up with us  in three months to review your progress with the dietary changes and hair loss management. Continue taking your ADHD medication as prescribed and submit monthly pill counts. If you have any new symptoms or concerns, contact our office.  Your Providers PCP: Hopkins Cassidy MATSU, MD,   [phone]) Referring Provider: Cone Cassidy MATSU, MD,  [phone]) Care Team Provider: Watt Rush, MD,  [phone])  NEXT STEPS: [x]  Early Intervention: Schedule sooner appointment, call our on-call services, or go to emergency room if there is any significant Increase in pain or discomfort New or worsening symptoms Sudden or severe changes in your health [x]  Flexible Follow-Up: We recommend a No follow-ups on file. for optimal routine care. This allows for progress monitoring and treatment adjustments. [x]  Preventive Care: Schedule your annual preventive care visit! It's typically covered by insurance and helps identify potential health issues early. [x]  Lab & X-ray Appointments: Incomplete tests scheduled today, or call to schedule. X-rays: [facility] (M-F, 8:30am-noon or 1pm-5pm). [x]  Medical Information Release: Sign a release form at front desk to obtain relevant medical information we don't have.  MAKING THE MOST OF OUR FOCUSED 20 MINUTE APPOINTMENTS: [x]   Clearly state your top concerns at the beginning of the visit to focus our discussion [x]   If you anticipate you will need more time, please inform the front desk during scheduling - we can book multiple appointments in the same week. [x]   If you have transportation problems- use our convenient video appointments or ask about transportation support. [x]   We can get down to business faster if you use MyChart to update information before the visit and submit non-urgent questions before your visit. Thank you for taking the time to provide details through MyChart.  Let our nurse know and she can import this information into your encounter documents.  Arrival and Wait Times: [x]   Arriving on time ensures that everyone receives prompt attention. [x]   Early morning (8a) and afternoon (1p) appointments tend to have shortest wait times. [x]   Unfortunately, we cannot delay  appointments for late arrivals or hold slots  during phone calls.  Getting Answers and Following Up [x]   Simple Questions & Concerns: For quick questions or basic follow-up after your visit, reach us  at [phone] or MyChart messaging. [x]   Complex Concerns: If your concern is more complex, scheduling an appointment might be best. Discuss this with the staff to find the most suitable option. [x]   Lab & Imaging Results: We'll contact you directly if results are abnormal or you don't use MyChart. Most normal results will be on MyChart within 2-3 business days, with a review message from Dr. Jesus. Haven't heard back in 2 weeks? Need results sooner? Contact us  at [phone]. [x]   Referrals: Our referral coordinator will manage specialist referrals. The specialist's office should contact you within 2 weeks to schedule an appointment. Call us  if you haven't heard from them after 2 weeks.  Staying Connected [x]   MyChart: Activate your MyChart for the fastest way to access results and message us . See the last page of this paperwork for instructions on how to activate.  Bring to Your Next Appointment [x]   Medications: Please bring all your medication bottles to your next appointment to ensure we have an accurate record of your prescriptions. [x]   Health Diaries: If you're monitoring any health conditions at home, keeping a diary of your readings can be very helpful for discussions at your next appointment.  Billing [x]   X-ray & Lab Orders: These are billed by separate companies. Contact the invoicing company directly for questions or concerns. [x]   Visit Charges: Discuss any billing inquiries with our administrative services team.  Your Satisfaction Matters [x]   Share Your Experience: We strive for your satisfaction! If you have any complaints, or preferably compliments, please let Dr. Jesus know directly or contact our Practice Administrators, Manuelita Rubin or [employer], by asking at the front desk.   Reviewing Your  Records [x]   Review this early draft of your clinical encounter notes below and the final encounter summary tomorrow on MyChart after its been completed.  All orders placed so far are visible here: ADHD (attention deficit hyperactivity disorder), combined type -     Amphetamine -Dextroamphetamine ; Take 3 times daily  Dispense: 90 tablet; Refill: 0 -     Amphetamine -Dextroamphetamine ; Take 3 times daily  Dispense: 90 tablet; Refill: 0 -     Amphetamine -Dextroamphetamine ; Take 3 times daily  Dispense: 90 tablet; Refill: 0  Hair loss  Postmenopausal estrogen deficiency  Moderate mixed hyperlipidemia not requiring statin therapy

## 2024-03-20 NOTE — Progress Notes (Signed)
 ==============================  [facility]: [phone]   -- Medical Office Visit --  Patient: Cassidy Hopkins      Age: 48 y.o.       Sex:  female  Date:   03/20/2024 Today's Healthcare Provider: Bernardino KANDICE Cone, MD  ==============================   Chief Complaint: ADHD and Medication Refill  Discussed the use of AI scribe software for clinical note transcription with the patient, who gave verbal consent to proceed.  History of Present Illness 48 year old female who presents for medication management and concerns about menopausal symptoms.  She is currently taking Adderall 10 mg three times a day for ADHD, which she has been on for a long time without issues. She reports being able to maintain employment while on her current medication regimen. No appetite changes, weight loss, or anxiety related to the medication. She maintains decent sleep and does not take drug holidays.  She reports starting menopause, being eight months since her last menstrual cycle, and experiencing significant hair loss, which is her primary concern. No hot flashes, night sweats, vaginal pain, or dryness, but she notes some mood swings. No family history of breast cancer and no abnormal endometrial bleeding issues.  Her social history includes being employed and having a husband who works for [employer]. She expresses concerns about societal issues and the impact on her family, particularly her children.  She has a family history of high cholesterol and high blood pressure, with her mother having had these conditions despite a healthy lifestyle. Since the last visit has the patient had any: Appetite changes? No Unintentional weight loss? No Is medication working well ? Yes Does patient take drug holidays? No Difficulties falling to sleep or maintaining sleep? No Any anxiety?  Not from the medicine but from societal breakdown. Any cardiac issues (fainting or  paliptations)? No Suicidal thoughts? No Changes in health since last visit? No New medications? No Any illicit substance abuse? No Has the patient taken his medication today? Yes  Background Reviewed: Problem List: has Hypothyroidism; Pseudotumor cerebri; Obesity (BMI 30-39.9); Generalized anxiety disorder; Multiple lipomas; Social anxiety disorder; Bilateral kidney stones; Insomnia; ADHD (attention deficit hyperactivity disorder), combined type; History of cholecystectomy; PTSD (post-traumatic stress disorder); High risk medication use; Herniation of intervertebral disc between L5 and S1; Hyperlipidemia; Polycythemia; and Hair loss on their problem list. Past Medical History:  has a past medical history of Adult ADHD, Anxiety, Depression, Hyperlipidemia, Hypertension, Irregular heart rate, Migraines, Panic attack, Right ureteral stone (11/04/2018), and Thyroid  disease. Past Surgical History:   has a past surgical history that includes Cholecystectomy (1997); Cesarean section (2009, 2014, 2016); and Breast surgery. Social History:   reports that she has never smoked. She has never used smokeless tobacco. She reports that she does not drink alcohol and does not use drugs. Family History:  family history includes Colon cancer in her maternal grandmother; Diabetes in her father; Heart disease in her father; Hyperlipidemia in her father, maternal grandmother, and mother; Hypertension in her father and mother. Allergies:  is allergic to penicillin g, penicillins, and topiramate.   Medication Reconciliation: Current Outpatient Medications on File Prior to Visit  Medication Sig   acetaZOLAMIDE  (DIAMOX ) 250 MG tablet TAKE 1 TABLET BY MOUTH DAILY. PLEASE FIND NEW PROVIDER   amphetamine -dextroamphetamine  (ADDERALL) 10 MG tablet Take 3 times daily   amphetamine -dextroamphetamine  (ADDERALL) 10 MG tablet Take 3 times daily   cetirizine (ZYRTEC) 10 MG chewable tablet Chew 10 mg by mouth daily.  cyclobenzaprine  (FLEXERIL ) 5 MG tablet TAKE 1 TABLET BY MOUTH THREE TIMES A DAY AS NEEDED FOR MUSCLE SPASM. LIMIT USE TO SEVERE SPASM   hydrochlorothiazide  (HYDRODIURIL ) 25 MG tablet TAKE 1 TABLET (25 MG TOTAL) BY MOUTH DAILY.   levothyroxine  (SYNTHROID ) 100 MCG tablet Take 1 tablet (100 mcg total) by mouth daily before breakfast.   prazosin  (MINIPRESS ) 2 MG capsule Take 1 capsule (2 mg total) by mouth at bedtime.   Semaglutide -Weight Management (WEGOVY ) 0.25 MG/0.5ML SOAJ Inject 0.25 mg into the skin once a week. (Patient not taking: Reported on 09/17/2023)   venlafaxine  XR (EFFEXOR  XR) 37.5 MG 24 hr capsule Take 1 capsule (37.5 mg total) by mouth daily with breakfast.   venlafaxine  XR (EFFEXOR -XR) 75 MG 24 hr capsule TAKE 1 CAPSULE BY MOUTH EVERY DAY WITH BREAKFAST   No current facility-administered medications on file prior to visit.   Medications Discontinued During This Encounter  Medication Reason   amphetamine -dextroamphetamine  (ADDERALL) 10 MG tablet Reorder   amphetamine -dextroamphetamine  (ADDERALL) 10 MG tablet Reorder   amphetamine -dextroamphetamine  (ADDERALL) 10 MG tablet Reorder     Physical Exam:    03/20/2024    8:27 AM 09/17/2023    8:48 AM 06/19/2023   12:02 PM  Vitals with BMI  Height 5' 6.5 5' 6.5   Weight 250 lbs 6 oz 253 lbs 3 oz 252 lbs 3 oz  BMI 39.81 40.26   Systolic 122 126 862  Diastolic 82 84 89  Pulse 98 101 88  Vital signs reviewed.  Nursing notes reviewed. Weight trend reviewed. Physical Activity: Insufficiently Active (03/13/2024)   Exercise Vital Sign    Days of Exercise per Week: 2 days    Minutes of Exercise per Session: 20 min   General Appearance: Patient is well-developed, well-nourished, and in no acute distress. Gait and posture appear normal. Pulmonary: Respirations are unlabored; no wheezing, rales, or other abnormal breath sounds noted on auscultation. Neurological: Patient is awake, alert, and oriented to person, place, and time. No  focal neurological deficits detected on screening exam. Coordination, muscle strength, and sensation are intact. Psychiatric/Mental Status: Patient's mood is appropriate with a pleasant, calm demeanor. Speech is clear, coherent, and goal-directed. No observable signs of acute psychosis, mania, or significant anxiety. Substance Misuse Indicators: Pupils are equal, round, and reactive to light. No track marks, skin lesions, or other visible stigmata of substance misuse. Behavior is cooperative and consistent with stable ADHD management, with no signs suggestive of intoxication or withdrawal.       03/20/2024    8:30 AM 12/13/2022    9:35 AM 09/13/2022    1:38 PM 01/20/2022    1:43 PM  PHQ 2/9 Scores  PHQ - 2 Score 0 0 2 1  PHQ- 9 Score  2 4 10     {   No results found for any visits on 03/20/24.} Office Visit on 06/19/2023  Component Date Value Ref Range Status   Amphetamines 06/19/2023 NEGATIVE  <500 ng/mL Final   Barbiturates 06/19/2023 NEGATIVE  <300 ng/mL Final   Benzodiazepines 06/19/2023 NEGATIVE  <100 ng/mL Final   Cocaine Metabolite 06/19/2023 NEGATIVE  <150 ng/mL Final   Marijuana Metabolite 06/19/2023 NEGATIVE  <20 ng/mL Final   Methadone Metabolite 06/19/2023 NEGATIVE  <100 ng/mL Final   Opiates 06/19/2023 NEGATIVE  <100 ng/mL Final   Oxycodone  06/19/2023 NEGATIVE  <100 ng/mL Final   Creatinine 06/19/2023 137.4  > or = 20.0 mg/dL Final   pH 98/71/7974 6.6  4.5 - 9.0 Final   Oxidant  06/19/2023 NEGATIVE  <200 mcg/mL Final   Notes and Comments 06/19/2023    Final  Office Visit on 12/13/2022  Component Date Value Ref Range Status   Cholesterol 12/13/2022 247 (H)  0 - 200 mg/dL Final   Triglycerides 92/75/7975 134.0  0.0 - 149.0 mg/dL Final   HDL 92/75/7975 64.10  >39.00 mg/dL Final   VLDL 92/75/7975 26.8  0.0 - 40.0 mg/dL Final   LDL Cholesterol 12/13/2022 156 (H)  0 - 99 mg/dL Final   Total CHOL/HDL Ratio 12/13/2022 4   Final   NonHDL 12/13/2022 182.63   Final   TSH  12/13/2022 5.33  0.35 - 5.50 uIU/mL Final   Sodium 12/13/2022 136  135 - 145 mEq/L Final   Potassium 12/13/2022 3.5  3.5 - 5.1 mEq/L Final   Chloride 12/13/2022 100  96 - 112 mEq/L Final   CO2 12/13/2022 27  19 - 32 mEq/L Final   Glucose, Bld 12/13/2022 109 (H)  70 - 99 mg/dL Final   BUN 92/75/7975 14  6 - 23 mg/dL Final   Creatinine, Ser 12/13/2022 0.78  0.40 - 1.20 mg/dL Final   Total Bilirubin 12/13/2022 0.4  0.2 - 1.2 mg/dL Final   Alkaline Phosphatase 12/13/2022 105  39 - 117 U/L Final   AST 12/13/2022 18  0 - 37 U/L Final   ALT 12/13/2022 15  0 - 35 U/L Final   Total Protein 12/13/2022 7.6  6.0 - 8.3 g/dL Final   Albumin 92/75/7975 4.1  3.5 - 5.2 g/dL Final   GFR 92/75/7975 89.28  >60.00 mL/min Final   Calcium 12/13/2022 9.5  8.4 - 10.5 mg/dL Final   WBC 92/75/7975 7.4  4.0 - 10.5 K/uL Final   RBC 12/13/2022 5.18 (H)  3.87 - 5.11 Mil/uL Final   Hemoglobin 12/13/2022 15.7 (H)  12.0 - 15.0 g/dL Final   HCT 92/75/7975 47.2 (H)  36.0 - 46.0 % Final   MCV 12/13/2022 91.0  78.0 - 100.0 fl Final   MCHC 12/13/2022 33.2  30.0 - 36.0 g/dL Final   RDW 92/75/7975 13.0  11.5 - 15.5 % Final   Platelets 12/13/2022 415.0 (H)  150.0 - 400.0 K/uL Final   Neutrophils Relative % 12/13/2022 59.1  43.0 - 77.0 % Final   Lymphocytes Relative 12/13/2022 29.6  12.0 - 46.0 % Final   Monocytes Relative 12/13/2022 5.1  3.0 - 12.0 % Final   Eosinophils Relative 12/13/2022 5.2 (H)  0.0 - 5.0 % Final   Basophils Relative 12/13/2022 1.0  0.0 - 3.0 % Final   Neutro Abs 12/13/2022 4.4  1.4 - 7.7 K/uL Final   Lymphs Abs 12/13/2022 2.2  0.7 - 4.0 K/uL Final   Monocytes Absolute 12/13/2022 0.4  0.1 - 1.0 K/uL Final   Eosinophils Absolute 12/13/2022 0.4  0.0 - 0.7 K/uL Final   Basophils Absolute 12/13/2022 0.1  0.0 - 0.1 K/uL Final   Free T4 12/13/2022 0.81  0.60 - 1.60 ng/dL Final  Office Visit on 04/24/2022  Component Date Value Ref Range Status   Amphetamines, Urine 04/24/2022 Positive (A)  Cutoff=1000 Final    Cannabinoid Quant, Ur 04/24/2022 Negative  Cutoff=50 ng/mL Final   Cocaine (Metab.) 04/24/2022 Negative  Cutoff=300 ng/mL Final   OPIATE QUANTITATIVE URINE 04/24/2022 Negative  Cutoff=2000 ng/mL Final   PCP Quant, Ur 04/24/2022 Negative  Cutoff=25 ng/mL Final  No image results found. No results found.      ASSESSMENT & PLAN   Assessment & Plan Hair loss Postmenopausal estrogen  deficiency She is experiencing menopausal symptoms, primarily hair loss, and is eight months since her last menstrual cycle. Concerned about hair loss, she declines estrogen replacement therapy due to the associated risks of stroke and blood clots, especially given her high stroke risk from hyperlipidemia. Alternative options like Nutrafol are discussed, noting it is less effective than estrogen but safe with no side effects. She opts for non-hormonal management of hair loss. Moderate mixed hyperlipidemia not requiring statin therapy She has a high stroke risk due to elevated cholesterol and a family history of hyperlipidemia and hypertension. Incorporate dietary changes to manage cholesterol levels. Recommend dietary intake of superfoods such as nuts, extra virgin olive oil, and avocado. Advise against synthetic fats and recommend using real butter instead of substitutes. The 10-year ASCVD risk score (Arnett DK, et al., 2019) is: 1.7%   Values used to calculate the score:     Age: 9 years     Clincally relevant sex: Female     Is Non-Hispanic African American: No     Diabetic: No     Tobacco smoker: No     Systolic Blood Pressure: 122 mmHg     Is BP treated: Yes     HDL Cholesterol: 64.1 mg/dL     Total Cholesterol: 247 mg/dL  ADHD (attention deficit hyperactivity disorder), combined type ADHD is managed with Adderall 10 mg three times daily. She reports no issues with the current dose, which effectively maintains her employment. Continue Adderall 10 mg oral tid. Provide a 90-day supply as 3 times 30 days  supplies, and request monthly pill count submissions to monitor medication adherence. PDMP reviewed during this encounter. She has adhered to requested pill count submissions and we have never had concern(s) misuse. Negative urine drug screen 05/2023   ORDER ASSOCIATIONS  #   DIAGNOSIS / CONDITION ICD-10 ENCOUNTER ORDER     ICD-10-CM   1. ADHD (attention deficit hyperactivity disorder), combined type  F90.2 amphetamine -dextroamphetamine  (ADDERALL) 10 MG tablet    amphetamine -dextroamphetamine  (ADDERALL) 10 MG tablet    amphetamine -dextroamphetamine  (ADDERALL) 10 MG tablet    2. Hair loss  L65.9     3. Postmenopausal estrogen deficiency  Z78.0     4. Moderate mixed hyperlipidemia not requiring statin therapy  E78.2        Meds ordered this encounter  Medications   amphetamine -dextroamphetamine  (ADDERALL) 10 MG tablet    Sig: Take 3 times daily    Dispense:  90 tablet    Refill:  0   amphetamine -dextroamphetamine  (ADDERALL) 10 MG tablet    Sig: Take 3 times daily    Dispense:  90 tablet    Refill:  0   amphetamine -dextroamphetamine  (ADDERALL) 10 MG tablet    Sig: Take 3 times daily    Dispense:  90 tablet    Refill:  0       This document was synthesized by artificial intelligence ([employer]bridge) using HIPAA-compliant recording of the clinical interaction;   We discussed the use of AI scribe software for clinical note transcription with the patient, who gave verbal consent to proceed. additional Info: This encounter employed state-of-the-art, real-time, collaborative documentation. The patient actively reviewed and assisted in updating their electronic medical record on a shared screen, ensuring transparency and facilitating joint problem-solving for the problem list, overview, and plan. This approach promotes accurate, informed care. The treatment plan was discussed and reviewed in detail, including medication safety, potential side effects, and all patient questions. We confirmed  understanding and comfort with the plan.  Follow-up instructions were established, including contacting the office for any concerns, returning if symptoms worsen, persist, or new symptoms develop, and precautions for potential emergency department visits.

## 2024-03-20 NOTE — Assessment & Plan Note (Signed)
 ADHD is managed with Adderall 10 mg three times daily. She reports no issues with the current dose, which effectively maintains her employment. Continue Adderall 10 mg oral tid. Provide a 90-day supply as 3 times 30 days supplies, and request monthly pill count submissions to monitor medication adherence. PDMP reviewed during this encounter. She has adhered to requested pill count submissions and we have never had concern(s) misuse. Negative urine drug screen 05/2023

## 2024-04-20 ENCOUNTER — Other Ambulatory Visit: Payer: Self-pay | Admitting: Internal Medicine

## 2024-04-20 DIAGNOSIS — G8929 Other chronic pain: Secondary | ICD-10-CM

## 2024-04-30 ENCOUNTER — Other Ambulatory Visit (HOSPITAL_COMMUNITY): Payer: Self-pay

## 2024-06-18 ENCOUNTER — Encounter: Payer: Self-pay | Admitting: Internal Medicine

## 2024-06-20 ENCOUNTER — Encounter: Payer: Self-pay | Admitting: Internal Medicine

## 2024-06-20 ENCOUNTER — Telehealth: Admitting: Internal Medicine

## 2024-06-20 DIAGNOSIS — F902 Attention-deficit hyperactivity disorder, combined type: Secondary | ICD-10-CM | POA: Diagnosis not present

## 2024-06-20 DIAGNOSIS — Z79899 Other long term (current) drug therapy: Secondary | ICD-10-CM

## 2024-06-20 MED ORDER — AMPHETAMINE-DEXTROAMPHETAMINE 10 MG PO TABS: ORAL_TABLET | ORAL | 0 refills | Status: AC

## 2024-06-20 MED ORDER — GUANFACINE HCL ER 1 MG PO TB24: 1.0000 mg | ORAL_TABLET | Freq: Every day | ORAL | 2 refills | Status: AC

## 2024-06-20 MED ORDER — ATOMOXETINE HCL 18 MG PO CAPS: 18.0000 mg | ORAL_CAPSULE | Freq: Every day | ORAL | 2 refills | Status: AC

## 2024-06-20 NOTE — Patient Instructions (Signed)
 It was a pleasure seeing you today! Your health and satisfaction are our top priorities.  Bernardino Cone, MD  VISIT SUMMARY: Cassidy Hopkins, you visited today for ADHD medication management. You have been managing your ADHD with Adderall, taking it three times a day without issues from the pharmacy or side effects. You have previously tried Vyvanse  and Concerta  but did not have a good experience with them. You are currently out of your Adderall prescription and plan to pick it up from the pharmacy.  YOUR PLAN: -ADHD (ATTENTION DEFICIT HYPERACTIVITY DISORDER), COMBINED TYPE: ADHD is a condition that affects focus, self-control, and other important skills. You have been managing your ADHD with Adderall 10 mg three times daily, despite experiencing the peak and crash effect. We discussed trying Strattera  and guanfacine  to help smooth out Adderall's effects and manage potential anxiety. Strattera  is a non-stimulant that may enhance focus, while guanfacine  may reduce anxiety by blocking adrenaline. Both medications are affordable and can be trialed. We also discussed trying a nicotinamide riboside supplement to improve metabolism and energy levels. Continue taking Adderall 10 mg three times daily and pick up your prescription from the pharmacy. We will prescribe Strattera  and guanfacine  for you to trial.  INSTRUCTIONS: Please continue taking Adderall 10 mg three times daily and pick up your prescription from the pharmacy. We will prescribe Strattera  and guanfacine  for you to trial. Additionally, consider trying a nicotinamide riboside supplement to improve metabolism and energy levels. Follow up with us  to discuss how these new medications and supplements are working for you.  Your Providers PCP: Cone Bernardino MATSU, MD,  [phone]) Referring Provider: Cone Bernardino MATSU, MD,  [phone]) Care Team Provider: Watt Rush, MD,  [phone])  NEXT STEPS: [x]  Early Intervention: Schedule sooner appointment,  call our on-call services, or go to emergency room if there is any significant Increase in pain or discomfort New or worsening symptoms Sudden or severe changes in your health [x]  Flexible Follow-Up: We recommend a Return in about 3 months (around 09/18/2024). for optimal routine care. This allows for progress monitoring and treatment adjustments. [x]  Preventive Care: Schedule your annual preventive care visit! It's typically covered by insurance and helps identify potential health issues early. [x]  Lab & X-ray Appointments: Incomplete tests scheduled today, or call to schedule. X-rays: [facility] (M-F, 8:30am-noon or 1pm-5pm). [x]  Medical Information Release: Sign a release form at front desk to obtain relevant medical information we don't have.  MAKING THE MOST OF OUR FOCUSED 20 MINUTE APPOINTMENTS: [x]   Clearly state your top concerns at the beginning of the visit to focus our discussion [x]   If you anticipate you will need more time, please inform the front desk during scheduling - we can book multiple appointments in the same week. [x]   If you have transportation problems- use our convenient video appointments or ask about transportation support. [x]   We can get down to business faster if you use MyChart to update information before the visit and submit non-urgent questions before your visit. Thank you for taking the time to provide details through MyChart.  Let our nurse know and she can import this information into your encounter documents.  Arrival and Wait Times: [x]   Arriving on time ensures that everyone receives prompt attention. [x]   Early morning (8a) and afternoon (1p) appointments tend to have shortest wait times. [x]   Unfortunately, we cannot delay appointments for late arrivals or hold slots during phone calls.  Getting Answers and Following Up [x]   Simple Questions &  Concerns: For quick questions or basic follow-up after your visit, reach us  at [phone]  or MyChart messaging. [x]   Complex Concerns: If your concern is more complex, scheduling an appointment might be best. Discuss this with the staff to find the most suitable option. [x]   Lab & Imaging Results: We'll contact you directly if results are abnormal or you don't use MyChart. Most normal results will be on MyChart within 2-3 business days, with a review message from Dr. Jesus. Haven't heard back in 2 weeks? Need results sooner? Contact us  at [phone]. [x]   Referrals: Our referral coordinator will manage specialist referrals. The specialist's office should contact you within 2 weeks to schedule an appointment. Call us  if you haven't heard from them after 2 weeks.  Staying Connected [x]   MyChart: Activate your MyChart for the fastest way to access results and message us . See the last page of this paperwork for instructions on how to activate.  Bring to Your Next Appointment [x]   Medications: Please bring all your medication bottles to your next appointment to ensure we have an accurate record of your prescriptions. [x]   Health Diaries: If you're monitoring any health conditions at home, keeping a diary of your readings can be very helpful for discussions at your next appointment.  Billing [x]   X-ray & Lab Orders: These are billed by separate companies. Contact the invoicing company directly for questions or concerns. [x]   Visit Charges: Discuss any billing inquiries with our administrative services team.  Your Satisfaction Matters [x]   Share Your Experience: We strive for your satisfaction! If you have any complaints, or preferably compliments, please let Dr. Jesus know directly or contact our Practice Administrators, Manuelita Rubin or [employer], by asking at the front desk.   Reviewing Your Records [x]   Review this early draft of your clinical encounter notes below and the final encounter summary tomorrow on MyChart after its been completed.  All orders placed so far are  visible here: ADHD (attention deficit hyperactivity disorder), combined type -     Amphetamine -Dextroamphetamine ; Take 3 times daily  Dispense: 90 tablet; Refill: 0 -     Amphetamine -Dextroamphetamine ; Take 3 times daily  Dispense: 90 tablet; Refill: 0 -     Amphetamine -Dextroamphetamine ; Take 3 times daily  Dispense: 90 tablet; Refill: 0 -     Atomoxetine  HCl; Take 1 capsule (18 mg total) by mouth daily.  Dispense: 30 capsule; Refill: 2 -     guanFACINE  HCl ER; Take 1 tablet (1 mg total) by mouth daily.  Dispense: 30 tablet; Refill: 2  High risk medication use -     Amphetamine -Dextroamphetamine ; Take 3 times daily  Dispense: 90 tablet; Refill: 0 -     Amphetamine -Dextroamphetamine ; Take 3 times daily  Dispense: 90 tablet; Refill: 0 -     Amphetamine -Dextroamphetamine ; Take 3 times daily  Dispense: 90 tablet; Refill: 0 -     Atomoxetine  HCl; Take 1 capsule (18 mg total) by mouth daily.  Dispense: 30 capsule; Refill: 2 -     guanFACINE  HCl ER; Take 1 tablet (1 mg total) by mouth daily.  Dispense: 30 tablet; Refill: 2         Nicotinamide riboside (NR) is a potent, orally available vitamin  B3cap B sub 3 ??3 precursor that boosts NAD+ levels to support cellular energy, metabolism, and healthy aging. By replenishing declining NAD+, it may offer benefits for cardiovascular, metabolic, and neurological health. Generally safe, typical doses are   250--300 mg250 -- 300 mg 250--300  mg daily, with minor reported side effects like indigestion.  Key Aspects of Nicotinamide Riboside (NR) and NAD+  Role in NAD+ Production: NR is a direct precursor to Nicotinamide Adenine Dinucleotide (NAD+), a coenzyme essential for mitochondrial function and cellular energy production. It is converted into NAD+ via NR kinases. Potential Health Benefits: Anti-Aging & Metabolism: Boosts NAD+ to enhance mitochondrial function, potentially improving insulin  sensitivity, muscular health, and  endurance. Neuroprotection: Studies suggest potential to improve cognitive function, decrease amyloid-beta production, and reduce neurological deterioration. Cardiovascular Health: May assist in reversing damage related to vascular aging. Dosage and Safety: Commonly taken at   250--300 mg250 -- 300 mg 250--300 mg per day. It is considered well-tolerated, though some users may experience mild nausea, fatigue, headache, or diarrhea. Availability: Found in very small quantities in food, but primarily taken as supplements (e.g., Tru Niagen).  Differences from Other Vitamin   B3cap B sub 3 ??3 Forms  Unlike niacin, NR does not cause flushing. It is considered a more efficient precursor for elevating NAD+ compared to nicotinamide.

## 2024-06-22 ENCOUNTER — Encounter: Payer: Self-pay | Admitting: Internal Medicine

## 2024-06-22 ENCOUNTER — Other Ambulatory Visit: Payer: Self-pay | Admitting: Internal Medicine

## 2024-06-22 DIAGNOSIS — G8929 Other chronic pain: Secondary | ICD-10-CM

## 2024-06-22 NOTE — Assessment & Plan Note (Signed)
 ADHD is managed with Adderall 10 mg three times daily. She is accustomed to the regimen despite the peak and crash effect. Newer medications like Vyvanse  and Aptensio  were ineffective. To smooth out Adderall's effects and manage potential anxiety, Strattera  and guanfacine  are considered. Strattera , a non-stimulant, may enhance focus, while guanfacine  may reduce anxiety by blocking adrenaline. Both are affordable and can be trialed. Nicotinamide riboside was discussed as a supplement to improve metabolism and energy levels. Continue Adderall 10 mg three times daily. Prescribe Strattera  and guanfacine  for trial. Advise trying nicotinamide riboside supplement.
